# Patient Record
Sex: Female | Born: 1980 | Race: Black or African American | Hispanic: No | Smoking: Current some day smoker
Health system: Southern US, Community
[De-identification: ages and names within clinical notes are randomized; demographics above are authoritative.]

## PROBLEM LIST (undated history)

## (undated) DIAGNOSIS — T7840XA Allergy, unspecified, initial encounter: Secondary | ICD-10-CM

## (undated) DIAGNOSIS — R12 Heartburn: Secondary | ICD-10-CM

## (undated) DIAGNOSIS — F419 Anxiety disorder, unspecified: Secondary | ICD-10-CM

## (undated) DIAGNOSIS — G4730 Sleep apnea, unspecified: Secondary | ICD-10-CM

## (undated) DIAGNOSIS — F32A Depression, unspecified: Secondary | ICD-10-CM

## (undated) DIAGNOSIS — G43909 Migraine, unspecified, not intractable, without status migrainosus: Secondary | ICD-10-CM

## (undated) DIAGNOSIS — K219 Gastro-esophageal reflux disease without esophagitis: Secondary | ICD-10-CM

## (undated) HISTORY — PX: CHOLECYSTECTOMY: SHX55

## (undated) HISTORY — DX: Anxiety disorder, unspecified: F41.9

## (undated) HISTORY — DX: Allergy, unspecified, initial encounter: T78.40XA

## (undated) HISTORY — DX: Migraine, unspecified, not intractable, without status migrainosus: G43.909

## (undated) HISTORY — DX: Depression, unspecified: F32.A

## (undated) HISTORY — DX: Heartburn: R12

## (undated) HISTORY — PX: HERNIA REPAIR: SHX51

---

## 2014-12-22 HISTORY — PX: TUMOR REMOVAL: SHX12

## 2017-12-21 HISTORY — PX: ANTERIOR CRUCIATE LIGAMENT REPAIR: SHX115

## 2018-01-03 DIAGNOSIS — S83512A Sprain of anterior cruciate ligament of left knee, initial encounter: Secondary | ICD-10-CM | POA: Diagnosis not present

## 2018-01-05 DIAGNOSIS — M25562 Pain in left knee: Secondary | ICD-10-CM | POA: Diagnosis not present

## 2018-01-05 DIAGNOSIS — S83512A Sprain of anterior cruciate ligament of left knee, initial encounter: Secondary | ICD-10-CM | POA: Diagnosis not present

## 2018-01-05 DIAGNOSIS — M6588 Other synovitis and tenosynovitis, other site: Secondary | ICD-10-CM | POA: Diagnosis not present

## 2018-01-05 DIAGNOSIS — G8918 Other acute postprocedural pain: Secondary | ICD-10-CM | POA: Diagnosis not present

## 2018-01-05 DIAGNOSIS — Y929 Unspecified place or not applicable: Secondary | ICD-10-CM | POA: Diagnosis not present

## 2018-01-05 DIAGNOSIS — W19XXXA Unspecified fall, initial encounter: Secondary | ICD-10-CM | POA: Diagnosis not present

## 2018-01-07 DIAGNOSIS — S83512A Sprain of anterior cruciate ligament of left knee, initial encounter: Secondary | ICD-10-CM | POA: Diagnosis not present

## 2018-01-11 DIAGNOSIS — M7989 Other specified soft tissue disorders: Secondary | ICD-10-CM | POA: Diagnosis not present

## 2018-01-11 DIAGNOSIS — M79662 Pain in left lower leg: Secondary | ICD-10-CM | POA: Diagnosis not present

## 2018-01-13 DIAGNOSIS — S83512A Sprain of anterior cruciate ligament of left knee, initial encounter: Secondary | ICD-10-CM | POA: Diagnosis not present

## 2018-01-13 DIAGNOSIS — M25562 Pain in left knee: Secondary | ICD-10-CM | POA: Diagnosis not present

## 2018-01-14 DIAGNOSIS — S83512A Sprain of anterior cruciate ligament of left knee, initial encounter: Secondary | ICD-10-CM | POA: Diagnosis not present

## 2018-01-17 DIAGNOSIS — S83512A Sprain of anterior cruciate ligament of left knee, initial encounter: Secondary | ICD-10-CM | POA: Diagnosis not present

## 2018-01-17 DIAGNOSIS — M25562 Pain in left knee: Secondary | ICD-10-CM | POA: Diagnosis not present

## 2018-01-20 DIAGNOSIS — S83512D Sprain of anterior cruciate ligament of left knee, subsequent encounter: Secondary | ICD-10-CM | POA: Diagnosis not present

## 2018-01-20 DIAGNOSIS — M25562 Pain in left knee: Secondary | ICD-10-CM | POA: Diagnosis not present

## 2018-01-25 DIAGNOSIS — S83512D Sprain of anterior cruciate ligament of left knee, subsequent encounter: Secondary | ICD-10-CM | POA: Diagnosis not present

## 2018-01-27 DIAGNOSIS — M25562 Pain in left knee: Secondary | ICD-10-CM | POA: Diagnosis not present

## 2018-01-27 DIAGNOSIS — S83512D Sprain of anterior cruciate ligament of left knee, subsequent encounter: Secondary | ICD-10-CM | POA: Diagnosis not present

## 2018-01-31 DIAGNOSIS — S83512D Sprain of anterior cruciate ligament of left knee, subsequent encounter: Secondary | ICD-10-CM | POA: Diagnosis not present

## 2018-01-31 DIAGNOSIS — M25562 Pain in left knee: Secondary | ICD-10-CM | POA: Diagnosis not present

## 2018-02-03 DIAGNOSIS — S83512D Sprain of anterior cruciate ligament of left knee, subsequent encounter: Secondary | ICD-10-CM | POA: Diagnosis not present

## 2018-02-03 DIAGNOSIS — M25562 Pain in left knee: Secondary | ICD-10-CM | POA: Diagnosis not present

## 2018-02-08 DIAGNOSIS — S83512D Sprain of anterior cruciate ligament of left knee, subsequent encounter: Secondary | ICD-10-CM | POA: Diagnosis not present

## 2018-02-08 DIAGNOSIS — M25562 Pain in left knee: Secondary | ICD-10-CM | POA: Diagnosis not present

## 2018-02-22 DIAGNOSIS — S83512D Sprain of anterior cruciate ligament of left knee, subsequent encounter: Secondary | ICD-10-CM | POA: Diagnosis not present

## 2018-02-22 DIAGNOSIS — M25562 Pain in left knee: Secondary | ICD-10-CM | POA: Diagnosis not present

## 2018-02-24 DIAGNOSIS — S83512D Sprain of anterior cruciate ligament of left knee, subsequent encounter: Secondary | ICD-10-CM | POA: Diagnosis not present

## 2018-02-24 DIAGNOSIS — M25562 Pain in left knee: Secondary | ICD-10-CM | POA: Diagnosis not present

## 2018-03-01 DIAGNOSIS — M25562 Pain in left knee: Secondary | ICD-10-CM | POA: Diagnosis not present

## 2018-03-01 DIAGNOSIS — S83512D Sprain of anterior cruciate ligament of left knee, subsequent encounter: Secondary | ICD-10-CM | POA: Diagnosis not present

## 2018-03-08 DIAGNOSIS — M25562 Pain in left knee: Secondary | ICD-10-CM | POA: Diagnosis not present

## 2018-03-08 DIAGNOSIS — S83512D Sprain of anterior cruciate ligament of left knee, subsequent encounter: Secondary | ICD-10-CM | POA: Diagnosis not present

## 2018-03-10 DIAGNOSIS — S83512D Sprain of anterior cruciate ligament of left knee, subsequent encounter: Secondary | ICD-10-CM | POA: Diagnosis not present

## 2018-03-10 DIAGNOSIS — M25562 Pain in left knee: Secondary | ICD-10-CM | POA: Diagnosis not present

## 2018-03-17 DIAGNOSIS — M25562 Pain in left knee: Secondary | ICD-10-CM | POA: Diagnosis not present

## 2018-03-17 DIAGNOSIS — S83512D Sprain of anterior cruciate ligament of left knee, subsequent encounter: Secondary | ICD-10-CM | POA: Diagnosis not present

## 2018-03-31 DIAGNOSIS — S83512D Sprain of anterior cruciate ligament of left knee, subsequent encounter: Secondary | ICD-10-CM | POA: Diagnosis not present

## 2018-03-31 DIAGNOSIS — M25562 Pain in left knee: Secondary | ICD-10-CM | POA: Diagnosis not present

## 2018-05-13 DIAGNOSIS — S83512D Sprain of anterior cruciate ligament of left knee, subsequent encounter: Secondary | ICD-10-CM | POA: Diagnosis not present

## 2018-05-20 DIAGNOSIS — Z113 Encounter for screening for infections with a predominantly sexual mode of transmission: Secondary | ICD-10-CM | POA: Diagnosis not present

## 2018-05-20 DIAGNOSIS — Z01419 Encounter for gynecological examination (general) (routine) without abnormal findings: Secondary | ICD-10-CM | POA: Diagnosis not present

## 2018-05-20 DIAGNOSIS — J45909 Unspecified asthma, uncomplicated: Secondary | ICD-10-CM | POA: Insufficient documentation

## 2018-05-20 DIAGNOSIS — Z6841 Body Mass Index (BMI) 40.0 and over, adult: Secondary | ICD-10-CM | POA: Diagnosis not present

## 2018-05-23 DIAGNOSIS — E222 Syndrome of inappropriate secretion of antidiuretic hormone: Secondary | ICD-10-CM | POA: Insufficient documentation

## 2018-05-23 DIAGNOSIS — B977 Papillomavirus as the cause of diseases classified elsewhere: Secondary | ICD-10-CM | POA: Insufficient documentation

## 2018-05-26 DIAGNOSIS — R7303 Prediabetes: Secondary | ICD-10-CM | POA: Diagnosis not present

## 2018-06-08 DIAGNOSIS — Z6841 Body Mass Index (BMI) 40.0 and over, adult: Secondary | ICD-10-CM | POA: Diagnosis not present

## 2018-06-08 DIAGNOSIS — Z713 Dietary counseling and surveillance: Secondary | ICD-10-CM | POA: Diagnosis not present

## 2018-06-23 DIAGNOSIS — Z713 Dietary counseling and surveillance: Secondary | ICD-10-CM | POA: Diagnosis not present

## 2018-07-25 DIAGNOSIS — Z6841 Body Mass Index (BMI) 40.0 and over, adult: Secondary | ICD-10-CM | POA: Diagnosis not present

## 2018-07-25 DIAGNOSIS — Z713 Dietary counseling and surveillance: Secondary | ICD-10-CM | POA: Diagnosis not present

## 2019-02-20 DIAGNOSIS — S39012A Strain of muscle, fascia and tendon of lower back, initial encounter: Secondary | ICD-10-CM | POA: Diagnosis not present

## 2019-02-20 DIAGNOSIS — M62838 Other muscle spasm: Secondary | ICD-10-CM | POA: Diagnosis not present

## 2019-06-09 DIAGNOSIS — R030 Elevated blood-pressure reading, without diagnosis of hypertension: Secondary | ICD-10-CM | POA: Diagnosis not present

## 2019-06-09 DIAGNOSIS — Z13228 Encounter for screening for other metabolic disorders: Secondary | ICD-10-CM | POA: Diagnosis not present

## 2019-06-09 DIAGNOSIS — Z6841 Body Mass Index (BMI) 40.0 and over, adult: Secondary | ICD-10-CM | POA: Diagnosis not present

## 2019-06-09 DIAGNOSIS — Z30431 Encounter for routine checking of intrauterine contraceptive device: Secondary | ICD-10-CM | POA: Diagnosis not present

## 2019-06-09 DIAGNOSIS — Z113 Encounter for screening for infections with a predominantly sexual mode of transmission: Secondary | ICD-10-CM | POA: Diagnosis not present

## 2019-06-09 DIAGNOSIS — Z01419 Encounter for gynecological examination (general) (routine) without abnormal findings: Secondary | ICD-10-CM | POA: Diagnosis not present

## 2019-06-15 DIAGNOSIS — M5417 Radiculopathy, lumbosacral region: Secondary | ICD-10-CM | POA: Diagnosis not present

## 2019-08-08 DIAGNOSIS — H9201 Otalgia, right ear: Secondary | ICD-10-CM | POA: Diagnosis not present

## 2019-08-08 DIAGNOSIS — M26609 Unspecified temporomandibular joint disorder, unspecified side: Secondary | ICD-10-CM | POA: Diagnosis not present

## 2019-09-11 DIAGNOSIS — H9201 Otalgia, right ear: Secondary | ICD-10-CM | POA: Diagnosis not present

## 2019-09-11 DIAGNOSIS — K1123 Chronic sialoadenitis: Secondary | ICD-10-CM | POA: Diagnosis not present

## 2019-12-08 ENCOUNTER — Ambulatory Visit (INDEPENDENT_AMBULATORY_CARE_PROVIDER_SITE_OTHER): Payer: BC Managed Care – PPO | Admitting: Internal Medicine

## 2019-12-08 ENCOUNTER — Other Ambulatory Visit: Payer: Self-pay

## 2019-12-08 ENCOUNTER — Encounter: Payer: Self-pay | Admitting: Internal Medicine

## 2019-12-08 VITALS — BP 122/80 | HR 72 | Ht 63.0 in | Wt 226.2 lb

## 2019-12-08 DIAGNOSIS — G4700 Insomnia, unspecified: Secondary | ICD-10-CM | POA: Insufficient documentation

## 2019-12-08 DIAGNOSIS — K2950 Unspecified chronic gastritis without bleeding: Secondary | ICD-10-CM

## 2019-12-08 DIAGNOSIS — J4520 Mild intermittent asthma, uncomplicated: Secondary | ICD-10-CM | POA: Diagnosis not present

## 2019-12-08 DIAGNOSIS — F5101 Primary insomnia: Secondary | ICD-10-CM

## 2019-12-08 DIAGNOSIS — K2970 Gastritis, unspecified, without bleeding: Secondary | ICD-10-CM | POA: Insufficient documentation

## 2019-12-08 DIAGNOSIS — R197 Diarrhea, unspecified: Secondary | ICD-10-CM | POA: Diagnosis not present

## 2019-12-08 DIAGNOSIS — Z6841 Body Mass Index (BMI) 40.0 and over, adult: Secondary | ICD-10-CM

## 2019-12-08 MED ORDER — OMEPRAZOLE 20 MG PO CPDR: 20.0000 mg | DELAYED_RELEASE_CAPSULE | Freq: Every day | ORAL | 3 refills | Status: DC

## 2019-12-08 MED ORDER — TRAZODONE HCL 50 MG PO TABS: 25.0000 mg | ORAL_TABLET | Freq: Every evening | ORAL | 3 refills | Status: DC | PRN

## 2019-12-08 NOTE — Assessment & Plan Note (Signed)
Stable at present.   

## 2019-12-08 NOTE — Assessment & Plan Note (Signed)
Diarrhoea about 2-3 times a day.  There is no blood in the stool.  She does not take anything for that.  There is no history of hepatitis or HIV or any other infectious diseases in the past.  There is no family history of irritable bowel syndrome ulcerative colitis.  Or colon cancer.

## 2019-12-08 NOTE — Assessment & Plan Note (Signed)
-   I encouraged the patient to lose weight.  - I educated them on making healthy dietary choices including eating more fruits and vegetables and less fried foods. - I encouraged the patient to exercise more, and educated on the benefits of exercise including weight loss, diabetes prevention, and hypertension prevention.   

## 2019-12-08 NOTE — Assessment & Plan Note (Signed)
No h/o depression

## 2019-12-08 NOTE — Assessment & Plan Note (Signed)
Patient has a history of gastritis of the last 3 to 4 months.  She drinks a beer about 4 beers a day especially at night.  Occasionally take advil for migraine.  She also complains of diarrhea about 3 times a day.  There is no blood in her stool she denies any history of nausea and vomiting of blood.  She says she has lost about 7 pounds of weight.

## 2019-12-08 NOTE — Progress Notes (Signed)
New Patient Office Visit  Subjective:  Patient ID: Jo Soto, female    DOB: [birth year]  Age: 39 y.o. MRN: [id number]  CC:  Chief Complaint  Patient presents with  . New Patient (Initial Visit)    Diarrhea  This is a chronic problem. The current episode started 1 to 4 weeks ago. The problem occurs 2 to 4 times per day. The problem has been unchanged. The patient states that diarrhea does not awaken her from sleep. Associated symptoms include abdominal pain, headaches, increased flatus, sweats, vomiting and weight loss. Pertinent negatives include no arthralgias, bloating, chills, coughing, fever or myalgias. Risk factors include suspect food intake. She has tried anti-motility drug for the symptoms. The treatment provided mild relief.  Anxiety Presents for initial visit.     Patient presents for  Past Medical History:  Diagnosis Date  . Anxiety   . Depression   . Heartburn   . Migraines      Current Outpatient Medications:  .  CHLOROPHYLL PO, Take 1 tablet by mouth daily., Disp: , Rfl:  .  levonorgestrel (MIRENA) 20 MCG/24HR IUD, 1 each by Intrauterine route once., Disp: , Rfl:  .  Multiple Vitamin (DAILY-VITAMIN PO), Take 1 tablet by mouth daily., Disp: , Rfl:  .  omeprazole (PRILOSEC) 20 MG capsule, Take 1 capsule (20 mg total) by mouth daily., Disp: 30 capsule, Rfl: 3 .  traZODone (DESYREL) 50 MG tablet, Take 0.5-1 tablets (25-50 mg total) by mouth at bedtime as needed for sleep., Disp: 30 tablet, Rfl: 3    Family History  Problem Relation Age of Onset  . Anemia Mother   . Cancer Mother   . Diabetes Mother   . Heart disease Sister   . Diabetes Maternal Aunt   . Diabetes Maternal Uncle     Social History   Socioeconomic History  . Marital status: Legally Separated    Spouse name: Not on file  . Number of children: Not on file  . Years of education: Not on file  . Highest education level: Not on file  Occupational History    Employer: [employer]    Tobacco Use  . Smoking status: Former Smoker    Years: 8.00  . Smokeless tobacco: Never Used  Vaping Use  . Vaping Use: Never used  Substance and Sexual Activity  . Alcohol use: Yes    Comment: 2-3 weekly  . Drug use: Never  . Sexual activity: Yes  Other Topics Concern  . Not on file  Social History Narrative  . Not on file   Social Determinants of Health   Financial Resource Strain:   . Difficulty of Paying Living Expenses: Not on file  Food Insecurity:   . Worried About Programme researcher, broadcasting/film/video in the Last Year: Not on file  . Ran Out of Food in the Last Year: Not on file  Transportation Needs:   . Lack of Transportation (Medical): Not on file  . Lack of Transportation (Non-Medical): Not on file  Physical Activity:   . Days of Exercise per Week: Not on file  . Minutes of Exercise per Session: Not on file  Stress:   . Feeling of Stress : Not on file  Social Connections:   . Frequency of Communication with Friends and Family: Not on file  . Frequency of Social Gatherings with Friends and Family: Not on file  . Attends Religious Services: Not on file  . Active Member of Clubs or Organizations: Not on file  .  Attends Banker Meetings: Not on file  . Marital Status: Not on file  Intimate Partner Violence:   . Fear of Current or Ex-Partner: Not on file  . Emotionally Abused: Not on file  . Physically Abused: Not on file  . Sexually Abused: Not on file    ROS Review of Systems  Constitutional: Positive for weight loss. Negative for chills and fever.  Respiratory: Negative for cough.   Gastrointestinal: Positive for abdominal pain, diarrhea, flatus and vomiting. Negative for bloating.       Complain of getting sick after eating also complaining of nausea and diarrhea.  She has been using emitrol and Emetrol for nausea and abdominal discomfort.  Patient says she has not seen a doctor in the last year or so she lost 67 pounds of weight  Musculoskeletal: Negative  for arthralgias and myalgias.  Neurological: Positive for headaches.    Objective:   Today's Vitals: BP 122/80   Pulse 72   Ht 5\' 3"  (1.6 m)   Wt 226 lb 3.2 oz (102.6 kg)   BMI 40.07 kg/m   Physical Exam Constitutional:      Appearance: Normal appearance.  HENT:     Head: Atraumatic.     Right Ear: Tympanic membrane normal.     Left Ear: Tympanic membrane normal.     Nose: Nose normal.  Eyes:     Pupils: Pupils are equal, round, and reactive to light.  Neck:     Vascular: No carotid bruit.  Cardiovascular:     Rate and Rhythm: Normal rate.  Pulmonary:     Effort: No respiratory distress.  Abdominal:     General: There is no distension.     Palpations: There is no mass.     Tenderness: There is no abdominal tenderness. There is no guarding or rebound.     Hernia: No hernia is present.  Musculoskeletal:        General: No deformity.     Cervical back: No rigidity or tenderness.  Lymphadenopathy:     Cervical: No cervical adenopathy.  Skin:    General: Skin is warm.     Coloration: Skin is not jaundiced.  Neurological:     General: No focal deficit present.     Mental Status: She is alert.     Assessment & Plan:   Problem List Items Addressed This Visit      Respiratory   Mild intermittent asthma without complication    Stable at present        Digestive   Gastritis    Patient has a history of gastritis of the last 3 to 4 months.  She drinks a beer about 4 beers a day especially at night.  Occasionally take advil for migraine.  She also complains of diarrhea about 3 times a day.  There is no blood in her stool she denies any history of nausea and vomiting of blood.  She says she has lost about 7 pounds of weight.      Relevant Medications   omeprazole (PRILOSEC) 20 MG capsule     Other   Diarrhea - Primary    Diarrhoea about 2-3 times a day.  There is no blood in the stool.  She does not take anything for that.  There is no history of hepatitis or HIV  or any other infectious diseases in the past.  There is no family history of irritable bowel syndrome ulcerative colitis.  Or colon cancer.  Class 3 severe obesity due to excess calories without serious comorbidity with body mass index (BMI) of 40.0 to 44.9 in adult [facility])    - I encouraged the patient to lose weight.  - I educated them on making healthy dietary choices including eating more fruits and vegetables and less fried foods. - I encouraged the patient to exercise more, and educated on the benefits of exercise including weight loss, diabetes prevention, and hypertension prevention.        Primary insomnia    No h/o depression      Relevant Medications   traZODone (DESYREL) 50 MG tablet      Outpatient Encounter Medications as of 12/08/2019  Medication Sig  . CHLOROPHYLL PO Take 1 tablet by mouth daily.  Marland Kitchen levonorgestrel (MIRENA) 20 MCG/24HR IUD 1 each by Intrauterine route once.  . Multiple Vitamin (DAILY-VITAMIN PO) Take 1 tablet by mouth daily.  Marland Kitchen omeprazole (PRILOSEC) 20 MG capsule Take 1 capsule (20 mg total) by mouth daily.  . traZODone (DESYREL) 50 MG tablet Take 0.5-1 tablets (25-50 mg total) by mouth at bedtime as needed for sleep.   No facility-administered encounter medications on file as of 12/08/2019.    Follow-up: No follow-ups on file.   Corky Downs, MD

## 2019-12-30 ENCOUNTER — Other Ambulatory Visit: Payer: Self-pay | Admitting: Internal Medicine

## 2019-12-30 DIAGNOSIS — F5101 Primary insomnia: Secondary | ICD-10-CM

## 2020-01-15 ENCOUNTER — Other Ambulatory Visit: Payer: Self-pay | Admitting: Internal Medicine

## 2020-01-15 DIAGNOSIS — F5101 Primary insomnia: Secondary | ICD-10-CM

## 2020-01-30 ENCOUNTER — Other Ambulatory Visit: Payer: Self-pay

## 2020-01-30 ENCOUNTER — Encounter: Payer: Self-pay | Admitting: Adult Health

## 2020-01-30 ENCOUNTER — Ambulatory Visit (INDEPENDENT_AMBULATORY_CARE_PROVIDER_SITE_OTHER): Payer: BC Managed Care – PPO | Admitting: Adult Health

## 2020-01-30 DIAGNOSIS — F419 Anxiety disorder, unspecified: Secondary | ICD-10-CM

## 2020-01-30 DIAGNOSIS — G4700 Insomnia, unspecified: Secondary | ICD-10-CM

## 2020-01-30 DIAGNOSIS — R0683 Snoring: Secondary | ICD-10-CM

## 2020-01-30 DIAGNOSIS — K219 Gastro-esophageal reflux disease without esophagitis: Secondary | ICD-10-CM

## 2020-01-30 DIAGNOSIS — Z1389 Encounter for screening for other disorder: Secondary | ICD-10-CM

## 2020-01-30 DIAGNOSIS — E559 Vitamin D deficiency, unspecified: Secondary | ICD-10-CM | POA: Diagnosis not present

## 2020-01-30 DIAGNOSIS — G4710 Hypersomnia, unspecified: Secondary | ICD-10-CM

## 2020-01-30 LAB — POCT URINALYSIS DIPSTICK
Bilirubin, UA: NEGATIVE
Blood, UA: NEGATIVE
Glucose, UA: NEGATIVE
Ketones, UA: NEGATIVE
Leukocytes, UA: NEGATIVE
Nitrite, UA: NEGATIVE
Protein, UA: NEGATIVE
Spec Grav, UA: 1.01 (ref 1.010–1.025)
Urobilinogen, UA: 0.2 E.U./dL
pH, UA: 5 (ref 5.0–8.0)

## 2020-01-30 MED ORDER — ESCITALOPRAM OXALATE 10 MG PO TABS
10.0000 mg | ORAL_TABLET | Freq: Every day | ORAL | 0 refills | Status: DC
Start: 2020-01-30 — End: 2020-02-14

## 2020-01-30 MED ORDER — TRAZODONE HCL 100 MG PO TABS
50.0000 mg | ORAL_TABLET | Freq: Every evening | ORAL | 0 refills | Status: DC | PRN
Start: 2020-01-30 — End: 2020-02-14

## 2020-01-30 MED ORDER — OMEPRAZOLE 40 MG PO CPDR: 40.0000 mg | DELAYED_RELEASE_CAPSULE | Freq: Every day | ORAL | 0 refills | Status: DC

## 2020-01-30 NOTE — Patient Instructions (Addendum)
Insomnia Insomnia is a sleep disorder that makes it difficult to fall asleep or stay asleep. Insomnia can cause fatigue, low energy, difficulty concentrating, mood swings, and poor performance at work or school. There are three different ways to classify insomnia:  Difficulty falling asleep.  Difficulty staying asleep.  Waking up too early in the morning. Any type of insomnia can be long-term (chronic) or short-term (acute). Both are common. Short-term insomnia usually lasts for three months or less. Chronic insomnia occurs at least three times a week for longer than three months. What are the causes? Insomnia may be caused by another condition, situation, or substance, such as:  Anxiety.  Certain medicines.  Gastroesophageal reflux disease (GERD) or other gastrointestinal conditions.  Asthma or other breathing conditions.  Restless legs syndrome, sleep apnea, or other sleep disorders.  Chronic pain.  Menopause.  Stroke.  Abuse of alcohol, tobacco, or illegal drugs.  Mental health conditions, such as depression.  Caffeine.  Neurological disorders, such as Alzheimer's disease.  An overactive thyroid (hyperthyroidism). Sometimes, the cause of insomnia may not be known. What increases the risk? Risk factors for insomnia include:  Gender. Women are affected more often than men.  Age. Insomnia is more common as you get older.  Stress.  Lack of exercise.  Irregular work schedule or working night shifts.  Traveling between different time zones.  Certain medical and mental health conditions. What are the signs or symptoms? If you have insomnia, the main symptom is having trouble falling asleep or having trouble staying asleep. This may lead to other symptoms, such as:  Feeling fatigued or having low energy.  Feeling nervous about going to sleep.  Not feeling rested in the morning.  Having trouble concentrating.  Feeling irritable, anxious, or depressed. How  is this diagnosed? This condition may be diagnosed based on:  Your symptoms and medical history. Your health care provider may ask about: ? Your sleep habits. ? Any medical conditions you have. ? Your mental health.  A physical exam. How is this treated? Treatment for insomnia depends on the cause. Treatment may focus on treating an underlying condition that is causing insomnia. Treatment may also include:  Medicines to help you sleep.  Counseling or therapy.  Lifestyle adjustments to help you sleep better. Follow these instructions at home: Eating and drinking   Limit or avoid alcohol, caffeinated beverages, and cigarettes, especially close to bedtime. These can disrupt your sleep.  Do not eat a large meal or eat spicy foods right before bedtime. This can lead to digestive discomfort that can make it hard for you to sleep. Sleep habits   Keep a sleep diary to help you and your health care provider figure out what could be causing your insomnia. Write down: ? When you sleep. ? When you wake up during the night. ? How well you sleep. ? How rested you feel the next day. ? Any side effects of medicines you are taking. ? What you eat and drink.  Make your bedroom a dark, comfortable place where it is easy to fall asleep. ? Put up shades or blackout curtains to block light from outside. ? Use a white noise machine to block noise. ? Keep the temperature cool.  Limit screen use before bedtime. This includes: ? Watching TV. ? Using your smartphone, tablet, or computer.  Stick to a routine that includes going to bed and waking up at the same times every day and night. This can help you fall asleep faster. Consider   making a quiet activity, such as reading, part of your nighttime routine.  Try to avoid taking naps during the day so that you sleep better at night.  Get out of bed if you are still awake after 15 minutes of trying to sleep. Keep the lights down, but try reading or  doing a quiet activity. When you feel sleepy, go back to bed. General instructions  Take over-the-counter and prescription medicines only as told by your health care provider.  Exercise regularly, as told by your health care provider. Avoid exercise starting several hours before bedtime.  Use relaxation techniques to manage stress. Ask your health care provider to suggest some techniques that may work well for you. These may include: ? Breathing exercises. ? Routines to release muscle tension. ? Visualizing peaceful scenes.  Make sure that you drive carefully. Avoid driving if you feel very sleepy.  Keep all follow-up visits as told by your health care provider. This is important. Contact a health care provider if:  You are tired throughout the day.  You have trouble in your daily routine due to sleepiness.  You continue to have sleep problems, or your sleep problems get worse. Get help right away if:  You have serious thoughts about hurting yourself or someone else. If you ever feel like you may hurt yourself or others, or have thoughts about taking your own life, get help right away. You can go to your nearest emergency department or call:  Your local emergency services (911 in the [place]  A suicide crisis helpline, such as the National Suicide Prevention Lifeline at 719 273 0674. This is open 24 hours a day. Summary  Insomnia is a sleep disorder that makes it difficult to fall asleep or stay asleep.  Insomnia can be long-term (chronic) or short-term (acute).  Treatment for insomnia depends on the cause. Treatment may focus on treating an underlying condition that is causing insomnia.  Keep a sleep diary to help you and your health care provider figure out what could be causing your insomnia. This information is not intended to replace advice given to you by your health care provider. Make sure you discuss any questions you have with your health care provider. Document  Revised: 02/19/2017 Document Reviewed: 12/17/2016 Elsevier Patient Education  2020 [employer]    Escitalopram tablets What is this medicine? ESCITALOPRAM (es sye TAL oh pram) is used to treat depression and certain types of anxiety. This medicine may be used for other purposes; ask your health care provider or pharmacist if you have questions. COMMON BRAND NAME(S): Lexapro What should I tell my health care provider before I take this medicine? They need to know if you have any of these conditions:  bipolar disorder or a family history of bipolar disorder  diabetes  glaucoma  heart disease  kidney or liver disease  receiving electroconvulsive therapy  seizures (convulsions)  suicidal thoughts, plans, or attempt by you or a family member  an unusual or allergic reaction to escitalopram, the related drug citalopram, other medicines, foods, dyes, or preservatives  pregnant or trying to become pregnant  breast-feeding How should I use this medicine? Take this medicine by mouth with a glass of water. Follow the directions on the prescription label. You can take it with or without food. If it upsets your stomach, take it with food. Take your medicine at regular intervals. Do not take it more often than directed. Do not stop taking this medicine suddenly except upon the advice of your doctor.  Stopping this medicine too quickly may cause serious side effects or your condition may worsen. A special MedGuide will be given to you by the pharmacist with each prescription and refill. Be sure to read this information carefully each time. Talk to your pediatrician regarding the use of this medicine in children. Special care may be needed. Overdosage: If you think you have taken too much of this medicine contact a poison control center or emergency room at once. NOTE: This medicine is only for you. Do not share this medicine with others. What if I miss a dose? If you miss a dose, take it  as soon as you can. If it is almost time for your next dose, take only that dose. Do not take double or extra doses. What may interact with this medicine? Do not take this medicine with any of the following medications:  certain medicines for fungal infections like fluconazole, itraconazole, ketoconazole, posaconazole, voriconazole  cisapride  citalopram  dronedarone  linezolid  MAOIs like Carbex, Eldepryl, Marplan, Nardil, and Parnate  methylene blue (injected into a vein)  pimozide  thioridazine This medicine may also interact with the following medications:  alcohol  amphetamines  aspirin and aspirin-like medicines  carbamazepine  certain medicines for depression, anxiety, or psychotic disturbances  certain medicines for migraine headache like almotriptan, eletriptan, frovatriptan, naratriptan, rizatriptan, sumatriptan, zolmitriptan  certain medicines for sleep  certain medicines that treat or prevent blood clots like warfarin, enoxaparin, dalteparin  cimetidine  diuretics  dofetilide  fentanyl  furazolidone  isoniazid  lithium  metoprolol  NSAIDs, medicines for pain and inflammation, like ibuprofen or naproxen  other medicines that prolong the QT interval (cause an abnormal heart rhythm)  procarbazine  rasagiline  supplements like St. John's wort, kava kava, valerian  tramadol  tryptophan  ziprasidone This list may not describe all possible interactions. Give your health care provider a list of all the medicines, herbs, non-prescription drugs, or dietary supplements you use. Also tell them if you smoke, drink alcohol, or use illegal drugs. Some items may interact with your medicine. What should I watch for while using this medicine? Tell your doctor if your symptoms do not get better or if they get worse. Visit your doctor or health care professional for regular checks on your progress. Because it may take several weeks to see the full  effects of this medicine, it is important to continue your treatment as prescribed by your doctor. Patients and their families should watch out for new or worsening thoughts of suicide or depression. Also watch out for sudden changes in feelings such as feeling anxious, agitated, panicky, irritable, hostile, aggressive, impulsive, severely restless, overly excited and hyperactive, or not being able to sleep. If this happens, especially at the beginning of treatment or after a change in dose, call your health care professional. Bonita Quin may get drowsy or dizzy. Do not drive, use machinery, or do anything that needs mental alertness until you know how this medicine affects you. Do not stand or sit up quickly, especially if you are an older patient. This reduces the risk of dizzy or fainting spells. Alcohol may interfere with the effect of this medicine. Avoid alcoholic drinks. Your mouth may get dry. Chewing sugarless gum or sucking hard candy, and drinking plenty of water may help. Contact your doctor if the problem does not go away or is severe. What side effects may I notice from receiving this medicine? Side effects that you should report to your doctor or health care  professional as soon as possible:  allergic reactions like skin rash, itching or hives, swelling of the face, lips, or tongue  anxious  black, tarry stools  changes in vision  confusion  elevated mood, decreased need for sleep, racing thoughts, impulsive behavior  eye pain  fast, irregular heartbeat  feeling faint or lightheaded, falls  feeling agitated, angry, or irritable  hallucination, loss of contact with reality  loss of balance or coordination  loss of memory  painful or prolonged erections  restlessness, pacing, inability to keep still  seizures  stiff muscles  suicidal thoughts or other mood changes  trouble sleeping  unusual bleeding or bruising  unusually weak or tired  vomiting Side effects  that usually do not require medical attention (report to your doctor or health care professional if they continue or are bothersome):  changes in appetite  change in sex drive or performance  headache  increased sweating  indigestion, nausea  tremors This list may not describe all possible side effects. Call your doctor for medical advice about side effects. You may report side effects to FDA at 1-800-FDA-1088. Where should I keep my medicine? Keep out of reach of children. Store at room temperature between 15 and 30 degrees C (59 and 86 degrees F). Throw away any unused medicine after the expiration date. NOTE: This sheet is a summary. It may not cover all possible information. If you have questions about this medicine, talk to your doctor, pharmacist, or health care provider.  2020 Elsevier/Gold Standard (2018-02-28 11:21:44)  Insomnia Insomnia is a sleep disorder that makes it difficult to fall asleep or stay asleep. Insomnia can cause fatigue, low energy, difficulty concentrating, mood swings, and poor performance at work or school. There are three different ways to classify insomnia:  Difficulty falling asleep.  Difficulty staying asleep.  Waking up too early in the morning. Any type of insomnia can be long-term (chronic) or short-term (acute). Both are common. Short-term insomnia usually lasts for three months or less. Chronic insomnia occurs at least three times a week for longer than three months. What are the causes? Insomnia may be caused by another condition, situation, or substance, such as:  Anxiety.  Certain medicines.  Gastroesophageal reflux disease (GERD) or other gastrointestinal conditions.  Asthma or other breathing conditions.  Restless legs syndrome, sleep apnea, or other sleep disorders.  Chronic pain.  Menopause.  Stroke.  Abuse of alcohol, tobacco, or illegal drugs.  Mental health conditions, such as depression.  Caffeine.  Neurological  disorders, such as Alzheimer's disease.  An overactive thyroid (hyperthyroidism). Sometimes, the cause of insomnia may not be known. What increases the risk? Risk factors for insomnia include:  Gender. Women are affected more often than men.  Age. Insomnia is more common as you get older.  Stress.  Lack of exercise.  Irregular work schedule or working night shifts.  Traveling between different time zones.  Certain medical and mental health conditions. What are the signs or symptoms? If you have insomnia, the main symptom is having trouble falling asleep or having trouble staying asleep. This may lead to other symptoms, such as:  Feeling fatigued or having low energy.  Feeling nervous about going to sleep.  Not feeling rested in the morning.  Having trouble concentrating.  Feeling irritable, anxious, or depressed. How is this diagnosed? This condition may be diagnosed based on:  Your symptoms and medical history. Your health care provider may ask about: ? Your sleep habits. ? Any medical conditions you have. ?  Your mental health.  A physical exam. How is this treated? Treatment for insomnia depends on the cause. Treatment may focus on treating an underlying condition that is causing insomnia. Treatment may also include:  Medicines to help you sleep.  Counseling or therapy.  Lifestyle adjustments to help you sleep better. Follow these instructions at home: Eating and drinking   Limit or avoid alcohol, caffeinated beverages, and cigarettes, especially close to bedtime. These can disrupt your sleep.  Do not eat a large meal or eat spicy foods right before bedtime. This can lead to digestive discomfort that can make it hard for you to sleep. Sleep habits   Keep a sleep diary to help you and your health care provider figure out what could be causing your insomnia. Write down: ? When you sleep. ? When you wake up during the night. ? How well you sleep. ? How  rested you feel the next day. ? Any side effects of medicines you are taking. ? What you eat and drink.  Make your bedroom a dark, comfortable place where it is easy to fall asleep. ? Put up shades or blackout curtains to block light from outside. ? Use a white noise machine to block noise. ? Keep the temperature cool.  Limit screen use before bedtime. This includes: ? Watching TV. ? Using your smartphone, tablet, or computer.  Stick to a routine that includes going to bed and waking up at the same times every day and night. This can help you fall asleep faster. Consider making a quiet activity, such as reading, part of your nighttime routine.  Try to avoid taking naps during the day so that you sleep better at night.  Get out of bed if you are still awake after 15 minutes of trying to sleep. Keep the lights down, but try reading or doing a quiet activity. When you feel sleepy, go back to bed. General instructions  Take over-the-counter and prescription medicines only as told by your health care provider.  Exercise regularly, as told by your health care provider. Avoid exercise starting several hours before bedtime.  Use relaxation techniques to manage stress. Ask your health care provider to suggest some techniques that may work well for you. These may include: ? Breathing exercises. ? Routines to release muscle tension. ? Visualizing peaceful scenes.  Make sure that you drive carefully. Avoid driving if you feel very sleepy.  Keep all follow-up visits as told by your health care provider. This is important. Contact a health care provider if:  You are tired throughout the day.  You have trouble in your daily routine due to sleepiness.  You continue to have sleep problems, or your sleep problems get worse. Get help right away if:  You have serious thoughts about hurting yourself or someone else. If you ever feel like you may hurt yourself or others, or have thoughts about  taking your own life, get help right away. You can go to your nearest emergency department or call:  Your local emergency services (911 in the [place]  A suicide crisis helpline, such as the National Suicide Prevention Lifeline at 765-265-61211-213-649-2623. This is open 24 hours a day. Summary  Insomnia is a sleep disorder that makes it difficult to fall asleep or stay asleep.  Insomnia can be long-term (chronic) or short-term (acute).  Treatment for insomnia depends on the cause. Treatment may focus on treating an underlying condition that is causing insomnia.  Keep a sleep diary to help you and  your health care provider figure out what could be causing your insomnia. This information is not intended to replace advice given to you by your health care provider. Make sure you discuss any questions you have with your health care provider. Document Revised: 02/19/2017 Document Reviewed: 12/17/2016 Elsevier Patient Education  2020 [employer]. Trazodone tablets What is this medicine? TRAZODONE (TRAZ oh done) is used to treat depression. This medicine may be used for other purposes; ask your health care provider or pharmacist if you have questions. COMMON BRAND NAME(S): Desyrel What should I tell my health care provider before I take this medicine? They need to know if you have any of these conditions:  attempted suicide or thinking about it  bipolar disorder  bleeding problems  glaucoma  heart disease, or previous heart attack  irregular heart beat  kidney or liver disease  low levels of sodium in the blood  an unusual or allergic reaction to trazodone, other medicines, foods, dyes or preservatives  pregnant or trying to get pregnant  breast-feeding How should I use this medicine? Take this medicine by mouth with a glass of water. Follow the directions on the prescription label. Take this medicine shortly after a meal or a light snack. Take your medicine at regular intervals. Do not  take your medicine more often than directed. Do not stop taking this medicine suddenly except upon the advice of your doctor. Stopping this medicine too quickly may cause serious side effects or your condition may worsen. A special MedGuide will be given to you by the pharmacist with each prescription and refill. Be sure to read this information carefully each time. Talk to your pediatrician regarding the use of this medicine in children. Special care may be needed. Overdosage: If you think you have taken too much of this medicine contact a poison control center or emergency room at once. NOTE: This medicine is only for you. Do not share this medicine with others. What if I miss a dose? If you miss a dose, take it as soon as you can. If it is almost time for your next dose, take only that dose. Do not take double or extra doses. What may interact with this medicine? Do not take this medicine with any of the following medications:  certain medicines for fungal infections like fluconazole, itraconazole, ketoconazole, posaconazole, voriconazole  cisapride  dronedarone  linezolid  MAOIs like Carbex, Eldepryl, Marplan, Nardil, and Parnate  mesoridazine  methylene blue (injected into a vein)  pimozide  saquinavir  thioridazine This medicine may also interact with the following medications:  alcohol  antiviral medicines for HIV or AIDS  aspirin and aspirin-like medicines  barbiturates like phenobarbital  certain medicines for blood pressure, heart disease, irregular heart beat  certain medicines for depression, anxiety, or psychotic disturbances  certain medicines for migraine headache like almotriptan, eletriptan, frovatriptan, naratriptan, rizatriptan, sumatriptan, zolmitriptan  certain medicines for seizures like carbamazepine and phenytoin  certain medicines for sleep  certain medicines that treat or prevent blood clots like dalteparin, enoxaparin,  warfarin  digoxin  fentanyl  lithium  NSAIDS, medicines for pain and inflammation, like ibuprofen or naproxen  other medicines that prolong the QT interval (cause an abnormal heart rhythm) like dofetilide  rasagiline  supplements like St. John's wort, kava kava, valerian  tramadol  tryptophan This list may not describe all possible interactions. Give your health care provider a list of all the medicines, herbs, non-prescription drugs, or dietary supplements you use. Also tell them if you  smoke, drink alcohol, or use illegal drugs. Some items may interact with your medicine. What should I watch for while using this medicine? Tell your doctor if your symptoms do not get better or if they get worse. Visit your doctor or health care professional for regular checks on your progress. Because it may take several weeks to see the full effects of this medicine, it is important to continue your treatment as prescribed by your doctor. Patients and their families should watch out for new or worsening thoughts of suicide or depression. Also watch out for sudden changes in feelings such as feeling anxious, agitated, panicky, irritable, hostile, aggressive, impulsive, severely restless, overly excited and hyperactive, or not being able to sleep. If this happens, especially at the beginning of treatment or after a change in dose, call your health care professional. Bonita Quin may get drowsy or dizzy. Do not drive, use machinery, or do anything that needs mental alertness until you know how this medicine affects you. Do not stand or sit up quickly, especially if you are an older patient. This reduces the risk of dizzy or fainting spells. Alcohol may interfere with the effect of this medicine. Avoid alcoholic drinks. This medicine may cause dry eyes and blurred vision. If you wear contact lenses you may feel some discomfort. Lubricating drops may help. See your eye doctor if the problem does not go away or is  severe. Your mouth may get dry. Chewing sugarless gum, sucking hard candy and drinking plenty of water may help. Contact your doctor if the problem does not go away or is severe. What side effects may I notice from receiving this medicine? Side effects that you should report to your doctor or health care professional as soon as possible:  allergic reactions like skin rash, itching or hives, swelling of the face, lips, or tongue  elevated mood, decreased need for sleep, racing thoughts, impulsive behavior  confusion  fast, irregular heartbeat  feeling faint or lightheaded, falls  feeling agitated, angry, or irritable  loss of balance or coordination  painful or prolonged erections  restlessness, pacing, inability to keep still  suicidal thoughts or other mood changes  tremors  trouble sleeping  seizures  unusual bleeding or bruising Side effects that usually do not require medical attention (report to your doctor or health care professional if they continue or are bothersome):  change in sex drive or performance  change in appetite or weight  constipation  headache  muscle aches or pains  nausea This list may not describe all possible side effects. Call your doctor for medical advice about side effects. You may report side effects to FDA at 1-800-FDA-1088. Where should I keep my medicine? Keep out of the reach of children. Store at room temperature between 15 and 30 degrees C (59 to 86 degrees F). Protect from light. Keep container tightly closed. Throw away any unused medicine after the expiration date. NOTE: This sheet is a summary. It may not cover all possible information. If you have questions about this medicine, talk to your doctor, pharmacist, or health care provider.  2020 Elsevier/Gold Standard (2018-03-01 11:46:46)

## 2020-01-30 NOTE — Progress Notes (Signed)
Within normal limits urine.

## 2020-01-30 NOTE — Progress Notes (Signed)
New patient visit   Patient: Jo Soto   DOB: [birth year]   39 y.o. Female  MRN: [id number] Visit Date: 01/30/2020  Today's healthcare provider: Jairo Ben, FNP   Chief Complaint  Patient presents with  . New Patient (Initial Visit)   Subjective    Jo Soto is a 39 y.o. female who presents today as a new patient to establish care.  HPI  Patient reports that she feels fairly well today but would like to address some concerns. Patient reports that in July she lost her nephew in a tragic way and since his death has had difficulty with sleeping. She reports that the sleeping issues has happened since she lost her nephew.  She is currently trying Trazadone 50mg  and has not noticed an improved over the last 4 weeks. She is anxious and depressed she reports and wants to try antidepressant.  She has not taken in past.  Calms Forte worked for her in past. Melatonin keeps her awake.    She has a lot of snoring at night and feels she may have sleep apnea. She does not feel rested during the night.  She does notice increase in GERD, denies hemoptysis, has been worse acid with stress. Has diarrhea with nerves intermittent.   Patient states on average she sleeps 4-5hrs a night, for the past 30 days or more patient states that she has also had  Difficulty eating.   On average patient reports eating up to one meal a day. She denies any suicidal or homicidal ideas or intents now or in past.  ACL left knee, and she had repaired October 2019 still occasional has pain in that knee , no injury to it, She was released from orthopedics. Denies any need for follow up with her orthopedics no change since last saw.    Patient reports that she has a gynecologist that she sees at [facility] and reports that her last pap was in  the past 3 years which was normal, patient denies history of abnormal pap. She reports she has seen them this year 2021 and had a normal PAP smear.     Patient  denies any fever, body aches,chills, rash, chest pain, shortness of breath, nausea, vomiting, or diarrhea.    Past Medical History:  Diagnosis Date  . Allergy   . Anxiety   . Depression   . Heartburn   . Migraines    Past Surgical History:  Procedure Laterality Date  . ANTERIOR CRUCIATE LIGAMENT REPAIR  12/2017  . 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 Mother  (Not Specified)  . Sister  (Not Specified)  . Mat Aunt  (Not Specified)  . Mat Uncle  (Not Specified)   Family History  Problem Relation Age of Onset  . Anemia Mother   . Cancer Mother   . Diabetes Mother   . Heart disease Sister   . Diabetes Maternal Aunt   . Diabetes Maternal Uncle    Social History   Socioeconomic History  . Marital status: Legally Separated    Spouse name: Not on file  . Number of children: Not on file  . Years of education: Not on file  . Highest education level: Not on file  Occupational History    Employer: [employer]  Tobacco Use  . Smoking status: Former Smoker    Years: 8.00  . Smokeless tobacco: Never Used  Vaping Use  . Vaping Use:  Never used  Substance and Sexual Activity  . Alcohol use: Yes    Alcohol/week: 9.0 standard drinks    Types: 2 Glasses of wine, 6 Cans of beer, 1 Shots of liquor per week    Comment: 2-3 weekly  . Drug use: Never  . Sexual activity: Yes  Other Topics Concern  . Not on file  Social History Narrative  . Not on file   Social Determinants of Health   Financial Resource Strain:   . Difficulty of Paying Living Expenses: Not on file  Food Insecurity:   . Worried About Programme researcher, broadcasting/film/videounning Out of Food in the Last Year: Not on file  . Ran Out of Food in the Last Year: Not on file  Transportation Needs:   . Lack of Transportation (Medical): Not on file  . Lack of Transportation (Non-Medical): Not on file  Physical Activity:   . Days of Exercise per Week: Not on file  . Minutes of Exercise per Session: Not  on file  Stress:   . Feeling of Stress : Not on file  Social Connections:   . Frequency of Communication with Friends and Family: Not on file  . Frequency of Social Gatherings with Friends and Family: Not on file  . Attends Religious Services: Not on file  . Active Member of Clubs or Organizations: Not on file  . Attends BankerClub or Organization Meetings: Not on file  . Marital Status: Not on file   Outpatient Medications Prior to Visit  Medication Sig  . CHLOROPHYLL PO Take 1 tablet by mouth daily.  Marland Kitchen. levonorgestrel (MIRENA) 20 MCG/24HR IUD 1 each by Intrauterine route once.  . Multiple Vitamin (DAILY-VITAMIN PO) Take 1 tablet by mouth daily.  . [DISCONTINUED] omeprazole (PRILOSEC) 20 MG capsule Take 1 capsule (20 mg total) by mouth daily.  . [DISCONTINUED] traZODone (DESYREL) 50 MG tablet TAKE 0.5-1 TABLETS BY MOUTH AT BEDTIME AS NEEDED FOR SLEEP.   No facility-administered medications prior to visit.   No Known Allergies   There is no immunization history on file for this patient.  Health Maintenance  Topic Date Due  . Hepatitis C Screening  Never done  . COVID-19 Vaccine (1) Never done  . HIV Screening  Never done  . TETANUS/TDAP  Never done  . PAP SMEAR-Modifier  Never done  . INFLUENZA VACCINE  Never done    Patient Care Team: Jarin Cornfield, Eula FriedMichelle S, FNP as PCP - General (Family Medicine)  Review of Systems  Constitutional: Positive for appetite change and fatigue. Negative for activity change, chills, diaphoresis, fever and unexpected weight change.  HENT: Negative.   Respiratory: Negative.        Snoring loud at night- frequent nighttime awakening.   Cardiovascular: Positive for leg swelling.  Gastrointestinal: Positive for diarrhea, nausea and vomiting.  Genitourinary: Negative.   Musculoskeletal: Positive for arthralgias. Negative for back pain, gait problem, joint swelling, myalgias, neck pain and neck stiffness.  Skin: Negative.   Allergic/Immunologic: Positive  for environmental allergies.  Neurological: Positive for headaches.  Hematological: Negative.   Psychiatric/Behavioral: Positive for decreased concentration and sleep disturbance. Negative for agitation, behavioral problems, confusion, dysphoric mood, hallucinations, self-injury and suicidal ideas. The patient is nervous/anxious. The patient is not hyperactive.   All other systems reviewed and are negative.   Last CBC Lab Results  Component Value Date   WBC 5.5 01/30/2020   HGB 13.3 01/30/2020   HCT 40.5 01/30/2020   MCV 88 01/30/2020   MCH 29.0 01/30/2020   RDW  14.2 01/30/2020   PLT 272 01/30/2020   Last metabolic panel Lab Results  Component Value Date   GLUCOSE 98 01/30/2020   NA 137 01/30/2020   K 4.3 01/30/2020   CL 103 01/30/2020   CO2 22 01/30/2020   BUN 10 01/30/2020   CREATININE 0.99 01/30/2020   GFRNONAA 73 01/30/2020   GFRAA 84 01/30/2020   CALCIUM 9.3 01/30/2020   PROT 7.0 01/30/2020   ALBUMIN 4.4 01/30/2020   LABGLOB 2.6 01/30/2020   AGRATIO 1.7 01/30/2020   BILITOT 0.4 01/30/2020   ALKPHOS 71 01/30/2020   AST 21 01/30/2020   ALT 21 01/30/2020   Last lipids Lab Results  Component Value Date   CHOL 194 01/30/2020   HDL 63 01/30/2020   LDLCALC 112 (H) 01/30/2020   TRIG 107 01/30/2020   CHOLHDL 3.1 01/30/2020   Last hemoglobin A1c No results found for: HGBA1C Last thyroid functions Lab Results  Component Value Date   TSH 1.140 01/30/2020   Last vitamin D Lab Results  Component Value Date   VD25OH 20.3 (L) 01/30/2020   Last vitamin B12 and Folate No results found for: VITAMINB12, FOLATE    Objective    There were no vitals taken for this visit. Physical Exam Vitals reviewed.  Constitutional:      General: She is not in acute distress.    Appearance: Normal appearance. She is well-developed. She is obese. She is not ill-appearing, toxic-appearing or diaphoretic.     Interventions: She is not intubated.    Comments: Patient is alert and  oriented and responsive to questions Engages in eye contact with provider. Speaks in full sentences without any pauses without any shortness of breath or distress.    HENT:     Head: Normocephalic and atraumatic.     Right Ear: Tympanic membrane, ear canal and external ear normal. There is no impacted cerumen.     Left Ear: Tympanic membrane, ear canal and external ear normal. There is no impacted cerumen.     Nose: Nose normal. No congestion or rhinorrhea.     Mouth/Throat:     Mouth: Mucous membranes are moist.     Pharynx: No oropharyngeal exudate or posterior oropharyngeal erythema.  Eyes:     General: Lids are normal. No scleral icterus.       Right eye: No discharge.        Left eye: No discharge.     Extraocular Movements: Extraocular movements intact.     Conjunctiva/sclera: Conjunctivae normal.     Right eye: Right conjunctiva is not injected. No exudate or hemorrhage.    Left eye: Left conjunctiva is not injected. No exudate or hemorrhage.    Pupils: Pupils are equal, round, and reactive to light.  Neck:     Thyroid: No thyroid mass or thyromegaly.     Vascular: Normal carotid pulses. No carotid bruit, hepatojugular reflux or JVD.     Trachea: Trachea and phonation normal. No tracheal tenderness or tracheal deviation.     Meningeal: Brudzinski's sign and Kernig's sign absent.  Cardiovascular:     Rate and Rhythm: Normal rate and regular rhythm.     Pulses: Normal pulses.          Radial pulses are 2+ on the right side and 2+ on the left side.       Dorsalis pedis pulses are 2+ on the right side and 2+ on the left side.       Posterior tibial pulses are 2+  on the right side and 2+ on the left side.     Heart sounds: Normal heart sounds, S1 normal and S2 normal. Heart sounds not distant. No murmur heard.  No friction rub. No gallop.   Pulmonary:     Effort: Pulmonary effort is normal. No tachypnea, bradypnea, accessory muscle usage or respiratory distress. She is not  intubated.     Breath sounds: Normal breath sounds. No stridor. No wheezing, rhonchi or rales.  Chest:     Chest wall: No tenderness.  Abdominal:     General: Bowel sounds are normal. There is no distension or abdominal bruit.     Palpations: Abdomen is soft. There is no shifting dullness, fluid wave, hepatomegaly, splenomegaly, mass or pulsatile mass.     Tenderness: There is no abdominal tenderness. There is no right CVA tenderness, left CVA tenderness, guarding or rebound.     Hernia: No hernia is present.  Genitourinary:    Comments: deferred to gynecology.  Musculoskeletal:        General: No swelling, tenderness, deformity or signs of injury. Normal range of motion.     Cervical back: Full passive range of motion without pain, normal range of motion and neck supple. No edema, erythema, rigidity or tenderness. No spinous process tenderness or muscular tenderness. Normal range of motion.     Right lower leg: No edema.     Left lower leg: No edema.  Lymphadenopathy:     Head:     Right side of head: No submental, submandibular, tonsillar, preauricular, posterior auricular or occipital adenopathy.     Left side of head: No submental, submandibular, tonsillar, preauricular, posterior auricular or occipital adenopathy.     Cervical: No cervical adenopathy.     Right cervical: No superficial, deep or posterior cervical adenopathy.    Left cervical: No superficial, deep or posterior cervical adenopathy.     Upper Body:     Right upper body: No supraclavicular or pectoral adenopathy.     Left upper body: No supraclavicular or pectoral adenopathy.  Skin:    General: Skin is warm and dry.     Capillary Refill: Capillary refill takes less than 2 seconds.     Coloration: Skin is not jaundiced or pale.     Findings: No abrasion, bruising, burn, ecchymosis, erythema, lesion, petechiae or rash.     Nails: There is no clubbing.  Neurological:     General: No focal deficit present.     Mental  Status: She is alert and oriented to person, place, and time.     GCS: GCS eye subscore is 4. GCS verbal subscore is 5. GCS motor subscore is 6.     Cranial Nerves: No cranial nerve deficit.     Sensory: No sensory deficit.     Motor: No weakness, tremor, atrophy, abnormal muscle tone or seizure activity.     Coordination: Coordination normal.     Gait: Gait normal.     Deep Tendon Reflexes: Reflexes are normal and symmetric. Reflexes normal. Babinski sign absent on the right side. Babinski sign absent on the left side.     Reflex Scores:      Tricep reflexes are 2+ on the right side and 2+ on the left side.      Bicep reflexes are 2+ on the right side and 2+ on the left side.      Brachioradialis reflexes are 2+ on the right side and 2+ on the left side.  Patellar reflexes are 2+ on the right side and 2+ on the left side.      Achilles reflexes are 2+ on the right side and 2+ on the left side. Psychiatric:        Mood and Affect: Mood normal.        Speech: Speech normal.        Behavior: Behavior normal.        Thought Content: Thought content normal.        Judgment: Judgment normal.     Depression Screen PHQ 2/9 Scores 01/30/2020  PHQ - 2 Score 3  PHQ- 9 Score 12   Results for orders placed or performed in visit on 01/30/20  CBC with Differential/Platelet  Result Value Ref Range   WBC 5.5 3.4 - 10.8 x10E3/uL   RBC 4.59 3.77 - 5.28 x10E6/uL   Hemoglobin 13.3 11.1 - 15.9 g/dL   Hematocrit 80.9 98.3 - 46.6 %   MCV 88 79 - 97 fL   MCH 29.0 26.6 - 33.0 pg   MCHC 32.8 31 - 35 g/dL   RDW 38.2 50.5 - 39.7 %   Platelets 272 150 - 450 x10E3/uL   Neutrophils 68 Not Estab. %   Lymphs 19 Not Estab. %   Monocytes 9 Not Estab. %   Eos 2 Not Estab. %   Basos 1 Not Estab. %   Neutrophils Absolute 3.8 1.40 - 7.00 x10E3/uL   Lymphocytes Absolute 1.0 0 - 3 x10E3/uL   Monocytes Absolute 0.5 0 - 0 x10E3/uL   EOS (ABSOLUTE) 0.1 0.0 - 0.4 x10E3/uL   Basophils Absolute 0.0 0 - 0  x10E3/uL   Immature Granulocytes 1 Not Estab. %   Immature Grans (Abs) 0.0 0.0 - 0.1 x10E3/uL  Comprehensive metabolic panel  Result Value Ref Range   Glucose 98 65 - 99 mg/dL   BUN 10 6 - 20 mg/dL   Creatinine, Ser 6.73 0.57 - 1.00 mg/dL   GFR calc non Af Amer 73 >59 mL/min/1.73   GFR calc Af Amer 84 >59 mL/min/1.73   BUN/Creatinine Ratio 10 9 - 23   Sodium 137 134 - 144 mmol/L   Potassium 4.3 3.5 - 5.2 mmol/L   Chloride 103 96 - 106 mmol/L   CO2 22 20 - 29 mmol/L   Calcium 9.3 8.7 - 10.2 mg/dL   Total Protein 7.0 6.0 - 8.5 g/dL   Albumin 4.4 3.8 - 4.8 g/dL   Globulin, Total 2.6 1.5 - 4.5 g/dL   Albumin/Globulin Ratio 1.7 1.2 - 2.2   Bilirubin Total 0.4 0.0 - 1.2 mg/dL   Alkaline Phosphatase 71 44 - 121 IU/L   AST 21 0 - 40 IU/L   ALT 21 0 - 32 IU/L  Lipid panel  Result Value Ref Range   Cholesterol, Total 194 100 - 199 mg/dL   Triglycerides 419 0 - 149 mg/dL   HDL 63 >37 mg/dL   VLDL Cholesterol Cal 19 5 - 40 mg/dL   LDL Chol Calc (NIH) 902 (H) 0 - 99 mg/dL   Chol/HDL Ratio 3.1 0.0 - 4.4 ratio  TSH  Result Value Ref Range   TSH 1.140 0.450 - 4.500 uIU/mL  VITAMIN D 25 Hydroxy (Vit-D Deficiency, Fractures)  Result Value Ref Range   Vit D, 25-Hydroxy 20.3 (L) 30.0 - 100.0 ng/mL  POCT urinalysis dipstick  Result Value Ref Range   Color, UA yellow    Clarity, UA clear    Glucose, UA Negative Negative  Bilirubin, UA negative    Ketones, UA negative    Spec Grav, UA 1.010 1.010 - 1.025   Blood, UA negative    pH, UA 5.0 5.0 - 8.0   Protein, UA Negative Negative   Urobilinogen, UA 0.2 0.2 or 1.0 E.U./dL   Nitrite, UA negative    Leukocytes, UA Negative Negative   Appearance     Odor      Assessment & Plan       Anxiety - Plan: CBC with Differential/Platelet, Comprehensive metabolic panel, Lipid panel, TSH  Vitamin D insufficiency - Plan: VITAMIN D 25 Hydroxy (Vit-D Deficiency, Fractures)  Gastroesophageal reflux disease without esophagitis - Plan: omeprazole  (PRILOSEC) 40 MG capsule  Screening for blood or protein in urine - Plan: POCT urinalysis dipstick  Insomnia, unspecified type  Snoring - Plan: Ambulatory referral to Sleep Studies  Excessive sleepiness - Plan: Ambulatory referral to Sleep Studies  Meds ordered this encounter  Medications  . escitalopram (LEXAPRO) 10 MG tablet    Sig: Take 1 tablet (10 mg total) by mouth daily.    Dispense:  30 tablet    Refill:  0  . traZODone (DESYREL) 100 MG tablet    Sig: Take 0.5-1 tablets (50-100 mg total) by mouth at bedtime as needed for sleep (use lowest effective dose.).    Dispense:  30 tablet    Refill:  0  . omeprazole (PRILOSEC) 40 MG capsule    Sig: Take 1 capsule (40 mg total) by mouth daily.    Dispense:  30 capsule    Refill:  0  Will increase Trazodone since she has had no response with 25-50 mg for past 4 weeks with exp[ectation to decrease/ discontinue as Lexapro takes effect.  History of GERD, feeling more acid with stress, will increase to Prilosec for one month to 40 mg. Follow up in one month, sooner if needed. GI referral if persist or if any red flags  She will add Metamucil nightly to her regimen.    Discussed known black box warning for anti depression/ anxiety medication. Need to report any behavioral changes right, if any homicidal or suicidal thoughts or ideas seek medical attention right away. Call 911.   Red Flags discussed. The patient was given clear instructions to go to ER or return to medical center if any red flags develop, symptoms do not improve, worsen or new problems develop. They verbalized understanding.    Return in about 1 month (around 02/29/2020), or if symptoms worsen or fail to improve, for at any time for any worsening symptoms, Go to Emergency room/ urgent care if worse.        Jairo Ben, FNP  [facility] [phone] (phone) [phone] (fax)  [facility]

## 2020-01-31 ENCOUNTER — Other Ambulatory Visit: Payer: Self-pay | Admitting: Adult Health

## 2020-01-31 DIAGNOSIS — E559 Vitamin D deficiency, unspecified: Secondary | ICD-10-CM

## 2020-01-31 LAB — COMPREHENSIVE METABOLIC PANEL
ALT: 21 IU/L (ref 0–32)
AST: 21 IU/L (ref 0–40)
Albumin/Globulin Ratio: 1.7 (ref 1.2–2.2)
Albumin: 4.4 g/dL (ref 3.8–4.8)
Alkaline Phosphatase: 71 IU/L (ref 44–121)
BUN/Creatinine Ratio: 10 (ref 9–23)
BUN: 10 mg/dL (ref 6–20)
Bilirubin Total: 0.4 mg/dL (ref 0.0–1.2)
CO2: 22 mmol/L (ref 20–29)
Calcium: 9.3 mg/dL (ref 8.7–10.2)
Chloride: 103 mmol/L (ref 96–106)
Creatinine, Ser: 0.99 mg/dL (ref 0.57–1.00)
GFR calc Af Amer: 84 mL/min/{1.73_m2} (ref >59)
GFR calc non Af Amer: 73 mL/min/{1.73_m2} (ref >59)
Globulin, Total: 2.6 g/dL (ref 1.5–4.5)
Glucose: 98 mg/dL (ref 65–99)
Potassium: 4.3 mmol/L (ref 3.5–5.2)
Sodium: 137 mmol/L (ref 134–144)
Total Protein: 7 g/dL (ref 6.0–8.5)

## 2020-01-31 LAB — CBC WITH DIFFERENTIAL/PLATELET
Basophils Absolute: 0 10*3/uL (ref 0.0–0.2)
Basos: 1 %
EOS (ABSOLUTE): 0.1 10*3/uL (ref 0.0–0.4)
Eos: 2 %
Hematocrit: 40.5 % (ref 34.0–46.6)
Hemoglobin: 13.3 g/dL (ref 11.1–15.9)
Immature Grans (Abs): 0 10*3/uL (ref 0.0–0.1)
Immature Granulocytes: 1 %
Lymphocytes Absolute: 1 10*3/uL (ref 0.7–3.1)
Lymphs: 19 %
MCH: 29 pg (ref 26.6–33.0)
MCHC: 32.8 g/dL (ref 31.5–35.7)
MCV: 88 fL (ref 79–97)
Monocytes Absolute: 0.5 10*3/uL (ref 0.1–0.9)
Monocytes: 9 %
Neutrophils Absolute: 3.8 10*3/uL (ref 1.4–7.0)
Neutrophils: 68 %
Platelets: 272 10*3/uL (ref 150–450)
RBC: 4.59 x10E6/uL (ref 3.77–5.28)
RDW: 14.2 % (ref 11.7–15.4)
WBC: 5.5 10*3/uL (ref 3.4–10.8)

## 2020-01-31 LAB — LIPID PANEL
Chol/HDL Ratio: 3.1 ratio (ref 0.0–4.4)
Cholesterol, Total: 194 mg/dL (ref 100–199)
HDL: 63 mg/dL (ref >39)
LDL Chol Calc (NIH): 112 mg/dL — ABNORMAL HIGH (ref 0–99)
Triglycerides: 107 mg/dL (ref 0–149)
VLDL Cholesterol Cal: 19 mg/dL (ref 5–40)

## 2020-01-31 LAB — TSH: TSH: 1.14 u[IU]/mL (ref 0.450–4.500)

## 2020-01-31 LAB — VITAMIN D 25 HYDROXY (VIT D DEFICIENCY, FRACTURES): Vit D, 25-Hydroxy: 20.3 ng/mL — ABNORMAL LOW (ref 30.0–100.0)

## 2020-01-31 MED ORDER — VITAMIN D (ERGOCALCIFEROL) 1.25 MG (50000 UNIT) PO CAPS
50000.0000 [IU] | ORAL_CAPSULE | ORAL | 0 refills | Status: DC
Start: 2020-01-31 — End: 2020-03-06

## 2020-01-31 NOTE — Progress Notes (Unsigned)
Meds ordered this encounter  Medications  . Vitamin D, Ergocalciferol, (DRISDOL) 1.25 MG (50000 UNIT) CAPS capsule    Sig: Take 1 capsule (50,000 Units total) by mouth every 7 (seven) days. (taking one tablet per week) walk in lab in office 1-2 weeks after completing prescription.    Dispense:  12 capsule    Refill:  0    I have already ordered this as a future order in the computer.  Orders Placed This Encounter  Procedures  . VITAMIN D 25 Hydroxy (Vit-D Deficiency, Fractures)   

## 2020-01-31 NOTE — Progress Notes (Signed)
CBC without signs of infection or anemia. TSH for thyroid within normal limits.  CMP within normal electrolytes, glucose and kidney/ liver function.  LDL ( bad cholesterol) elevated.  Discuss lifestyle modification with patient e.g. increase exercise, fiber, fruits, vegetables, lean meat, and omega 3/fish intake and decrease saturated fat.  If patient following strict diet and exercise program already please schedule follow up appointment with primary care physician  Vitamin D is low, recommend prescription vitamin D at 50,000 units by mouth once every 7 days ( once weekly) for 12 weeks, repeat lab in office 1- 2 weeks after completing prescription for further management. This may help her sleep and energy level as well.

## 2020-02-08 ENCOUNTER — Telehealth: Payer: Self-pay | Admitting: Adult Health

## 2020-02-08 NOTE — Telephone Encounter (Signed)
Home sleep study in the comments  ?

## 2020-02-08 NOTE — Telephone Encounter (Signed)
Pt refused appointment for her split night sleep study stating that she wanted to do home sleep study

## 2020-02-09 NOTE — Telephone Encounter (Signed)
Hey Jo Soto can you see why this was a split study instead of home sleep study? A split screen was not requested. KW

## 2020-02-09 NOTE — Telephone Encounter (Signed)
Ordered  for sleep study at home

## 2020-02-09 NOTE — Addendum Note (Signed)
Addended by: Berniece Pap on: 02/09/2020 04:55 PM   Modules accepted: Orders

## 2020-02-13 ENCOUNTER — Telehealth: Payer: Self-pay | Admitting: Adult Health

## 2020-02-13 NOTE — Telephone Encounter (Addendum)
[facility] faxed refill request for the following medications:  traZODone (DESYREL) 100 MG tablet - 90 day supply omeprazole (PRILOSEC) 40 MG capsule - 90 day supply  Please advise. Thanks, [employer]

## 2020-02-14 ENCOUNTER — Other Ambulatory Visit: Payer: Self-pay | Admitting: Adult Health

## 2020-02-14 DIAGNOSIS — K219 Gastro-esophageal reflux disease without esophagitis: Secondary | ICD-10-CM

## 2020-02-14 MED ORDER — TRAZODONE HCL 100 MG PO TABS
50.0000 mg | ORAL_TABLET | Freq: Every evening | ORAL | 0 refills | Status: DC | PRN
Start: 2020-02-14 — End: 2020-03-05

## 2020-02-14 MED ORDER — OMEPRAZOLE 40 MG PO CPDR: 40.0000 mg | DELAYED_RELEASE_CAPSULE | Freq: Every day | ORAL | 1 refills | Status: DC

## 2020-02-14 MED ORDER — ESCITALOPRAM OXALATE 10 MG PO TABS: 10.0000 mg | ORAL_TABLET | Freq: Every day | ORAL | 0 refills | Status: DC

## 2020-02-14 NOTE — Telephone Encounter (Signed)
Provider sent to pharmacy. Complete.

## 2020-02-14 NOTE — Telephone Encounter (Signed)
Patient was given a 30 day supply for both of these prescriptions on 01/30/20. Please review request below for 90day. KW

## 2020-02-20 NOTE — Telephone Encounter (Signed)
When this referral was sent to me on 01/30/20 it was for a split night sleep study which is done in lab. It was not until 02/09/20 that I was asked to order a home sleep done

## 2020-02-27 ENCOUNTER — Encounter: Payer: Self-pay | Admitting: Adult Health

## 2020-02-27 LAB — PULMONARY FUNCTION TEST

## 2020-02-28 ENCOUNTER — Encounter: Payer: Self-pay | Admitting: Adult Health

## 2020-03-04 NOTE — Progress Notes (Signed)
Established patient visit   Patient: Jo Soto   DOB: 1981-01-27   39 y.o. Female  MRN: [id number] Visit Date: 03/05/2020  Today's healthcare provider: Jairo Ben, FNP   Chief Complaint  Patient presents with   Insomnia   Subjective    HPI   Follow up for insmonia  The patient was last seen for this 1 months ago. Changes made at last visit include ordering sleep study and increasing Trazodone to 100mg  qhs. HSe does not like the way the medication makes her feel and she has found Calms Forte over the counter works better.    She got a call from [facility] yesterday that she has mild sleep apnea, office has not received those results.   She would like to stop Lexapro does not feel it is helping and wants to try Wellbutrin.   Denies any suicidal or homicidal ideations or intents.   She reports poor compliance with treatment. She feels that condition is Unchanged. She is having side effects. Patient states that medication kept her up throughout the night and states during the day she was falling asleep.  Patient  denies any fever, body aches,chills, rash, chest pain, shortness of breath, nausea, vomiting, or diarrhea.   Denies dizziness, lightheadedness, pre syncopal or syncopal episodes.      -----------------------------------------------------------------------------------------      Medications: Outpatient Medications Prior to Visit  Medication Sig   CHLOROPHYLL PO Take 1 tablet by mouth daily.   levonorgestrel (MIRENA) 20 MCG/24HR IUD 1 each by Intrauterine route once.   Multiple Vitamin (DAILY-VITAMIN PO) Take 1 tablet by mouth daily.   omeprazole (PRILOSEC) 40 MG capsule Take 1 capsule (40 mg total) by mouth daily.   [DISCONTINUED] escitalopram (LEXAPRO) 10 MG tablet Take 1 tablet (10 mg total) by mouth daily.   [DISCONTINUED] traZODone (DESYREL) 100 MG tablet Take 0.5-1 tablets (50-100 mg total) by mouth at bedtime  as needed for sleep (use lowest effective dose.).   [DISCONTINUED] Vitamin D, Ergocalciferol, (DRISDOL) 1.25 MG (50000 UNIT) CAPS capsule Take 1 capsule (50,000 Units total) by mouth every 7 (seven) days. (taking one tablet per week) walk in lab in office 1-2 weeks after completing prescription.   No facility-administered medications prior to visit.   Review of Systems  Constitutional: Positive for fatigue. Negative for activity change, appetite change, chills, diaphoresis, fever and unexpected weight change.  HENT: Negative.   Respiratory: Negative.   Cardiovascular: Negative.   Gastrointestinal: Negative.   Genitourinary: Negative.   Musculoskeletal: Negative.   Skin: Negative.   Neurological: Negative.   Psychiatric/Behavioral: Positive for sleep disturbance. Negative for agitation, behavioral problems, confusion, decreased concentration, dysphoric mood, hallucinations, self-injury and suicidal ideas. The patient is not nervous/anxious and is not hyperactive.     @LASTCBCwHEADER @ @LASTMETABOLICLABSwHEADER @ @LASTLIPID1wHEADER @ @LASTHBA1CwHEADER @ @LASTTHYROIDwHEADER @ @LASTVITD25OHwHEADER @ @LASTB12FOLATEwHEADER @    Objective    BP (!) 154/93    Pulse 70    Temp 98.4 F (36.9 C) (Oral)    Resp 16    Wt 222 lb 9.6 oz (101 kg)    SpO2 100%    BMI 39.43 kg/m     Physical Exam Constitutional:      General: She is not in acute distress.    Appearance: Normal appearance. She is obese. She is not ill-appearing, toxic-appearing or diaphoretic.  HENT:     Head: Normocephalic.     Nose: Nose normal.     Mouth/Throat:     Mouth: Mucous membranes are moist.  Eyes:     Pupils: Pupils are equal, round, and reactive to light.  Neck:     Vascular: No carotid bruit.  Cardiovascular:     Rate and Rhythm: Normal rate and regular rhythm.     Pulses: Normal pulses.     Heart sounds: Normal heart sounds. No murmur heard. No friction rub. No gallop.   Pulmonary:     Effort: Pulmonary  effort is normal. No respiratory distress.     Breath sounds: Normal breath sounds. No stridor. No wheezing, rhonchi or rales.  Chest:     Chest wall: No tenderness.  Abdominal:     General: There is no distension.     Palpations: Abdomen is soft.     Tenderness: There is no abdominal tenderness.  Musculoskeletal:        General: No swelling, tenderness, deformity or signs of injury.     Cervical back: Normal range of motion. No rigidity or tenderness.     Right lower leg: No edema.     Left lower leg: No edema.  Lymphadenopathy:     Cervical: No cervical adenopathy.  Skin:    General: Skin is dry.     Coloration: Skin is not jaundiced.     Findings: No erythema.  Neurological:     General: No focal deficit present.     Mental Status: She is alert and oriented to person, place, and time.  Psychiatric:        Mood and Affect: Mood normal.        Behavior: Behavior normal.        Thought Content: Thought content normal.        Judgment: Judgment normal.      Results for orders placed or performed in visit on 03/05/20  VITAMIN D 25 Hydroxy (Vit-D Deficiency, Fractures)  Result Value Ref Range   Vit D, 25-Hydroxy 33.8 30.0 - 100.0 ng/mL    Assessment & Plan     Anxiety  Gastroesophageal reflux disease without esophagitis - Plan: Ambulatory referral to Gastroenterology  Irritable bowel syndrome without diarrhea - Plan: Ambulatory referral to Gastroenterology  Body mass index (BMI) of 39.0-39.9 in adult   Orders Placed This Encounter  Procedures   Ambulatory referral to Gastroenterology    Referral Priority:   Routine    Referral Type:   Consultation    Referral Reason:   Specialty Services Required    Number of Visits Requested:   1   Meds ordered this encounter  Medications   buPROPion (WELLBUTRIN XL) 300 MG 24 hr tablet    Sig: Take 1 tablet (300 mg total) by mouth daily.    Dispense:  90 tablet    Refill:  0     Return in about 3 months (around  06/03/2020), or if symptoms worsen or fail to improve, for at any time for any worsening symptoms, Go to Emergency room/ urgent care if worse.    The entirety of the information documented in the History of Present Illness, Review of Systems and Physical Exam were personally obtained by me. Portions of this information were initially documented by the CMA and reviewed by me for thoroughness and accuracy.      Jairo Ben, FNP  [facility] [phone] (phone) [phone] (fax)  [facility]

## 2020-03-05 ENCOUNTER — Other Ambulatory Visit: Payer: Self-pay | Admitting: Adult Health

## 2020-03-05 ENCOUNTER — Encounter: Payer: Self-pay | Admitting: Adult Health

## 2020-03-05 ENCOUNTER — Other Ambulatory Visit: Payer: Self-pay

## 2020-03-05 ENCOUNTER — Ambulatory Visit (INDEPENDENT_AMBULATORY_CARE_PROVIDER_SITE_OTHER): Payer: BC Managed Care – PPO | Admitting: Adult Health

## 2020-03-05 VITALS — BP 154/93 | HR 70 | Temp 98.4°F | Resp 16 | Wt 222.6 lb

## 2020-03-05 DIAGNOSIS — E559 Vitamin D deficiency, unspecified: Secondary | ICD-10-CM | POA: Diagnosis not present

## 2020-03-05 DIAGNOSIS — K589 Irritable bowel syndrome without diarrhea: Secondary | ICD-10-CM

## 2020-03-05 DIAGNOSIS — K219 Gastro-esophageal reflux disease without esophagitis: Secondary | ICD-10-CM

## 2020-03-05 DIAGNOSIS — F419 Anxiety disorder, unspecified: Secondary | ICD-10-CM

## 2020-03-05 DIAGNOSIS — Z6839 Body mass index (BMI) 39.0-39.9, adult: Secondary | ICD-10-CM | POA: Diagnosis not present

## 2020-03-05 MED ORDER — BUPROPION HCL ER (XL) 300 MG PO TB24: 300.0000 mg | ORAL_TABLET | Freq: Every day | ORAL | 0 refills | Status: DC

## 2020-03-05 NOTE — Patient Instructions (Addendum)
Sleep Apnea Sleep apnea affects breathing during sleep. It causes breathing to stop for a short time or to become shallow. It can also increase the risk of:  Heart attack.  Stroke.  Being very overweight (obese).  Diabetes.  Heart failure.  Irregular heartbeat. The goal of treatment is to help you breathe normally again. What are the causes? There are three kinds of sleep apnea:  Obstructive sleep apnea. This is caused by a blocked or collapsed airway.  Central sleep apnea. This happens when the brain does not send the right signals to the muscles that control breathing.  Mixed sleep apnea. This is a combination of obstructive and central sleep apnea. The most common cause of this condition is a collapsed or blocked airway. This can happen if:  Your throat muscles are too relaxed.  Your tongue and tonsils are too large.  You are overweight.  Your airway is too small. What increases the risk?  Being overweight.  Smoking.  Having a small airway.  Being older.  Being female.  Drinking alcohol.  Taking medicines to calm yourself (sedatives or tranquilizers).  Having family members with the condition. What are the signs or symptoms?  Trouble staying asleep.  Being sleepy or tired during the day.  Getting angry a lot.  Loud snoring.  Headaches in the morning.  Not being able to focus your mind (concentrate).  Forgetting things.  Less interest in sex.  Mood swings.  Personality changes.  Feelings of sadness (depression).  Waking up a lot during the night to pee (urinate).  Dry mouth.  Sore throat. How is this diagnosed?  Your medical history.  A physical exam.  A test that is done when you are sleeping (sleep study). The test is most often done in a sleep lab but may also be done at home. How is this treated?   Sleeping on your side.  Using a medicine to get rid of mucus in your nose (decongestant).  Avoiding the use of alcohol,  medicines to help you relax, or certain pain medicines (narcotics).  Losing weight, if needed.  Changing your diet.  Not smoking.  Using a machine to open your airway while you sleep, such as: ? An oral appliance. This is a mouthpiece that shifts your lower jaw forward. ? A CPAP device. This device blows air through a mask when you breathe out (exhale). ? An EPAP device. This has valves that you put in each nostril. ? A BPAP device. This device blows air through a mask when you breathe in (inhale) and breathe out.  Having surgery if other treatments do not work. It is important to get treatment for sleep apnea. Without treatment, it can lead to:  High blood pressure.  Coronary artery disease.  In men, not being able to have an erection (impotence).  Reduced thinking ability. Follow these instructions at home: Lifestyle  Make changes that your doctor recommends.  Eat a healthy diet.  Lose weight if needed.  Avoid alcohol, medicines to help you relax, and some pain medicines.  Do not use any products that contain nicotine or tobacco, such as cigarettes, e-cigarettes, and chewing tobacco. If you need help quitting, ask your doctor. General instructions  Take over-the-counter and prescription medicines only as told by your doctor.  If you were given a machine to use while you sleep, use it only as told by your doctor.  If you are having surgery, make sure to tell your doctor you have sleep apnea. You   may need to bring your device with you.  Keep all follow-up visits as told by your doctor. This is important. Contact a doctor if:  The machine that you were given to use during sleep bothers you or does not seem to be working.  You do not get better.  You get worse. Get help right away if:  Your chest hurts.  You have trouble breathing in enough air.  You have an uncomfortable feeling in your back, arms, or stomach.  You have trouble talking.  One side of your  body feels weak.  A part of your face is hanging down. These symptoms may be an emergency. Do not wait to see if the symptoms will go away. Get medical help right away. Call your local emergency services (911 in the [place] Do not drive yourself to the hospital. Summary  This condition affects breathing during sleep.  The most common cause is a collapsed or blocked airway.  The goal of treatment is to help you breathe normally while you sleep. This information is not intended to replace advice given to you by your health care provider. Make sure you discuss any questions you have with your health care provider. Document Revised: 12/24/2017 Document Reviewed: 11/02/2017 Elsevier Patient Education  2020 [employer] Bupropion extended-release tablets (Depression/Mood Disorders) What is this medicine? BUPROPION (byoo PROE pee on) is used to treat depression. This medicine may be used for other purposes; ask your health care provider or pharmacist if you have questions. COMMON BRAND NAME(S): Aplenzin, Budeprion XL, Forfivo XL, Wellbutrin XL What should I tell my health care provider before I take this medicine? They need to know if you have any of these conditions:  an eating disorder, such as anorexia or bulimia  bipolar disorder or psychosis  diabetes or high blood sugar, treated with medication  glaucoma  head injury or brain tumor  heart disease, previous heart attack, or irregular heart beat  high blood pressure  kidney or liver disease  seizures (convulsions)  suicidal thoughts or a previous suicide attempt  Tourette's syndrome  weight loss  an unusual or allergic reaction to bupropion, other medicines, foods, dyes, or preservatives  breast-feeding  pregnant or trying to become pregnant How should I use this medicine? Take this medicine by mouth with a glass of water. Follow the directions on the prescription label. You can take it with or without food. If it  upsets your stomach, take it with food. Do not crush, chew, or cut these tablets. This medicine is taken once daily at the same time each day. Do not take your medicine more often than directed. Do not stop taking this medicine suddenly except upon the advice of your doctor. Stopping this medicine too quickly may cause serious side effects or your condition may worsen. A special MedGuide will be given to you by the pharmacist with each prescription and refill. Be sure to read this information carefully each time. Talk to your pediatrician regarding the use of this medicine in children. Special care may be needed. Overdosage: If you think you have taken too much of this medicine contact a poison control center or emergency room at once. NOTE: This medicine is only for you. Do not share this medicine with others. What if I miss a dose? If you miss a dose, skip the missed dose and take your next tablet at the regular time. Do not take double or extra doses. What may interact with this medicine? Do not take this  medicine with any of the following medications:  linezolid  MAOIs like Azilect, Carbex, Eldepryl, Marplan, Nardil, and Parnate  methylene blue (injected into a vein)  other medicines that contain bupropion like Zyban This medicine may also interact with the following medications:  alcohol  certain medicines for anxiety or sleep  certain medicines for blood pressure like metoprolol, propranolol  certain medicines for depression or psychotic disturbances  certain medicines for HIV or AIDS like efavirenz, lopinavir, nelfinavir, ritonavir  certain medicines for irregular heart beat like propafenone, flecainide  certain medicines for Parkinson's disease like amantadine, levodopa  certain medicines for seizures like carbamazepine, phenytoin,  phenobarbital  cimetidine  clopidogrel  cyclophosphamide  digoxin  furazolidone  isoniazid  nicotine  orphenadrine  procarbazine  steroid medicines like prednisone or cortisone  stimulant medicines for attention disorders, weight loss, or to stay awake  tamoxifen  theophylline  thiotepa  ticlopidine  tramadol  warfarin This list may not describe all possible interactions. Give your health care provider a list of all the medicines, herbs, non-prescription drugs, or dietary supplements you use. Also tell them if you smoke, drink alcohol, or use illegal drugs. Some items may interact with your medicine. What should I watch for while using this medicine? Tell your doctor if your symptoms do not get better or if they get worse. Visit your doctor or healthcare provider for regular checks on your progress. Because it may take several weeks to see the full effects of this medicine, it is important to continue your treatment as prescribed by your doctor. This medicine may cause serious skin reactions. They can happen weeks to months after starting the medicine. Contact your healthcare provider right away if you notice fevers or flu-like symptoms with a rash. The rash may be red or purple and then turn into blisters or peeling of the skin. Or, you might notice a red rash with swelling of the face, lips or lymph nodes in your neck or under your arms. Patients and their families should watch out for new or worsening thoughts of suicide or depression. Also watch out for sudden changes in feelings such as feeling anxious, agitated, panicky, irritable, hostile, aggressive, impulsive, severely restless, overly excited and hyperactive, or not being able to sleep. If this happens, especially at the beginning of treatment or after a change in dose, call your healthcare provider. Avoid alcoholic drinks while taking this medicine. Drinking large amounts of alcoholic beverages, using sleeping or  anxiety medicines, or quickly stopping the use of these agents while taking this medicine may increase your risk for a seizure. Do not drive or use heavy machinery until you know how this medicine affects you. This medicine can impair your ability to perform these tasks. Do not take this medicine close to bedtime. It may prevent you from sleeping. Your mouth may get dry. Chewing sugarless gum or sucking hard candy, and drinking plenty of water may help. Contact your doctor if the problem does not go away or is severe. The tablet shell for some brands of this medicine does not dissolve. This is normal. The tablet shell may appear whole in the stool. This is not a cause for concern. What side effects may I notice from receiving this medicine? Side effects that you should report to your doctor or health care professional as soon as possible:  allergic reactions like skin rash, itching or hives, swelling of the face, lips, or tongue  breathing problems  changes in vision  confusion  elevated mood,  decreased need for sleep, racing thoughts, impulsive behavior  fast or irregular heartbeat  hallucinations, loss of contact with reality  increased blood pressure  rash, fever, and swollen lymph nodes  redness, blistering, peeling or loosening of the skin, including inside the mouth  seizures  suicidal thoughts or other mood changes  unusually weak or tired  vomiting Side effects that usually do not require medical attention (report to your doctor or health care professional if they continue or are bothersome):  constipation  headache  loss of appetite  nausea  tremors  weight loss This list may not describe all possible side effects. Call your doctor for medical advice about side effects. You may report side effects to FDA at 1-800-FDA-1088. Where should I keep my medicine? Keep out of the reach of children. Store at room temperature between 15 and 30 degrees C (59 and 86  degrees F). Throw away any unused medicine after the expiration date. NOTE: This sheet is a summary. It may not cover all possible information. If you have questions about this medicine, talk to your doctor, pharmacist, or health care provider.  2020 Elsevier/Gold Standard (2018-06-02 13:45:31) Psyllium granules or powder for solution What is this medicine? PSYLLIUM (SIL i yum) is a bulk-forming fiber laxative. This medicine is used to treat constipation. Increasing fiber in the diet may also help lower cholesterol and promote heart health for some people. This medicine may be used for other purposes; ask your health care provider or pharmacist if you have questions. COMMON BRAND NAME(S): Fiber Therapy, GenFiber, Geri-Mucil, Hydrocil, Konsyl, Metamucil, Metamucil MultiHealth, Mucilin, Natural Fiber Therapy, Reguloid What should I tell my health care provider before I take this medicine? They need to know if you have any of these conditions:  blockage in your bowel  difficulty swallowing  inflammatory bowel disease  phenylketonuria  stomach or intestine problems  sudden change in bowel habits lasting more than 2 weeks  an unusual or allergic reaction to psyllium, other medicines, dyes, or preservatives  pregnant or trying or get pregnant  breast-feeding How should I use this medicine? Mix this medicine into a full glass (240 mL) of water or other cool drink. Take this medicine by mouth. Follow the directions on the package labeling, or take as directed by your health care professional. Take your medicine at regular intervals. Do not take your medicine more often than directed. Talk to your pediatrician regarding the use of this medicine in children. While this drug may be prescribed for children as young as 1 years old for selected conditions, precautions do apply. Overdosage: If you think you have taken too much of this medicine contact a poison control center or emergency room at  once. NOTE: This medicine is only for you. Do not share this medicine with others. What if I miss a dose? If you miss a dose, take it as soon as you can. If it is almost time for your next dose, take only that dose. Do not take double or extra doses. What may interact with this medicine? Interactions are not expected. Take this product at least 2 hours before or after other medicines. This list may not describe all possible interactions. Give your health care provider a list of all the medicines, herbs, non-prescription drugs, or dietary supplements you use. Also tell them if you smoke, drink alcohol, or use illegal drugs. Some items may interact with your medicine. What should I watch for while using this medicine? Check with your doctor or health  care professional if your symptoms do not start to get better or if they get worse. Stop using this medicine and contact your doctor or health care professional if you have rectal bleeding or if you have to treat your constipation for more than 1 week. These could be signs of a more serious condition. Drink several glasses of water a day while you are taking this medicine. This will help to relieve constipation and prevent dehydration. What side effects may I notice from receiving this medicine? Side effects that you should report to your doctor or health care professional as soon as possible:  allergic reactions like skin rash, itching or hives, swelling of the face, lips, or tongue  breathing problems  chest pain  nausea, vomiting  rectal bleeding  trouble swallowing Side effects that usually do not require medical attention (report to your doctor or health care professional if they continue or are bothersome):  bloating  gas  stomach cramps This list may not describe all possible side effects. Call your doctor for medical advice about side effects. You may report side effects to FDA at 1-800-FDA-1088. Where should I keep my  medicine? Keep out of the reach of children. Store at room temperature between 15 and 30 degrees C (59 and 86 degrees F). Protect from moisture. Throw away any unused medicine after the expiration date. NOTE: This sheet is a summary. It may not cover all possible information. If you have questions about this medicine, talk to your doctor, pharmacist, or health care provider.  2020 Elsevier/Gold Standard (2017-08-03 15:41:08)   Calorie Counting for Weight Loss Calories are units of energy. Your body needs a certain amount of calories from food to keep you going throughout the day. When you eat more calories than your body needs, your body stores the extra calories as fat. When you eat fewer calories than your body needs, your body burns fat to get the energy it needs. Calorie counting means keeping track of how many calories you eat and drink each day. Calorie counting can be helpful if you need to lose weight. If you make sure to eat fewer calories than your body needs, you should lose weight. Ask your health care provider what a healthy weight is for you. For calorie counting to work, you will need to eat the right number of calories in a day in order to lose a healthy amount of weight per week. A dietitian can help you determine how many calories you need in a day and will give you suggestions on how to reach your calorie goal.  A healthy amount of weight to lose per week is usually 1-2 lb (0.5-0.9 kg). This usually means that your daily calorie intake should be reduced by 500-750 calories.  Eating 1,200 - 1,500 calories per day can help most women lose weight.  Eating 1,500 - 1,800 calories per day can help most men lose weight. What is my plan? My goal is to have __________ calories per day. If I have this many calories per day, I should lose around __________ pounds per week. What do I need to know about calorie counting? In order to meet your daily calorie goal, you will need to:  Find  out how many calories are in each food you would like to eat. Try to do this before you eat.  Decide how much of the food you plan to eat.  Write down what you ate and how many calories it had. Doing this is called  keeping a food log. To successfully lose weight, it is important to balance calorie counting with a healthy lifestyle that includes regular activity. Aim for 150 minutes of moderate exercise (such as walking) or 75 minutes of vigorous exercise (such as running) each week. Where do I find calorie information?  The number of calories in a food can be found on a Nutrition Facts label. If a food does not have a Nutrition Facts label, try to look up the calories online or ask your dietitian for help. Remember that calories are listed per serving. If you choose to have more than one serving of a food, you will have to multiply the calories per serving by the amount of servings you plan to eat. For example, the label on a package of bread might say that a serving size is 1 slice and that there are 90 calories in a serving. If you eat 1 slice, you will have eaten 90 calories. If you eat 2 slices, you will have eaten 180 calories. How do I keep a food log? Immediately after each meal, record the following information in your food log:  What you ate. Don't forget to include toppings, sauces, and other extras on the food.  How much you ate. This can be measured in cups, ounces, or number of items.  How many calories each food and drink had.  The total number of calories in the meal. Keep your food log near you, such as in a small notebook in your pocket, or use a mobile app or website. Some programs will calculate calories for you and show you how many calories you have left for the day to meet your goal. What are some calorie counting tips?   Use your calories on foods and drinks that will fill you up and not leave you hungry: ? Some examples of foods that fill you up are nuts and nut  butters, vegetables, lean proteins, and high-fiber foods like whole grains. High-fiber foods are foods with more than 5 g fiber per serving. ? Drinks such as sodas, specialty coffee drinks, alcohol, and juices have a lot of calories, yet do not fill you up.  Eat nutritious foods and avoid empty calories. Empty calories are calories you get from foods or beverages that do not have many vitamins or protein, such as candy, sweets, and soda. It is better to have a nutritious high-calorie food (such as an avocado) than a food with few nutrients (such as a bag of chips).  Know how many calories are in the foods you eat most often. This will help you calculate calorie counts faster.  Pay attention to calories in drinks. Low-calorie drinks include water and unsweetened drinks.  Pay attention to nutrition labels for "low fat" or "fat free" foods. These foods sometimes have the same amount of calories or more calories than the full fat versions. They also often have added sugar, starch, or salt, to make up for flavor that was removed with the fat.  Find a way of tracking calories that works for you. Get creative. Try different apps or programs if writing down calories does not work for you. What are some portion control tips?  Know how many calories are in a serving. This will help you know how many servings of a certain food you can have.  Use a measuring cup to measure serving sizes. You could also try weighing out portions on a kitchen scale. With time, you will be able to estimate serving  sizes for some foods.  Take some time to put servings of different foods on your favorite plates, bowls, and cups so you know what a serving looks like.  Try not to eat straight from a bag or box. Doing this can lead to overeating. Put the amount you would like to eat in a cup or on a plate to make sure you are eating the right portion.  Use smaller plates, glasses, and bowls to prevent overeating.  Try not to  multitask (for example, watch TV or use your computer) while eating. If it is time to eat, sit down at a table and enjoy your food. This will help you to know when you are full. It will also help you to be aware of what you are eating and how much you are eating. What are tips for following this plan? Reading food labels  Check the calorie count compared to the serving size. The serving size may be smaller than what you are used to eating.  Check the source of the calories. Make sure the food you are eating is high in vitamins and protein and low in saturated and trans fats. Shopping  Read nutrition labels while you shop. This will help you make healthy decisions before you decide to purchase your food.  Make a grocery list and stick to it. Cooking  Try to cook your favorite foods in a healthier way. For example, try baking instead of frying.  Use low-fat dairy products. Meal planning  Use more fruits and vegetables. Half of your plate should be fruits and vegetables.  Include lean proteins like poultry and fish. How do I count calories when eating out?  Ask for smaller portion sizes.  Consider sharing an entree and sides instead of getting your own entree.  If you get your own entree, eat only half. Ask for a box at the beginning of your meal and put the rest of your entree in it so you are not tempted to eat it.  If calories are listed on the menu, choose the lower calorie options.  Choose dishes that include vegetables, fruits, whole grains, low-fat dairy products, and lean protein.  Choose items that are boiled, broiled, grilled, or steamed. Stay away from items that are buttered, battered, fried, or served with cream sauce. Items labeled "crispy" are usually fried, unless stated otherwise.  Choose water, low-fat milk, unsweetened iced tea, or other drinks without added sugar. If you want an alcoholic beverage, choose a lower calorie option such as a glass of wine or light  beer.  Ask for dressings, sauces, and syrups on the side. These are usually high in calories, so you should limit the amount you eat.  If you want a salad, choose a garden salad and ask for grilled meats. Avoid extra toppings like bacon, cheese, or fried items. Ask for the dressing on the side, or ask for olive oil and vinegar or lemon to use as dressing.  Estimate how many servings of a food you are given. For example, a serving of cooked rice is  cup or about the size of half a baseball. Knowing serving sizes will help you be aware of how much food you are eating at restaurants. The list below tells you how big or small some common portion sizes are based on everyday objects: ? 1 oz--4 stacked dice. ? 3 oz--1 deck of cards. ? 1 tsp--1 die. ? 1 Tbsp-- a ping-pong ball. ? 2 Tbsp--1 ping-pong ball. ?  cup-- baseball. ? 1 cup--1 baseball. Summary  Calorie counting means keeping track of how many calories you eat and drink each day. If you eat fewer calories than your body needs, you should lose weight.  A healthy amount of weight to lose per week is usually 1-2 lb (0.5-0.9 kg). This usually means reducing your daily calorie intake by 500-750 calories.  The number of calories in a food can be found on a Nutrition Facts label. If a food does not have a Nutrition Facts label, try to look up the calories online or ask your dietitian for help.  Use your calories on foods and drinks that will fill you up, and not on foods and drinks that will leave you hungry.  Use smaller plates, glasses, and bowls to prevent overeating. This information is not intended to replace advice given to you by your health care provider. Make sure you discuss any questions you have with your health care provider. Document Revised: 11/26/2017 Document Reviewed: 02/07/2016 Elsevier Patient Education  2020 [employer]   Fat and Cholesterol Restricted Eating Plan Getting too much fat and cholesterol in your diet  may cause health problems. Choosing the right foods helps keep your fat and cholesterol at normal levels. This can keep you from getting certain diseases. Your doctor may recommend an eating plan that includes:  Total fat: ______% or less of total calories a day.  Saturated fat: ______% or less of total calories a day.  Cholesterol: less than _________mg a day.  Fiber: ______g a day. What are tips for following this plan? Meal planning  At meals, divide your plate into four equal parts: ? Fill one-half of your plate with vegetables and green salads. ? Fill one-fourth of your plate with whole grains. ? Fill one-fourth of your plate with low-fat (lean) protein foods.  Eat fish that is high in omega-3 fats at least two times a week. This includes mackerel, tuna, sardines, and salmon.  Eat foods that are high in fiber, such as whole grains, beans, apples, broccoli, carrots, peas, and barley. General tips   Work with your doctor to lose weight if you need to.  Avoid: ? Foods with added sugar. ? Fried foods. ? Foods with partially hydrogenated oils.  Limit alcohol intake to no more than 1 drink a day for nonpregnant women and 2 drinks a day for men. One drink equals 12 oz of beer, 5 oz of wine, or 1 oz of hard liquor. Reading food labels  Check food labels for: ? Trans fats. ? Partially hydrogenated oils. ? Saturated fat (g) in each serving. ? Cholesterol (mg) in each serving. ? Fiber (g) in each serving.  Choose foods with healthy fats, such as: ? Monounsaturated fats. ? Polyunsaturated fats. ? Omega-3 fats.  Choose grain products that have whole grains. Look for the word "whole" as the first word in the ingredient list. Cooking  Cook foods using low-fat methods. These include baking, boiling, grilling, and broiling.  Eat more home-cooked foods. Eat at restaurants and buffets less often.  Avoid cooking using saturated fats, such as butter, cream, palm oil, palm kernel  oil, and coconut oil. Recommended foods  Fruits  All fresh, canned (in natural juice), or frozen fruits. Vegetables  Fresh or frozen vegetables (raw, steamed, roasted, or grilled). Green salads. Grains  Whole grains, such as whole wheat or whole grain breads, crackers, cereals, and pasta. Unsweetened oatmeal, bulgur, barley, quinoa, or brown rice. Corn or whole wheat flour tortillas. Meats  and other protein foods  Ground beef (85% or leaner), grass-fed beef, or beef trimmed of fat. Skinless chicken or [place]. Ground chicken or [place]. Pork trimmed of fat. All fish and seafood. Egg whites. Dried beans, peas, or lentils. Unsalted nuts or seeds. Unsalted canned beans. Nut butters without added sugar or oil. Dairy  Low-fat or nonfat dairy products, such as skim or 1% milk, 2% or reduced-fat cheeses, low-fat and fat-free ricotta or cottage cheese, or plain low-fat and nonfat yogurt. Fats and oils  Tub margarine without trans fats. Light or reduced-fat mayonnaise and salad dressings. Avocado. Olive, canola, sesame, or safflower oils. The items listed above may not be a complete list of foods and beverages you can eat. Contact a dietitian for more information. Foods to avoid Fruits  Canned fruit in heavy syrup. Fruit in cream or butter sauce. Fried fruit. Vegetables  Vegetables cooked in cheese, cream, or butter sauce. Fried vegetables. Grains  White bread. White pasta. White rice. Cornbread. Bagels, pastries, and croissants. Crackers and snack foods that contain trans fat and hydrogenated oils. Meats and other protein foods  Fatty cuts of meat. Ribs, chicken wings, bacon, sausage, bologna, salami, chitterlings, fatback, hot dogs, bratwurst, and packaged lunch meats. Liver and organ meats. Whole eggs and egg yolks. Chicken and [place] with skin. Fried meat. Dairy  Whole or 2% milk, cream, half-and-half, and cream cheese. Whole milk cheeses. Whole-fat or sweetened yogurt. Full-fat  cheeses. Nondairy creamers and whipped toppings. Processed cheese, cheese spreads, and cheese curds. Beverages  Alcohol. Sugar-sweetened drinks such as sodas, lemonade, and fruit drinks. Fats and oils  Butter, stick margarine, lard, shortening, ghee, or bacon fat. Coconut, palm kernel, and palm oils. Sweets and desserts  Corn syrup, sugars, honey, and molasses. Candy. Jam and jelly. Syrup. Sweetened cereals. Cookies, pies, cakes, donuts, muffins, and ice cream. The items listed above may not be a complete list of foods and beverages you should avoid. Contact a dietitian for more information. Summary  Choosing the right foods helps keep your fat and cholesterol at normal levels. This can keep you from getting certain diseases.  At meals, fill one-half of your plate with vegetables and green salads.  Eat high-fiber foods, like whole grains, beans, apples, carrots, peas, and barley.  Limit added sugar, saturated fats, alcohol, and fried foods. This information is not intended to replace advice given to you by your health care provider. Make sure you discuss any questions you have with your health care provider. Document Revised: 11/10/2017 Document Reviewed: 11/24/2016 Elsevier Patient Education  2020 [employer].

## 2020-03-06 ENCOUNTER — Other Ambulatory Visit: Payer: Self-pay | Admitting: Adult Health

## 2020-03-06 LAB — VITAMIN D 25 HYDROXY (VIT D DEFICIENCY, FRACTURES): Vit D, 25-Hydroxy: 33.8 ng/mL (ref 30.0–100.0)

## 2020-03-06 MED ORDER — VITAMIN D (ERGOCALCIFEROL) 1.25 MG (50000 UNIT) PO CAPS: 50000.0000 [IU] | ORAL_CAPSULE | ORAL | 0 refills | Status: DC

## 2020-03-06 NOTE — Progress Notes (Signed)
Vitamin D has inmproved, now in low normal range, she can finish out the 12 week prescription sent. Recheck in 3 months and will then dose accordingly.  Please add vitamin D future lab.

## 2020-03-11 ENCOUNTER — Telehealth: Payer: Self-pay

## 2020-03-11 ENCOUNTER — Encounter: Payer: Self-pay | Admitting: Adult Health

## 2020-03-28 DIAGNOSIS — Z1152 Encounter for screening for COVID-19: Secondary | ICD-10-CM | POA: Diagnosis not present

## 2020-03-28 DIAGNOSIS — Z03818 Encounter for observation for suspected exposure to other biological agents ruled out: Secondary | ICD-10-CM | POA: Diagnosis not present

## 2020-04-01 DIAGNOSIS — Z20822 Contact with and (suspected) exposure to covid-19: Secondary | ICD-10-CM | POA: Diagnosis not present

## 2020-04-01 DIAGNOSIS — Z03818 Encounter for observation for suspected exposure to other biological agents ruled out: Secondary | ICD-10-CM | POA: Diagnosis not present

## 2020-04-03 ENCOUNTER — Ambulatory Visit
Admission: EM | Admit: 2020-04-03 | Discharge: 2020-04-03 | Disposition: A | Discharge status: Home / Self Care | Payer: BC Managed Care – PPO | Attending: Family Medicine | Admitting: Family Medicine

## 2020-04-03 DIAGNOSIS — Z20822 Contact with and (suspected) exposure to covid-19: Secondary | ICD-10-CM | POA: Diagnosis not present

## 2020-04-03 DIAGNOSIS — R059 Cough, unspecified: Secondary | ICD-10-CM

## 2020-04-03 MED ORDER — BENZONATATE 100 MG PO CAPS: 200.0000 mg | ORAL_CAPSULE | Freq: Three times a day (TID) | ORAL | 0 refills | Status: DC

## 2020-04-03 NOTE — Discharge Instructions (Signed)
This is most likely viral Tessalon pearls as needed for cough OTC medicines as needed Follow up as needed for continued or worsening symptoms

## 2020-04-03 NOTE — ED Triage Notes (Signed)
Pt presents with complaints of cough, congestion, body aches, fatigue, and cold chills x 2 days. Daughter was positive for covid last week. Denies any other symptoms.

## 2020-04-03 NOTE — ED Provider Notes (Signed)
Renaldo Fiddler    CSN: [id number] Arrival date & time: 04/03/20  1441      History   Chief Complaint Chief Complaint  Patient presents with  . Cough    HPI Jo Soto is a 40 y.o. female.   Pt is a 40 year old female that presents with cough, congestion, body aches, fatigue and cold chills x2 days.  Symptoms have been constant.  Daughter recently sick with similar symptoms.  Tested negative for COVID 2 days ago.     Past Medical History:  Diagnosis Date  . Allergy   . Anxiety   . Depression   . Heartburn   . Migraines     Patient Active Problem List   Diagnosis Date Noted  . Irritable bowel syndrome without diarrhea 03/05/2020  . Gastroesophageal reflux disease without esophagitis 03/05/2020  . Anxiety 01/30/2020  . Vitamin D insufficiency 01/30/2020  . Screening for blood or protein in urine 01/30/2020  . Snoring 01/30/2020  . Excessive sleepiness 01/30/2020  . Diarrhea 12/08/2019  . Gastritis 12/08/2019  . Mild intermittent asthma without complication 12/08/2019  . Class 3 severe obesity due to excess calories without serious comorbidity with body mass index (BMI) of 40.0 to 44.9 in adult (HCC) 12/08/2019  . Insomnia 12/08/2019    Past Surgical History:  Procedure Laterality Date  . ANTERIOR CRUCIATE LIGAMENT REPAIR  12/2017  . TUMOR REMOVAL  12/2014   Abdominal    OB History   No obstetric history on file.      Home Medications    Prior to Admission medications   Medication Sig Start Date End Date Taking? Authorizing Provider  benzonatate (TESSALON) 100 MG capsule Take 2 capsules (200 mg total) by mouth every 8 (eight) hours. 04/03/20  Yes Miran Kautzman A, NP  buPROPion (WELLBUTRIN XL) 300 MG 24 hr tablet Take 1 tablet (300 mg total) by mouth daily. 03/05/20   Flinchum, Eula Fried, FNP  CHLOROPHYLL PO Take 1 tablet by mouth daily.    [provider]  levonorgestrel (MIRENA) 20 MCG/24HR IUD 1 each by Intrauterine route once.     [provider]  Multiple Vitamin (DAILY-VITAMIN PO) Take 1 tablet by mouth daily.    [provider]  omeprazole (PRILOSEC) 40 MG capsule Take 1 capsule (40 mg total) by mouth daily. 02/14/20   Flinchum, Eula Fried, FNP  Vitamin D, Ergocalciferol, (DRISDOL) 1.25 MG (50000 UNIT) CAPS capsule Take 1 capsule (50,000 Units total) by mouth every 7 (seven) days. (taking one tablet per week) walk in lab in office 1-2 weeks after completing prescription. 03/06/20   Flinchum, Eula Fried, FNP    Family History Family History  Problem Relation Age of Onset  . Anemia Mother   . Cancer Mother   . Diabetes Mother   . Heart disease Sister   . Diabetes Maternal Aunt   . Diabetes Maternal Uncle     Social History Social History   Tobacco Use  . Smoking status: Former Smoker    Years: 8.00  . Smokeless tobacco: Never Used  Vaping Use  . Vaping Use: Never used  Substance Use Topics  . Alcohol use: Yes    Alcohol/week: 9.0 standard drinks    Types: 2 Glasses of wine, 6 Cans of beer, 1 Shots of liquor per week    Comment: 2-3 weekly  . Drug use: Never     Allergies   Patient has no known allergies.   Review of Systems Review of Systems  Physical Exam Triage Vital Signs ED Triage Vitals  Enc Vitals Group     BP 04/03/20 1452 (!) 145/87     Pulse Rate 04/03/20 1452 93     Resp 04/03/20 1452 19     Temp 04/03/20 1452 98.8 F (37.1 C)     Temp Source 04/03/20 1452 Oral     SpO2 04/03/20 1452 96 %     Weight --      Height --      Head Circumference --      Peak Flow --      Pain Score 04/03/20 1454 0     Pain Loc --      Pain Edu? --      Excl. in GC? --    No data found.  Updated Vital Signs BP (!) 145/87 (BP Location: Left Arm)   Pulse 93   Temp 98.8 F (37.1 C) (Oral)   Resp 19   SpO2 96%   Visual Acuity Right Eye Distance:   Left Eye Distance:   Bilateral Distance:    Right Eye Near:   Left Eye Near:    Bilateral Near:     Physical  Exam Vitals and nursing note reviewed.  Constitutional:      General: She is not in acute distress.    Appearance: Normal appearance. She is not ill-appearing, toxic-appearing or diaphoretic.  HENT:     Head: Normocephalic.     Right Ear: Tympanic membrane and ear canal normal.     Left Ear: Tympanic membrane and ear canal normal.     Nose: Nose normal.     Mouth/Throat:     Pharynx: Oropharynx is clear.  Eyes:     Conjunctiva/sclera: Conjunctivae normal.  Cardiovascular:     Rate and Rhythm: Normal rate and regular rhythm.  Pulmonary:     Effort: Pulmonary effort is normal.     Breath sounds: Normal breath sounds.  Musculoskeletal:        General: Normal range of motion.     Cervical back: Normal range of motion.  Skin:    General: Skin is warm and dry.     Findings: No rash.  Neurological:     Mental Status: She is alert.  Psychiatric:        Mood and Affect: Mood normal.      UC Treatments / Results  Labs (all labs ordered are listed, but only abnormal results are displayed) Labs Reviewed  NOVEL CORONAVIRUS, NAA    EKG   Radiology No results found.  Procedures Procedures (including critical care time)  Medications Ordered in UC Medications - No data to display  Initial Impression / Assessment and Plan / UC Course  I have reviewed the triage vital signs and the nursing notes.  Pertinent labs & imaging results that were available during my care of the patient were reviewed by me and considered in my medical decision making (see chart for details).     Cough Most likely viral. Tessalon Perles for cough as needed.  Over-the-counter medicines as needed. Final Clinical Impressions(s) / UC Diagnoses   Final diagnoses:  Exposure to COVID-19 virus  Cough     Discharge Instructions     This is most likely viral Tessalon pearls as needed for cough OTC medicines as needed Follow up as needed for continued or worsening symptoms      ED  Prescriptions    Medication Sig Dispense Auth. Provider   benzonatate (TESSALON) 100 MG  capsule Take 2 capsules (200 mg total) by mouth every 8 (eight) hours. 45 capsule Gibson Telleria A, NP     PDMP not reviewed this encounter.   Janace Aris, NP 04/03/20 1531

## 2020-04-05 LAB — SARS-COV-2, NAA 2 DAY TAT

## 2020-04-05 LAB — NOVEL CORONAVIRUS, NAA: SARS-CoV-2, NAA: DETECTED — AB

## 2020-04-09 DIAGNOSIS — Z20822 Contact with and (suspected) exposure to covid-19: Secondary | ICD-10-CM | POA: Diagnosis not present

## 2020-04-09 DIAGNOSIS — Z03818 Encounter for observation for suspected exposure to other biological agents ruled out: Secondary | ICD-10-CM | POA: Diagnosis not present

## 2020-04-23 ENCOUNTER — Other Ambulatory Visit: Payer: Self-pay

## 2020-04-23 ENCOUNTER — Encounter: Payer: Self-pay | Admitting: Gastroenterology

## 2020-04-23 ENCOUNTER — Ambulatory Visit (INDEPENDENT_AMBULATORY_CARE_PROVIDER_SITE_OTHER): Payer: BC Managed Care – PPO | Admitting: Gastroenterology

## 2020-04-23 VITALS — BP 145/95 | HR 82 | Ht 63.0 in | Wt 221.8 lb

## 2020-04-23 DIAGNOSIS — K311 Adult hypertrophic pyloric stenosis: Secondary | ICD-10-CM | POA: Insufficient documentation

## 2020-04-23 DIAGNOSIS — R1013 Epigastric pain: Secondary | ICD-10-CM

## 2020-04-23 DIAGNOSIS — K828 Other specified diseases of gallbladder: Secondary | ICD-10-CM

## 2020-04-23 MED ORDER — OMEPRAZOLE 40 MG PO CPDR: 40.0000 mg | DELAYED_RELEASE_CAPSULE | Freq: Two times a day (BID) | ORAL | 0 refills | Status: DC

## 2020-04-23 MED ORDER — DICYCLOMINE HCL 10 MG PO CAPS: 10.0000 mg | ORAL_CAPSULE | Freq: Three times a day (TID) | ORAL | 2 refills | Status: DC

## 2020-04-23 NOTE — Progress Notes (Signed)
Wyline Mood MD, MRCP(U.K) [address]  [address]  [place], [place] [address]  Main: [phone]  Fax: [phone]   Gastroenterology Consultation  Referring Provider:     Stephanie Acre* Primary Care Physician:  Berniece Pap, FNP Primary Gastroenterologist:  Dr. Wyline Mood  Reason for Consultation:   IBS        HPI:   Jo Soto is a 40 y.o. y/o female referred for consultation & management  by  Berniece Pap, FNP.     She was referred to [place] in December 2021 for GERD and IBS without diarrhea.  In November 2021 hemoglobin of 13.3 g, CMP normal, TSH normal, vitamin D low.  She states that she has been having issues with regurgitation, abdominal cramping right after she eats in the central part of her abdomen associated with a bowel movement.  Denies any diarrhea.  Denies any constipation.  She does have issues with heartburn.  Recently lost some weight.  As she is unable to keep much food down.  Denies any exposure to marijuana.  She is on Prilosec 40 mg once a day which has not helped at all.  Denies any prior GI evaluation.  Not on any NSAIDs.  Denies any use of any narcotics.  Past Medical History:  Diagnosis Date  . Allergy   . Anxiety   . Depression   . Heartburn   . Migraines     Past Surgical History:  Procedure Laterality Date  . ANTERIOR CRUCIATE LIGAMENT REPAIR  12/2017  . TUMOR REMOVAL  12/2014   Abdominal    Prior to Admission medications   Medication Sig Start Date End Date Taking? Authorizing Provider  benzonatate (TESSALON) 100 MG capsule Take 2 capsules (200 mg total) by mouth every 8 (eight) hours. 04/03/20   Dahlia Byes A, NP  buPROPion (WELLBUTRIN XL) 300 MG 24 hr tablet Take 1 tablet (300 mg total) by mouth daily. 03/05/20   Flinchum, Eula Fried, FNP  CHLOROPHYLL PO Take 1 tablet by mouth daily.    [provider]  levonorgestrel (MIRENA) 20 MCG/24HR IUD 1 each by Intrauterine route once.    [provider]  Multiple Vitamin (DAILY-VITAMIN PO) Take 1 tablet by mouth daily.    [provider]  omeprazole (PRILOSEC) 40 MG capsule Take 1 capsule (40 mg total) by mouth daily. 02/14/20   Flinchum, Eula Fried, FNP  Vitamin D, Ergocalciferol, (DRISDOL) 1.25 MG (50000 UNIT) CAPS capsule Take 1 capsule (50,000 Units total) by mouth every 7 (seven) days. (taking one tablet per week) walk in lab in office 1-2 weeks after completing prescription. 03/06/20   Flinchum, Eula Fried, FNP    Family History  Problem Relation Age of Onset  . Anemia Mother   . Cancer Mother   . Diabetes Mother   . Heart disease Sister   . Diabetes Maternal Aunt   . Diabetes Maternal Uncle      Social History   Tobacco Use  . Smoking status: Former Smoker    Years: 8.00  . Smokeless tobacco: Never Used  Vaping Use  . Vaping Use: Never used  Substance Use Topics  . Alcohol use: Yes    Alcohol/week: 9.0 standard drinks    Types: 2 Glasses of wine, 6 Cans of beer, 1 Shots of liquor per week    Comment: 2-3 weekly  . Drug use: Never    Allergies as of 04/23/2020  . (No Known Allergies)  Review of Systems:    All systems reviewed and negative except where noted in HPI.   Physical Exam:  There were no vitals taken for this visit. No LMP recorded. Psych:  Alert and cooperative. Normal mood and affect. General:   Alert,  Well-developed, well-nourished, pleasant and cooperative in NAD Head:  Normocephalic and atraumatic. Eyes:  Sclera clear, no icterus.   Conjunctiva pink. Ears:  Normal auditory acuity. Lungs:  Respirations even and unlabored.  Clear throughout to auscultation.   No wheezes, crackles, or rhonchi. No acute distress. Heart:  Regular rate and rhythm; no murmurs, clicks, rubs, or gallops. Abdomen:  Normal bowel sounds.  No bruits.  Soft, non-tender and non-distended without masses, hepatosplenomegaly or hernias noted.  No guarding or rebound tenderness.    Neurologic:  Alert and  oriented x3;  grossly normal neurologically. Psych:  Alert and cooperative. Normal mood and affect.  Imaging Studies: No results found.  Assessment and Plan:   Jo Soto is a 40 y.o. y/o female has been referred for GERD and IBS.  Her history is suggestive of possible GERD along with some features of dyspepsia.  Plan 1.  Increase dose of Prilosec to 40 mg twice daily as she has had no benefit at 40 mg once a day 2.  Plan HIDA scan to rule out biliary dyskinesia. 3.  EGD to rule out any gastric outlet obstruction. 4.  Bentyl 10 mg as needed for abdominal cramping to be taken prior to meals. 5.  High-fiber diet.   I have discussed alternative options, risks & benefits,  which include, but are not limited to, bleeding, infection, perforation,respiratory complication & drug reaction.  The patient agrees with this plan & written consent will be obtained.  \     Follow up in 4 to 6 weeks  Dr Wyline Mood MD,MRCP(U.K)

## 2020-04-23 NOTE — Progress Notes (Signed)
HIDA

## 2020-04-23 NOTE — Patient Instructions (Signed)
Gastroesophageal Reflux Disease, Adult  Gastroesophageal reflux (GER) happens when acid from the stomach flows up into the tube that connects the mouth and the stomach (esophagus). Normally, food travels down the esophagus and stays in the stomach to be digested. With GER, food and stomach acid sometimes move back up into the esophagus. You may have a disease called gastroesophageal reflux disease (GERD) if the reflux:  Happens often.  Causes frequent or very bad symptoms.  Causes problems such as damage to the esophagus. When this happens, the esophagus becomes sore and swollen. Over time, GERD can make small holes (ulcers) in the lining of the esophagus. What are the causes? This condition is caused by a problem with the muscle between the esophagus and the stomach. When this muscle is weak or not normal, it does not close properly to keep food and acid from coming back up from the stomach. The muscle can be weak because of:  Tobacco use.  Pregnancy.  Having a certain type of hernia (hiatal hernia).  Alcohol use.  Certain foods and drinks, such as coffee, chocolate, onions, and peppermint. What increases the risk?  Being overweight.  Having a disease that affects your connective tissue.  Taking NSAIDs, such a ibuprofen. What are the signs or symptoms?  Heartburn.  Difficult or painful swallowing.  The feeling of having a lump in the throat.  A bitter taste in the mouth.  Bad breath.  Having a lot of saliva.  Having an upset or bloated stomach.  Burping.  Chest pain. Different conditions can cause chest pain. Make sure you see your doctor if you have chest pain.  Shortness of breath or wheezing.  A long-term cough or a cough at night.  Wearing away of the surface of teeth (tooth enamel).  Weight loss. How is this treated?  Making changes to your diet.  Taking medicine.  Having surgery. Treatment will depend on how bad your symptoms are. Follow these  instructions at home: Eating and drinking  Follow a diet as told by your doctor. You may need to avoid foods and drinks such as: ? Coffee and tea, with or without caffeine. ? Drinks that contain alcohol. ? Energy drinks and sports drinks. ? Bubbly (carbonated) drinks or sodas. ? Chocolate and cocoa. ? Peppermint and mint flavorings. ? Garlic and onions. ? Horseradish. ? Spicy and acidic foods. These include peppers, chili powder, curry powder, vinegar, hot sauces, and BBQ sauce. ? Citrus fruit juices and citrus fruits, such as oranges, lemons, and limes. ? Tomato-based foods. These include red sauce, chili, salsa, and pizza with red sauce. ? Fried and fatty foods. These include donuts, french fries, potato chips, and high-fat dressings. ? High-fat meats. These include hot dogs, rib eye steak, sausage, ham, and bacon. ? High-fat dairy items, such as whole milk, butter, and cream cheese.  Eat small meals often. Avoid eating large meals.  Avoid drinking large amounts of liquid with your meals.  Avoid eating meals during the 2-3 hours before bedtime.  Avoid lying down right after you eat.  Do not exercise right after you eat.   Lifestyle  Do not smoke or use any products that contain nicotine or tobacco. If you need help quitting, ask your doctor.  Try to lower your stress. If you need help doing this, ask your doctor.  If you are overweight, lose an amount of weight that is healthy for you. Ask your doctor about a safe weight loss goal.   General instructions    Pay attention to any changes in your symptoms.  Take over-the-counter and prescription medicines only as told by your doctor.  Do not take aspirin, ibuprofen, or other NSAIDs unless your doctor says it is okay.  Wear loose clothes. Do not wear anything tight around your waist.  Raise (elevate) the head of your bed about 6 inches (15 cm). You may need to use a wedge to do this.  Avoid bending over if this makes your  symptoms worse.  Keep all follow-up visits. Contact a doctor if:  You have new symptoms.  You lose weight and you do not know why.  You have trouble swallowing or it hurts to swallow.  You have wheezing or a cough that keeps happening.  You have a hoarse voice.  Your symptoms do not get better with treatment. Get help right away if:  You have sudden pain in your arms, neck, jaw, teeth, or back.  You suddenly feel sweaty, dizzy, or light-headed.  You have chest pain or shortness of breath.  You vomit and the vomit is green, yellow, or black, or it looks like blood or coffee grounds.  You faint.  Your poop (stool) is red, bloody, or black.  You cannot swallow, drink, or eat. These symptoms may represent a serious problem that is an emergency. Do not wait to see if the symptoms will go away. Get medical help right away. Call your local emergency services (911 in the [place] Do not drive yourself to the hospital. Summary  If a person has gastroesophageal reflux disease (GERD), food and stomach acid move back up into the esophagus and cause symptoms or problems such as damage to the esophagus.  Treatment will depend on how bad your symptoms are.  Follow a diet as told by your doctor.  Take all medicines only as told by your doctor. This information is not intended to replace advice given to you by your health care provider. Make sure you discuss any questions you have with your health care provider. Document Revised: 09/18/2019 Document Reviewed: 09/18/2019 Elsevier Patient Education  2021 Elsevier Inc. High-Fiber Eating Plan Fiber, also called dietary fiber, is a type of carbohydrate. It is found foods such as fruits, vegetables, whole grains, and beans. A high-fiber diet can have many health benefits. Your health care provider may recommend a high-fiber diet to help:  Prevent constipation. Fiber can make your bowel movements more regular.  Lower your  cholesterol.  Relieve the following conditions: ? Inflammation of veins in the anus (hemorrhoids). ? Inflammation of specific areas of the digestive tract (uncomplicated diverticulosis). ? A problem of the large intestine, also called the colon, that sometimes causes pain and diarrhea (irritable bowel syndrome, or IBS).  Prevent overeating as part of a weight-loss plan.  Prevent heart disease, type 2 diabetes, and certain cancers. What are tips for following this plan? Reading food labels  Check the nutrition facts label on food products for the amount of dietary fiber. Choose foods that have 5 grams of fiber or more per serving.  The goals for recommended daily fiber intake include: ? Men (age 50 or younger): 34-38 g. ? Men (over age 50): 28-34 g. ? Women (age 50 or younger): 25-28 g. ? Women (over age 50): 22-25 g. Your daily fiber goal is _____________ g.   Shopping  Choose whole fruits and vegetables instead of processed forms, such as apple juice or applesauce.  Choose a wide variety of high-fiber foods such as avocados, lentils, oats, and   kidney beans.  Read the nutrition facts label of the foods you choose. Be aware of foods with added fiber. These foods often have high sugar and sodium amounts per serving. Cooking  Use whole-grain flour for baking and cooking.  Cook with brown rice instead of white rice. Meal planning  Start the day with a breakfast that is high in fiber, such as a cereal that contains 5 g of fiber or more per serving.  Eat breads and cereals that are made with whole-grain flour instead of refined flour or white flour.  Eat brown rice, bulgur wheat, or millet instead of white rice.  Use beans in place of meat in soups, salads, and pasta dishes.  Be sure that half of the grains you eat each day are whole grains. General information  You can get the recommended daily intake of dietary fiber by: ? Eating a variety of fruits, vegetables, grains,  nuts, and beans. ? Taking a fiber supplement if you are not able to take in enough fiber in your diet. It is better to get fiber through food than from a supplement.  Gradually increase how much fiber you consume. If you increase your intake of dietary fiber too quickly, you may have bloating, cramping, or gas.  Drink plenty of water to help you digest fiber.  Choose high-fiber snacks, such as berries, raw vegetables, nuts, and popcorn. What foods should I eat? Fruits Berries. Pears. Apples. Oranges. Avocado. Prunes and raisins. Dried figs. Vegetables Sweet potatoes. Spinach. Kale. Artichokes. Cabbage. Broccoli. Cauliflower. Green peas. Carrots. Squash. Grains Whole-grain breads. Multigrain cereal. Oats and oatmeal. Brown rice. Barley. Bulgur wheat. Millet. Quinoa. Bran muffins. Popcorn. Rye wafer crackers. Meats and other proteins Navy beans, kidney beans, and pinto beans. Soybeans. Split peas. Lentils. Nuts and seeds. Dairy Fiber-fortified yogurt. Beverages Fiber-fortified soy milk. Fiber-fortified orange juice. Other foods Fiber bars. The items listed above may not be a complete list of recommended foods and beverages. Contact a dietitian for more information. What foods should I avoid? Fruits Fruit juice. Cooked, strained fruit. Vegetables Fried potatoes. Canned vegetables. Well-cooked vegetables. Grains White bread. Pasta made with refined flour. White rice. Meats and other proteins Fatty cuts of meat. Fried chicken or fried fish. Dairy Milk. Yogurt. Cream cheese. Sour cream. Fats and oils Butters. Beverages Soft drinks. Other foods Cakes and pastries. The items listed above may not be a complete list of foods and beverages to avoid. Talk with your dietitian about what choices are best for you. Summary  Fiber is a type of carbohydrate. It is found in foods such as fruits, vegetables, whole grains, and beans.  A high-fiber diet has many benefits. It can help to  prevent constipation, lower blood cholesterol, aid weight loss, and reduce your risk of heart disease, diabetes, and certain cancers.  Increase your intake of fiber gradually. Increasing fiber too quickly may cause cramping, bloating, and gas. Drink plenty of water while you increase the amount of fiber you consume.  The best sources of fiber include whole fruits and vegetables, whole grains, nuts, seeds, and beans. This information is not intended to replace advice given to you by your health care provider. Make sure you discuss any questions you have with your health care provider. Document Revised: 07/13/2019 Document Reviewed: 07/13/2019 Elsevier Patient Education  2021 Elsevier Inc.  

## 2020-04-26 ENCOUNTER — Other Ambulatory Visit: Payer: Self-pay | Admitting: Internal Medicine

## 2020-04-26 DIAGNOSIS — K2950 Unspecified chronic gastritis without bleeding: Secondary | ICD-10-CM

## 2020-05-02 ENCOUNTER — Other Ambulatory Visit: Payer: BC Managed Care – PPO

## 2020-05-03 ENCOUNTER — Ambulatory Visit
Admission: RE | Admit: 2020-05-03 | Discharge: 2020-05-03 | Disposition: A | Discharge status: Home / Self Care | Payer: BC Managed Care – PPO | Source: Ambulatory Visit | Attending: Gastroenterology | Admitting: Gastroenterology

## 2020-05-03 ENCOUNTER — Other Ambulatory Visit: Payer: Self-pay

## 2020-05-03 DIAGNOSIS — Z793 Long term (current) use of hormonal contraceptives: Secondary | ICD-10-CM | POA: Diagnosis not present

## 2020-05-03 DIAGNOSIS — Z79899 Other long term (current) drug therapy: Secondary | ICD-10-CM | POA: Diagnosis not present

## 2020-05-03 DIAGNOSIS — Z87891 Personal history of nicotine dependence: Secondary | ICD-10-CM | POA: Diagnosis not present

## 2020-05-03 DIAGNOSIS — R1013 Epigastric pain: Secondary | ICD-10-CM | POA: Diagnosis not present

## 2020-05-03 DIAGNOSIS — Z20822 Contact with and (suspected) exposure to covid-19: Secondary | ICD-10-CM | POA: Insufficient documentation

## 2020-05-03 DIAGNOSIS — K2950 Unspecified chronic gastritis without bleeding: Secondary | ICD-10-CM | POA: Diagnosis not present

## 2020-05-04 LAB — SARS CORONAVIRUS 2 (TAT 6-24 HRS): SARS Coronavirus 2: NEGATIVE

## 2020-05-06 ENCOUNTER — Encounter
Admission: RE | Disposition: A | Discharge status: Home / Self Care | Payer: Self-pay | Source: Home / Self Care | Attending: Gastroenterology

## 2020-05-06 ENCOUNTER — Ambulatory Visit: Payer: BC Managed Care – PPO | Admitting: Certified Registered Nurse Anesthetist

## 2020-05-06 ENCOUNTER — Ambulatory Visit
Admission: RE | Admit: 2020-05-06 | Discharge: 2020-05-06 | Disposition: A | Discharge status: Home / Self Care | Payer: BC Managed Care – PPO | Attending: Gastroenterology | Admitting: Gastroenterology

## 2020-05-06 ENCOUNTER — Other Ambulatory Visit: Payer: Self-pay

## 2020-05-06 ENCOUNTER — Encounter: Payer: Self-pay | Admitting: Gastroenterology

## 2020-05-06 DIAGNOSIS — K3184 Gastroparesis: Secondary | ICD-10-CM | POA: Diagnosis not present

## 2020-05-06 DIAGNOSIS — K311 Adult hypertrophic pyloric stenosis: Secondary | ICD-10-CM

## 2020-05-06 DIAGNOSIS — Z79899 Other long term (current) drug therapy: Secondary | ICD-10-CM | POA: Diagnosis not present

## 2020-05-06 DIAGNOSIS — K2950 Unspecified chronic gastritis without bleeding: Secondary | ICD-10-CM | POA: Diagnosis not present

## 2020-05-06 DIAGNOSIS — R1013 Epigastric pain: Secondary | ICD-10-CM | POA: Diagnosis not present

## 2020-05-06 DIAGNOSIS — Z87891 Personal history of nicotine dependence: Secondary | ICD-10-CM | POA: Diagnosis not present

## 2020-05-06 DIAGNOSIS — Z793 Long term (current) use of hormonal contraceptives: Secondary | ICD-10-CM | POA: Diagnosis not present

## 2020-05-06 DIAGNOSIS — Z20822 Contact with and (suspected) exposure to covid-19: Secondary | ICD-10-CM | POA: Diagnosis not present

## 2020-05-06 DIAGNOSIS — K2970 Gastritis, unspecified, without bleeding: Secondary | ICD-10-CM | POA: Diagnosis not present

## 2020-05-06 HISTORY — PX: ESOPHAGOGASTRODUODENOSCOPY (EGD) WITH PROPOFOL: SHX5813

## 2020-05-06 LAB — POCT PREGNANCY, URINE: Preg Test, Ur: NEGATIVE

## 2020-05-06 SURGERY — ESOPHAGOGASTRODUODENOSCOPY (EGD) WITH PROPOFOL
Anesthesia: General

## 2020-05-06 MED ORDER — PROPOFOL 10 MG/ML IV BOLUS
INTRAVENOUS | Status: DC | PRN
  Administered 2020-05-06 (×2): 20 mg via INTRAVENOUS
  Administered 2020-05-06: 60 mg via INTRAVENOUS
  Administered 2020-05-06: 20 mg via INTRAVENOUS

## 2020-05-06 MED ORDER — PROPOFOL 500 MG/50ML IV EMUL
INTRAVENOUS | Status: DC | PRN
  Administered 2020-05-06: 150 ug/kg/min via INTRAVENOUS

## 2020-05-06 MED ORDER — LIDOCAINE HCL (CARDIAC) PF 100 MG/5ML IV SOSY
PREFILLED_SYRINGE | INTRAVENOUS | Status: DC | PRN
  Administered 2020-05-06: 50 mg via INTRAVENOUS

## 2020-05-06 MED ORDER — SODIUM CHLORIDE 0.9 % IV SOLN
INTRAVENOUS | Status: DC
  Administered 2020-05-06: 20 mL/h via INTRAVENOUS

## 2020-05-06 NOTE — H&P (Signed)
Wyline Mood, MD [address], [place], [place], [address] [address], [address], [place], [place], [address] Phone: [phone]  Fax: [phone]  Primary Care Physician:  Berniece Pap, FNP   Pre-Procedure History & Physical: HPI:  Jo Soto is a 40 y.o. female is here for an endoscopy    Past Medical History:  Diagnosis Date  . Allergy   . Anxiety   . Depression   . Heartburn   . Migraines     Past Surgical History:  Procedure Laterality Date  . ANTERIOR CRUCIATE LIGAMENT REPAIR  12/2017  . TUMOR REMOVAL  12/2014   Abdominal    Prior to Admission medications   Medication Sig Start Date End Date Taking? Authorizing Provider  buPROPion (WELLBUTRIN XL) 300 MG 24 hr tablet Take 1 tablet (300 mg total) by mouth daily. 03/05/20  Yes Flinchum, Eula Fried, FNP  CHLOROPHYLL PO Take 1 tablet by mouth daily.   Yes [provider]  dicyclomine (BENTYL) 10 MG capsule Take 1 capsule (10 mg total) by mouth 4 (four) times daily -  before meals and at bedtime. 04/23/20  Yes Wyline Mood, MD  levonorgestrel [facility]) 20 MCG/24HR IUD 1 each by Intrauterine route once.   Yes [provider]  Multiple Vitamin (DAILY-VITAMIN PO) Take 1 tablet by mouth daily.   Yes [provider]  omeprazole (PRILOSEC) 20 MG capsule TAKE 1 CAPSULE BY MOUTH EVERY DAY 04/26/20  Yes Irish Lack, FNP  omeprazole (PRILOSEC) 40 MG capsule Take 1 capsule (40 mg total) by mouth in the morning and at bedtime. 04/23/20  Yes Wyline Mood, MD  Vitamin D, Ergocalciferol, (DRISDOL) 1.25 MG (50000 UNIT) CAPS capsule Take 1 capsule (50,000 Units total) by mouth every 7 (seven) days. (taking one tablet per week) walk in lab in office 1-2 weeks after completing prescription. 03/06/20  Yes Flinchum, Eula Fried, FNP  benzonatate (TESSALON) 100 MG capsule Take 2 capsules (200 mg total) by mouth every 8 (eight) hours. 04/03/20   Janace Aris, NP    Allergies as of 04/24/2020  .  (No Known Allergies)    Family History  Problem Relation Age of Onset  . Anemia Mother   . Cancer Mother   . Diabetes Mother   . Heart disease Sister   . Diabetes Maternal Aunt   . Diabetes Maternal Uncle     Social History   Socioeconomic History  . Marital status: Legally Separated    Spouse name: Not on file  . Number of children: Not on file  . Years of education: Not on file  . Highest education level: Not on file  Occupational History    Employer: [employer]  Tobacco Use  . Smoking status: Former Smoker    Years: 8.00  . Smokeless tobacco: Never Used  Vaping Use  . Vaping Use: Never used  Substance and Sexual Activity  . Alcohol use: Yes    Alcohol/week: 9.0 standard drinks    Types: 2 Glasses of wine, 6 Cans of beer, 1 Shots of liquor per week    Comment: 2-3 weekly  . Drug use: Never  . Sexual activity: Yes  Other Topics Concern  . Not on file  Social History Narrative  . Not on file   Social Determinants of Health   Financial Resource Strain: Not on file  Food Insecurity: Not on file  Transportation Needs: Not on file  Physical Activity: Not on file  Stress: Not on file  Social Connections: Not on file  Intimate Partner Violence: Not on file    Review of Systems: See HPI, otherwise negative ROS  Physical Exam: BP (!) 136/107   Pulse 82   Temp (!) 97.2 F (36.2 C) (Temporal)   Resp 20   Ht 5\' 3"  (1.6 m)   Wt 98.9 kg   SpO2 100%   BMI 38.62 kg/m  General:   Alert,  pleasant and cooperative in NAD Head:  Normocephalic and atraumatic. Neck:  Supple; no masses or thyromegaly. Lungs:  Clear throughout to auscultation, normal respiratory effort.    Heart:  +S1, +S2, Regular rate and rhythm, No edema. Abdomen:  Soft, nontender and nondistended. Normal bowel sounds, without guarding, and without rebound.   Neurologic:  Alert and  oriented x4;  grossly normal neurologically.  Impression/Plan: Jo Soto is here for an endoscopy  to be  performed for  evaluation of dyspepsia    Risks, benefits, limitations, and alternatives regarding endoscopy have been reviewed with the patient.  Questions have been answered.  All parties agreeable.   Velta Addison, MD  05/06/2020, 10:16 AM

## 2020-05-06 NOTE — Anesthesia Preprocedure Evaluation (Signed)
Anesthesia Evaluation  Patient identified by MRN, date of birth, ID band Patient awake    Reviewed: Allergy & Precautions, H&P , NPO status , Patient's Chart, lab work & pertinent test results, reviewed documented beta blocker date and time   History of Anesthesia Complications Negative for: history of anesthetic complications  Airway Mallampati: III  TM Distance: >3 FB Neck ROM: full    Dental  (+) Missing, Dental Advidsory Given, Teeth Intact Braces:   Pulmonary neg shortness of breath, asthma , sleep apnea , neg COPD, neg recent URI, former smoker,    Pulmonary exam normal breath sounds clear to auscultation       Cardiovascular Exercise Tolerance: Good negative cardio ROS Normal cardiovascular exam Rhythm:regular Rate:Normal     Neuro/Psych PSYCHIATRIC DISORDERS Anxiety Depression negative neurological ROS     GI/Hepatic Neg liver ROS, GERD  ,  Endo/Other  negative endocrine ROS  Renal/GU negative Renal ROS  negative genitourinary   Musculoskeletal   Abdominal   Peds  Hematology negative hematology ROS (+)   Anesthesia Other Findings Past Medical History: No date: Allergy No date: Anxiety No date: Depression No date: Heartburn No date: Migraines   Reproductive/Obstetrics negative OB ROS                             Anesthesia Physical Anesthesia Plan  ASA: II  Anesthesia Plan: General   Post-op Pain Management:    Induction: Intravenous  PONV Risk Score and Plan: 3 and TIVA and Propofol infusion  Airway Management Planned: Natural Airway and Nasal Cannula  Additional Equipment:   Intra-op Plan:   Post-operative Plan:   Informed Consent: I have reviewed the patients History and Physical, chart, labs and discussed the procedure including the risks, benefits and alternatives for the proposed anesthesia with the patient or authorized representative who has indicated  his/her understanding and acceptance.     Dental Advisory Given  Plan Discussed with: Anesthesiologist, CRNA and Surgeon  Anesthesia Plan Comments:         Anesthesia Quick Evaluation

## 2020-05-06 NOTE — Anesthesia Postprocedure Evaluation (Signed)
Anesthesia Post Note  Patient: Jo Soto  Procedure(s) Performed: ESOPHAGOGASTRODUODENOSCOPY (EGD) WITH PROPOFOL (N/A )  Patient location during evaluation: Endoscopy Anesthesia Type: General Level of consciousness: awake and alert Pain management: pain level controlled Vital Signs Assessment: post-procedure vital signs reviewed and stable Respiratory status: spontaneous breathing, nonlabored ventilation, respiratory function stable and patient connected to nasal cannula oxygen Cardiovascular status: blood pressure returned to baseline and stable Postop Assessment: no apparent nausea or vomiting Anesthetic complications: no   No complications documented.   Last Vitals:  Vitals:   05/06/20 1038 05/06/20 1108  BP: 121/85 (!) 124/96  Pulse:    Resp:    Temp: (!) 36.2 C   SpO2:  100%    Last Pain:  Vitals:   05/06/20 1058  TempSrc:   PainSc: 0-No pain                 [name]

## 2020-05-06 NOTE — Op Note (Signed)
[facility] Gastroenterology Patient Name: Jo Soto Procedure Date: 05/06/2020 10:16 AM MRN: [id number] Account #: [id number] Date of Birth: [birth year] Admit Type: Outpatient Age: 40 Room: [facility] ENDO ROOM 4 Gender: Female Note Status: Finalized Procedure:             Upper GI endoscopy Indications:           Dyspepsia Providers:             Wyline Mood MD, MD Referring MD:          Eula Fried. Flinchum (Referring MD) Medicines:             Monitored Anesthesia Care Complications:         No immediate complications. Procedure:             Pre-Anesthesia Assessment:                        - Prior to the procedure, a History and Physical was                         performed, and patient medications, allergies and                         sensitivities were reviewed. The patient's tolerance                         of previous anesthesia was reviewed.                        - The risks and benefits of the procedure and the                         sedation options and risks were discussed with the                         patient. All questions were answered and informed                         consent was obtained.                        - ASA Grade Assessment: II - A patient with mild                         systemic disease.                        After obtaining informed consent, the endoscope was                         passed under direct vision. Throughout the procedure,                         the patient's blood pressure, pulse, and oxygen                         saturations were monitored continuously. The Endoscope                         was introduced through the mouth, and advanced to  the                         third part of duodenum. The upper GI endoscopy was                         accomplished with ease. The patient tolerated the                         procedure well. Findings:      The esophagus was normal.      The examined duodenum was  normal.      A large amount of food (residue) was found in the gastric body.      Normal mucosa was found in the entire examined stomach. Biopsies were       taken with a cold forceps for histology.      The cardia and gastric fundus were normal on retroflexion. Impression:            - Normal esophagus.                        - Normal examined duodenum.                        - A large amount of food (residue) in the stomach.                        - Normal mucosa was found in the entire stomach.                         Biopsied. Recommendation:        - Discharge patient to home (with escort).                        - Resume previous diet.                        - Continue present medications.                        - Gastroparesis diet                        Underlying delayed gastric emptying could be post                         viral ? post COVID related syndrome - should resolve                         over a few months Procedure Code(s):     --- Professional ---                        [phone], Esophagogastroduodenoscopy, flexible,                         transoral; with biopsy, single or multiple Diagnosis Code(s):     --- Professional ---                        R10.13, Epigastric pain CPT copyright 2019 American Medical Association. All rights reserved. The  codes documented in this report are preliminary and upon coder review may  be revised to meet current compliance requirements. Wyline Mood, MD Wyline Mood MD, MD 05/06/2020 10:36:15 AM This report has been signed electronically. Number of Addenda: 0 Note Initiated On: 05/06/2020 10:16 AM Estimated Blood Loss:  Estimated blood loss: none.      [facility]

## 2020-05-06 NOTE — Transfer of Care (Signed)
Immediate Anesthesia Transfer of Care Note  Patient: Jo Soto  Procedure(s) Performed: ESOPHAGOGASTRODUODENOSCOPY (EGD) WITH PROPOFOL (N/A )  Patient Location: PACU  Anesthesia Type:General  Level of Consciousness: awake, alert  and oriented  Airway & Oxygen Therapy: Patient Spontanous Breathing and Patient connected to nasal cannula oxygen  Post-op Assessment: Report given to RN and Post -op Vital signs reviewed and stable  Post vital signs: Reviewed and stable  Last Vitals:  Vitals Value Taken Time  BP    Temp    Pulse 97 05/06/20 1038  Resp 20 05/06/20 1038  SpO2 96 % 05/06/20 1038  Vitals shown include unvalidated device data.  Last Pain:  Vitals:   05/06/20 0951  TempSrc: Temporal  PainSc: 0-No pain         Complications: No complications documented.

## 2020-05-07 ENCOUNTER — Telehealth: Payer: Self-pay

## 2020-05-07 ENCOUNTER — Other Ambulatory Visit: Payer: Self-pay

## 2020-05-07 ENCOUNTER — Ambulatory Visit
Admission: RE | Admit: 2020-05-07 | Discharge: 2020-05-07 | Disposition: A | Discharge status: Home / Self Care | Payer: BC Managed Care – PPO | Source: Ambulatory Visit | Attending: Gastroenterology | Admitting: Gastroenterology

## 2020-05-07 DIAGNOSIS — R1013 Epigastric pain: Secondary | ICD-10-CM | POA: Diagnosis not present

## 2020-05-07 DIAGNOSIS — K828 Other specified diseases of gallbladder: Secondary | ICD-10-CM | POA: Diagnosis not present

## 2020-05-07 DIAGNOSIS — K311 Adult hypertrophic pyloric stenosis: Secondary | ICD-10-CM

## 2020-05-07 MED ORDER — TECHNETIUM TC 99M MEBROFENIN IV KIT
5.0000 | PACK | Freq: Once | INTRAVENOUS | Status: AC | PRN
  Administered 2020-05-07: 5.18 via INTRAVENOUS

## 2020-05-07 NOTE — Progress Notes (Signed)
Per Dr. Tobi Bastos sent referral to general surgery. Informed patient as well.

## 2020-05-07 NOTE — Telephone Encounter (Signed)
-----   Message from Wyline Mood, MD sent at 05/07/2020  3:15 PM EST ----- Jo Soto   Inform the patient that the gallbladder scan that she had shows that her gallbladder is not squeezing adequately and  this could cause pain right after eating.  I would suggest her to be referred to be seen by Dr. Aleen Campi in surgery to decide if taki ng out her gallbladder would be helpful.  If she wishes to speak to me before seeing surgeons I would be happy to do the same otherwise she could speak with them directly.  C/c Flinchum, Eula Fried, FNP , Dr Aleen Campi   Dr Wyline Mood MD,MRCP [facility]) Gastroenterology/Hepatology Pager: [phone]

## 2020-05-07 NOTE — Progress Notes (Signed)
Noted. Thanks.

## 2020-05-07 NOTE — Progress Notes (Signed)
Jo Soto   Inform the patient that the gallbladder scan that she had shows that her gallbladder is not squeezing adequately and  this could cause pain right after eating.  I would suggest her to be referred to be seen by Dr. Aleen Campi in surgery to decide if taking out her gallbladder would be helpful.  If she wishes to speak to me before seeing surgeons I would be happy to do the same otherwise she could speak with them directly.  C/c Flinchum, Eula Fried, FNP , Dr Aleen Campi   Dr Wyline Mood MD,MRCP [facility]) Gastroenterology/Hepatology Pager: [phone]

## 2020-05-07 NOTE — Telephone Encounter (Signed)
Called patient regarding the gallbladder scan. Informed her of Dr. Johnney Killian comments/recommendations. I have sent a referral over to general surgery. Pt wanted to schedule a video visit with Tobi Bastos before she sees the Careers adviser. Pt verbalized understanding.

## 2020-05-08 LAB — SURGICAL PATHOLOGY

## 2020-05-08 NOTE — Progress Notes (Signed)
Patient should receive call from GI MD. Noted.

## 2020-05-09 ENCOUNTER — Telehealth (INDEPENDENT_AMBULATORY_CARE_PROVIDER_SITE_OTHER): Payer: BC Managed Care – PPO | Admitting: Gastroenterology

## 2020-05-09 DIAGNOSIS — R1013 Epigastric pain: Secondary | ICD-10-CM

## 2020-05-09 DIAGNOSIS — K3184 Gastroparesis: Secondary | ICD-10-CM

## 2020-05-09 DIAGNOSIS — K828 Other specified diseases of gallbladder: Secondary | ICD-10-CM

## 2020-05-09 NOTE — Progress Notes (Signed)
Jo Soto , MD [address]  [address]  [place], [place] [address]  Main: [phone]  Fax: [phone]   Primary Care Physician: Berniece Pap, FNP  Virtual Visit via Video Note  I connected with patient on 05/09/20 at  3:15 PM EST by video and verified that I am speaking with the correct person using two identifiers.   I discussed the limitations, risks, security and privacy concerns of performing an evaluation and management service by video  and the availability of in person appointments. I also discussed with the patient that there may be a patient responsible charge related to this service. The patient expressed understanding and agreed to proceed.  Location of Patient: Home Location of Provider: Home Persons involved: Patient and provider only   History of Present Illness:   Follow-up for dyspepsia   HPI: Jo Soto is a 40 y.o. female    Summary of history :  Initially referred and seen on 04/23/2020 for IBS.  She also has history of GERD.  She has been issues with regurgitation, abdominal cramping right after she eats in the central part of her abdomen associated with a bowel movement.  Issues of heartburn in the past.  Had lost some weight.  On Prilosec 40 mg a day which did not help.  Interval history   04/23/2020-05/09/2020  05/07/2020: HIDA scan: Ejection fraction 19%: Referred to Dr. Aleen Campi for cholecystectomy 05/06/2020: EGD: Food seen in the stomach moderate in quantity.  Denies any use of any opioids.  She does have postprandial discomfort and early satiety.  No family history of gallbladder issues.    Current Outpatient Medications  Medication Sig Dispense Refill  . benzonatate (TESSALON) 100 MG capsule Take 2 capsules (200 mg total) by mouth every 8 (eight) hours. 45 capsule 0  . buPROPion (WELLBUTRIN XL) 300 MG 24 hr tablet Take 1 tablet (300 mg total) by mouth daily. 90 tablet 0  . CHLOROPHYLL PO Take 1 tablet by mouth daily.     Marland Kitchen dicyclomine (BENTYL) 10 MG capsule Take 1 capsule (10 mg total) by mouth 4 (four) times daily -  before meals and at bedtime. 90 capsule 2  . levonorgestrel (MIRENA) 20 MCG/24HR IUD 1 each by Intrauterine route once.    . Multiple Vitamin (DAILY-VITAMIN PO) Take 1 tablet by mouth daily.    Marland Kitchen omeprazole (PRILOSEC) 20 MG capsule TAKE 1 CAPSULE BY MOUTH EVERY DAY 30 capsule 3  . omeprazole (PRILOSEC) 40 MG capsule Take 1 capsule (40 mg total) by mouth in the morning and at bedtime. 180 capsule 0  . Vitamin D, Ergocalciferol, (DRISDOL) 1.25 MG (50000 UNIT) CAPS capsule Take 1 capsule (50,000 Units total) by mouth every 7 (seven) days. (taking one tablet per week) walk in lab in office 1-2 weeks after completing prescription. 12 capsule 0   No current facility-administered medications for this visit.    Allergies as of 05/09/2020  . (No Known Allergies)    Review of Systems:    All systems reviewed and negative except where noted in HPI.  General Appearance:    Alert, cooperative, no distress, appears stated age  Head:    Normocephalic, without obvious abnormality, atraumatic  Eyes:    PERRL, conjunctiva/corneas clear,  Ears:    Grossly normal hearing    Neurologic:  Grossly normal    Observations/Objective:  Labs: CMP     Component Value Date/Time   NA 137 01/30/2020 1439   K 4.3 01/30/2020 1439  CL 103 01/30/2020 1439   CO2 22 01/30/2020 1439   GLUCOSE 98 01/30/2020 1439   BUN 10 01/30/2020 1439   CREATININE 0.99 01/30/2020 1439   CALCIUM 9.3 01/30/2020 1439   PROT 7.0 01/30/2020 1439   ALBUMIN 4.4 01/30/2020 1439   AST 21 01/30/2020 1439   ALT 21 01/30/2020 1439   ALKPHOS 71 01/30/2020 1439   BILITOT 0.4 01/30/2020 1439   GFRNONAA 73 01/30/2020 1439   GFRAA 84 01/30/2020 1439   Lab Results  Component Value Date   WBC 5.5 01/30/2020   HGB 13.3 01/30/2020   HCT 40.5 01/30/2020   MCV 88 01/30/2020   PLT 272 01/30/2020    Imaging Studies: NM Hepato  W/EF  Result Date: 05/07/2020 CLINICAL DATA:  Epigastric pain EXAM: NUCLEAR MEDICINE HEPATOBILIARY IMAGING WITH GALLBLADDER EF TECHNIQUE: Sequential images of the abdomen were obtained out to 60 minutes following intravenous administration of radiopharmaceutical. After oral ingestion of Ensure, gallbladder ejection fraction was determined. At 60 min, normal ejection fraction is greater than 33%. RADIOPHARMACEUTICALS:  5.18 mCi Tc-35m  Choletec IV COMPARISON:  None. FINDINGS: Prompt uptake and biliary excretion of activity by the liver is seen. Gallbladder activity is visualized, consistent with patency of cystic duct. Biliary activity passes into small bowel, consistent with patent common bile duct. Calculated gallbladder ejection fraction is 19%. (Normal gallbladder ejection fraction with Ensure is greater than 33%.) IMPRESSION: 1. Negative for acute gallbladder disease. 2. Decreased gallbladder ejection fraction of 19%. This can be seen with functional gallbladder disease in the appropriate clinical setting. Electronically Signed   By: Jasmine Pang M.D.   On: 05/07/2020 15:07    Assessment and Plan:   Jo Soto is a 40 y.o. y/o female here to follow-up to her initial visit when she presented with symptoms suggesting dyspepsia.  Since her initial visit her HIDA scan that showed a very low ejection fraction of 19% which could be contributing to her symptoms in addition endoscopy showed food in her stomach despite fasting for over 12 hours suggesting gastroparesis.  The GERD that the patient has could be very much related to the gastroparesis.  Plan 1.  Continue PPI 2.  Follow-up with Dr. Aleen Campi whom I have referred the patient to to consider cholecystectomy.  She has an appointment set up next week 3.  Gastroparesis diet 4.  Gastric emptying study 5.  If not feeling better at the next visit and has gastroparesis will consider Reglan    I discussed the assessment and treatment plan with the  patient. The patient was provided an opportunity to ask questions and all were answered. The patient agreed with the plan and demonstrated an understanding of the instructions.   The patient was advised to call back or seek an in-person evaluation if the symptoms worsen or if the condition fails to improve as anticipated.  I provided 18 minutes of face-to-face time during this encounter.  Dr Jo Mood MD,MRCP [facility]) Gastroenterology/Hepatology Pager: [phone]   Speech recognition software was used to dictate this note.

## 2020-05-09 NOTE — Patient Instructions (Signed)
Gastroparesis  Gastroparesis is a condition in which food takes longer than normal to empty from the stomach. This condition is also known as delayed gastric emptying. It is usually a long-term (chronic) condition. There is no cure, but there are treatments and things that you can do at home to help relieve symptoms. Treating the underlying condition that causes gastroparesis can also help relieve symptoms. What are the causes? In many cases, the cause of this condition is not known. Possible causes include:  A hormone (endocrine) disorder, such as hypothyroidism or diabetes.  A nervous system disease, such as Parkinson's disease or multiple sclerosis.  Cancer, infection, or surgery that affects the stomach or vagus nerve. The vagus nerve runs from your chest, through your neck, and to the lower part of your brain.  A connective tissue disorder, such as scleroderma.  Certain medicines. What increases the risk? You are more likely to develop this condition if:  You have certain disorders or diseases. These may include: ? An endocrine disorder. ? An eating disorder. ? Amyloidosis. ? Scleroderma. ? Parkinson's disease. ? Multiple sclerosis. ? Cancer or infection of the stomach or the vagus nerve.  You have had surgery on your stomach or vagus nerve.  You take certain medicines.  You are female. What are the signs or symptoms? Symptoms of this condition include:  Feeling full after eating very little or a loss of appetite.  Nausea, vomiting, or heartburn.  Bloating of your abdomen.  Inconsistent blood sugar (glucose) levels on blood tests.  Unexplained weight loss.  Acid from the stomach coming up into the esophagus (gastroesophageal reflux).  Sudden tightening (spasm) of the stomach, which can be painful. Symptoms may come and go. Some people may not notice any symptoms. How is this diagnosed? This condition is diagnosed with tests, such as:  Tests that check how  long it takes food to move through the stomach and intestines. These tests include: ? Upper gastrointestinal (GI) series. For this test, you drink a liquid that shows up well on X-rays, and then X-rays are taken of your intestines. ? Gastric emptying scintigraphy. For this test, you eat food that contains a small amount of radioactive material, and then scans are taken. ? Wireless capsule GI monitoring system. For this test, you swallow a pill (capsule) that records information about how foods and fluid move through your stomach.  Gastric manometry. For this test, a tube is passed down your throat and into your stomach to measure electrical and muscular activity.  Endoscopy. For this test, a long, thin tube with a camera and light on the end is passed down your throat and into your stomach to check for problems in your stomach lining.  Ultrasound. This test uses sound waves to create images of the inside of your body. This can help rule out gallbladder disease or pancreatitis as a cause of your symptoms. How is this treated? There is no cure for this condition, but treatment and home care may relieve symptoms. Treatment may include:  Treating the underlying cause.  Managing your symptoms by making changes to your diet and exercise habits.  Taking medicines to control nausea and vomiting and to stimulate stomach muscles.  Getting food through a feeding tube in the hospital. This may be done in severe cases.  Having surgery to insert a device called a gastric electrical stimulator into your body. This device helps improve stomach emptying and control nausea and vomiting. Follow these instructions at home:  Take over-the-counter and   prescription medicines only as told by your health care provider.  Follow instructions from your health care provider about eating or drinking restrictions. Your health care provider may recommend that you: ? Eat smaller meals more often. ? Eat low-fat  foods. ? Eat low-fiber forms of high-fiber foods. For example, eat cooked vegetables instead of raw vegetables. ? Have only liquid foods instead of solid foods. Liquid foods are easier to digest.  Drink enough fluid to keep your urine pale yellow.  Exercise as often as told by your health care provider.  Keep all follow-up visits. This is important. Contact a health care provider if you:  Notice that your symptoms do not improve with treatment.  Have new symptoms. Get help right away if you:  Have severe pain in your abdomen that does not improve with treatment.  Have nausea that is severe or does not go away.  Vomit every time you drink fluids. Summary  Gastroparesis is a long-term (chronic) condition in which food takes longer than normal to empty from the stomach.  Symptoms include nausea, vomiting, heartburn, bloating of your abdomen, and loss of appetite.  Eating smaller portions, low-fat foods, and low-fiber forms of high-fiber foods may help you manage your symptoms.  Get help right away if you have severe pain in your abdomen. This information is not intended to replace advice given to you by your health care provider. Make sure you discuss any questions you have with your health care provider. Document Revised: 07/17/2019 Document Reviewed: 07/17/2019 Elsevier Patient Education  2021 [employer] Inc.  

## 2020-05-15 ENCOUNTER — Ambulatory Visit (INDEPENDENT_AMBULATORY_CARE_PROVIDER_SITE_OTHER): Payer: BC Managed Care – PPO | Admitting: Surgery

## 2020-05-15 ENCOUNTER — Encounter: Payer: Self-pay | Admitting: Surgery

## 2020-05-15 ENCOUNTER — Other Ambulatory Visit: Payer: Self-pay

## 2020-05-15 ENCOUNTER — Ambulatory Visit (HOSPITAL_COMMUNITY): Payer: BC Managed Care – PPO

## 2020-05-15 VITALS — BP 144/98 | HR 80 | Temp 98.1°F | Ht 63.0 in | Wt 220.0 lb

## 2020-05-15 DIAGNOSIS — K828 Other specified diseases of gallbladder: Secondary | ICD-10-CM | POA: Diagnosis not present

## 2020-05-15 NOTE — Progress Notes (Signed)
05/15/2020  Reason for Visit:  Biliary Dyskinesia  Referring Provider:  Wyline Mood, MD  History of Present Illness: Jo Soto is a 40 y.o. female presenting for evaluation of biliary dyskinesia.  The patient reports she's been dealing with abdominal pain since about July or August of 2021.  She has been seen by Dr. Tobi Bastos and she's been on Protonix and had a recent EGD.  EGD showed food in the stomach even after many hours of fasting, suggesting possible gastric motility issues.  As part of the workup, she also had a HIDA scan which showed patent biliary anatomy but an EF of 19%.    Patient reports that when she eats, she feels full early, feels epigastric and RUQ pain, nausea.  Sometimes she feels the food goes through her very quickly needing to have a bowel movement.  Denies any fevers, chills, chest pain, shortness of breath.  She's scheduled for a gastric emptying study on 05/29/20.  Past Medical History: Past Medical History:  Diagnosis Date  . Allergy   . Anxiety   . Depression   . Heartburn   . Migraines      Past Surgical History: Past Surgical History:  Procedure Laterality Date  . ANTERIOR CRUCIATE LIGAMENT REPAIR  12/2017  . ESOPHAGOGASTRODUODENOSCOPY (EGD) WITH PROPOFOL N/A 05/06/2020   Procedure: ESOPHAGOGASTRODUODENOSCOPY (EGD) WITH PROPOFOL;  Surgeon: Wyline Mood, MD;  Location[facility] ENDOSCOPY;  Service: Gastroenterology;  Laterality: N/A;  COVID POSITIVE 04/03/2020  . TUMOR REMOVAL  12/2014   Abdominal    Home Medications: Prior to Admission medications   Medication Sig Start Date End Date Taking? Authorizing Provider  benzonatate (TESSALON) 100 MG capsule Take 2 capsules (200 mg total) by mouth every 8 (eight) hours. 04/03/20  Yes Bast, Traci A, NP  buPROPion (WELLBUTRIN XL) 300 MG 24 hr tablet Take 1 tablet (300 mg total) by mouth daily. 03/05/20  Yes Flinchum, Eula Fried, FNP  CHLOROPHYLL PO Take 1 tablet by mouth daily.   Yes [provider]   dicyclomine (BENTYL) 10 MG capsule Take 1 capsule (10 mg total) by mouth 4 (four) times daily -  before meals and at bedtime. 04/23/20  Yes Wyline Mood, MD  levonorgestrel [facility]) 20 MCG/24HR IUD 1 each by Intrauterine route once.   Yes [provider]  Multiple Vitamin (DAILY-VITAMIN PO) Take 1 tablet by mouth daily.   Yes [provider]  omeprazole (PRILOSEC) 40 MG capsule Take 1 capsule (40 mg total) by mouth in the morning and at bedtime. 04/23/20  Yes Wyline Mood, MD  Vitamin D, Ergocalciferol, (DRISDOL) 1.25 MG (50000 UNIT) CAPS capsule Take 1 capsule (50,000 Units total) by mouth every 7 (seven) days. (taking one tablet per week) walk in lab in office 1-2 weeks after completing prescription. 03/06/20  Yes Flinchum, Eula Fried, FNP    Allergies: No Known Allergies  Social History:  reports that she has been smoking. She has a 0.80 pack-year smoking history. She has never used smokeless tobacco. She reports current alcohol use of about 9.0 standard drinks of alcohol per week. She reports that she does not use drugs.   Family History: Family History  Problem Relation Age of Onset  . Anemia Mother   . Cancer Mother   . Diabetes Mother   . Heart disease Sister   . Diabetes Maternal Aunt   . Diabetes Maternal Uncle     Review of Systems: Review of Systems  Constitutional: Negative for chills and fever.  HENT: Negative for hearing  loss.   Respiratory: Negative for shortness of breath.   Cardiovascular: Negative for chest pain.  Gastrointestinal: Positive for abdominal pain, diarrhea and nausea. Negative for constipation and vomiting.  Genitourinary: Negative for dysuria.  Musculoskeletal: Negative for myalgias.  Skin: Negative for rash.  Neurological: Negative for dizziness.  Psychiatric/Behavioral: Negative for depression.    Physical Exam BP (!) 144/98   Pulse 80   Temp 98.1 F (36.7 C)   Ht 5\' 3"  (1.6 m)   Wt 220 lb (99.8 kg)   SpO2 98%   BMI 38.97  kg/m  CONSTITUTIONAL: No acute distress HEENT:  Normocephalic, atraumatic, extraocular motion intact. NECK: Trachea is midline, and there is no jugular venous distension.  RESPIRATORY:  Lungs are clear, and breath sounds are equal bilaterally. Normal respiratory effort without pathologic use of accessory muscles. CARDIOVASCULAR: Heart is regular without murmurs, gallops, or rubs. GI: The abdomen is soft, obese, non-distended, with some discomfort/tenderness to palpation in the epigastric and RUQ areas.  Negative Murphy's sign. MUSCULOSKELETAL:  Normal muscle strength and tone in all four extremities.  No peripheral edema or cyanosis. SKIN: Skin turgor is normal. There are no pathologic skin lesions.  NEUROLOGIC:  Motor and sensation is grossly normal.  Cranial nerves are grossly intact. PSYCH:  Alert and oriented to person, place and time. Affect is normal.  Laboratory Analysis: No results found for this or any previous visit (from the past 24 hour(s)).  Imaging: HIDA scan 05/07/20: IMPRESSION: 1. Negative for acute gallbladder disease. 2. Decreased gallbladder ejection fraction of 19%. This can be seen with functional gallbladder disease in the appropriate clinical setting.  Assessment and Plan: This is a 40 y.o. female with abdominal pain, nausea, and diagnosis of biliary dyskinesia  --Discussed with the patient that she does have biliary dyskinesia with reduced EF during the study.  I think this is contributing to an extent to the symptoms that she's having. Although gastric dysmotility could also contribute to that, I do think she may have a combination of both issues. --Given that, I think she would be a good candidate for robotic approach.  Discussed with her the role for robotic cholecystectomy, including reviewing the risks of bleeding, infection, and injury to surrounding structures, as well as post-op recovery and activity restrictions. She's willing to proceed and wants to start  feeling better. --She has a gastric emptying study on 05/29/20.  We'll schedule her for 06/06/20.  She understands that she would need a COVID-19 test prior to surgery.  All of her questions have been answered.  Face-to-face time spent with the patient and care providers was 60 minutes, with more than 50% of the time spent counseling, educating, and coordinating care of the patient.     06/08/20, MD [facility]

## 2020-05-15 NOTE — Patient Instructions (Addendum)
You have requested to have your gallbladder removed. This will be done at [facility] with Dr. Aleen Campi.    You will most likely be out of work 1-2 weeks for this surgery. You will return after your post-op appointment with a lifting restriction for approximately 3-4 more weeks.  You will be able to eat anything you would like to following surgery. But, start by eating a bland diet and advance this as tolerated. The Gallbladder diet is below, please go as closely by this diet as possible prior to surgery to avoid any further attacks.  Please see the (blue)pre-care form that you have been given today. Our surgery scheduler will call you to verify surgery date and to go over information.   If you have any questions, please call our office.  Laparoscopic Cholecystectomy Laparoscopic cholecystectomy is surgery to remove the gallbladder. The gallbladder is located in the upper right part of the abdomen, behind the liver. It is a storage sac for bile, which is produced in the liver. Bile aids in the digestion and absorption of fats. Cholecystectomy is often done for inflammation of the gallbladder (cholecystitis). This condition is usually caused by a buildup of gallstones (cholelithiasis) in the gallbladder. Gallstones can block the flow of bile, and that can result in inflammation and pain. In severe cases, emergency surgery may be required. If emergency surgery is not required, you will have time to prepare for the procedure. Laparoscopic surgery is an alternative to open surgery. Laparoscopic surgery has a shorter recovery time. Your common bile duct may also need to be examined during the procedure. If stones are found in the common bile duct, they may be removed. LET [facility] CARE PROVIDER KNOW ABOUT:  Any allergies you have.  All medicines you are taking, including vitamins, herbs, eye drops, creams, and over-the-counter medicines.  Previous problems you or members of your family have had  with the use of anesthetics.  Any blood disorders you have.  Previous surgeries you have had.    Any medical conditions you have. RISKS AND COMPLICATIONS Generally, this is a safe procedure. However, problems may occur, including:  Infection.  Bleeding.  Allergic reactions to medicines.  Damage to other structures or organs.  A stone remaining in the common bile duct.  A bile leak from the cyst duct that is clipped when your gallbladder is removed.  The need to convert to open surgery, which requires a larger incision in the abdomen. This may be necessary if your surgeon thinks that it is not safe to continue with a laparoscopic procedure. BEFORE THE PROCEDURE  Ask your health care provider about:  Changing or stopping your regular medicines. This is especially important if you are taking diabetes medicines or blood thinners.  Taking medicines such as aspirin and ibuprofen. These medicines can thin your blood. Do not take these medicines before your procedure if your health care provider instructs you not to.  Follow instructions from your health care provider about eating or drinking restrictions.  Let your health care provider know if you develop a cold or an infection before surgery.  Plan to have someone take you home after the procedure.  Ask your health care provider how your surgical site will be marked or identified.  You may be given antibiotic medicine to help prevent infection. PROCEDURE  To reduce your risk of infection:  Your health care team will wash or sanitize their hands.  Your skin will be washed with soap.  An IV tube  may be inserted into one of your veins.  You will be given a medicine to make you fall asleep (general anesthetic).  A breathing tube will be placed in your mouth.  The surgeon will make several small cuts (incisions) in your abdomen.  A thin, lighted tube (laparoscope) that has a tiny camera on the end will be inserted  through one of the small incisions. The camera on the laparoscope will send a picture to a TV screen (monitor) in the operating room. This will give the surgeon a good view inside your abdomen.  A gas will be pumped into your abdomen. This will expand your abdomen to give the surgeon more room to perform the surgery.  Other tools that are needed for the procedure will be inserted through the other incisions. The gallbladder will be removed through one of the incisions.  After your gallbladder has been removed, the incisions will be closed with stitches (sutures), staples, or skin glue.  Your incisions may be covered with a bandage (dressing). The procedure may vary among health care providers and hospitals. AFTER THE PROCEDURE  Your blood pressure, heart rate, breathing rate, and blood oxygen level will be monitored often until the medicines you were given have worn off.  You will be given medicines as needed to control your pain.   This information is not intended to replace advice given to you by your health care provider. Make sure you discuss any questions you have with your health care provider.   Document Released: 03/09/2005 Document Revised: 11/28/2014 Document Reviewed: 10/19/2012 Elsevier Interactive Patient Education 2016 [place] Diet for Gallbladder Conditions A low-fat diet can be helpful if you have pancreatitis or a gallbladder condition. With these conditions, your pancreas and gallbladder have trouble digesting fats. A healthy eating plan with less fat will help rest your pancreas and gallbladder and reduce your symptoms. WHAT DO I NEED TO KNOW ABOUT THIS DIET?  Eat a low-fat diet.  Reduce your fat intake to less than 20-30% of your total daily calories. This is less than 50-60 g of fat per day.  Remember that you need some fat in your diet. Ask your dietician what your daily goal should be.  Choose nonfat and low-fat healthy foods. Look for the words  "nonfat," "low fat," or "fat free."  As a guide, look on the label and choose foods with less than 3 g of fat per serving. Eat only one serving.  Avoid alcohol.  Do not smoke. If you need help quitting, talk with your health care provider.  Eat small frequent meals instead of three large heavy meals. WHAT FOODS CAN I EAT? Grains Include healthy grains and starches such as potatoes, wheat bread, fiber-rich cereal, and brown rice. Choose whole grain options whenever possible. In adults, whole grains should account for 45-65% of your daily calories.  Fruits and Vegetables Eat plenty of fruits and vegetables. Fresh fruits and vegetables add fiber to your diet. Meats and Other Protein Sources Eat lean meat such as chicken and pork. Trim any fat off of meat before cooking it. Eggs, fish, and beans are other sources of protein. In adults, these foods should account for 10-35% of your daily calories. Dairy Choose low-fat milk and dairy options. Dairy includes fat and protein, as well as calcium.  Fats and Oils Limit high-fat foods such as fried foods, sweets, baked goods, sugary drinks.  Other Creamy sauces and condiments, such as mayonnaise, can add extra fat. Think  about whether or not you need to use them, or use smaller amounts or low fat options. WHAT FOODS ARE NOT RECOMMENDED?  High fat foods, such as:  [employer].  Ice cream.  [place] toast.  Sweet rolls.  Pizza.  Cheese bread.  Foods covered with batter, butter, creamy sauces, or cheese.  Fried foods.  Sugary drinks and desserts.  Foods that cause gas or bloating   This information is not intended to replace advice given to you by your health care provider. Make sure you discuss any questions you have with your health care provider.   Document Released: 03/14/2013 Document Reviewed: 03/14/2013 Elsevier Interactive Patient Education [employer].

## 2020-05-16 ENCOUNTER — Telehealth: Payer: Self-pay | Admitting: Surgery

## 2020-05-16 DIAGNOSIS — T162XXA Foreign body in left ear, initial encounter: Secondary | ICD-10-CM | POA: Diagnosis not present

## 2020-05-16 NOTE — Telephone Encounter (Signed)
Patient has been advised of Pre-Admission date/time, COVID Testing date and Surgery date.  Surgery Date: 06/06/20 Preadmission Testing Date: 05/28/20 (phone 8a-1p) Covid Testing Date: 06/04/20 - patient advised to [facility] ([address] [facility]) between 8a-1p  Patient has been made aware to call [phone], between 1-3:00pm the day before surgery, to find out what time to arrive for surgery.

## 2020-05-21 ENCOUNTER — Encounter: Payer: Self-pay | Admitting: Gastroenterology

## 2020-05-28 ENCOUNTER — Encounter
Admission: RE | Admit: 2020-05-28 | Discharge: 2020-05-28 | Disposition: A | Discharge status: Home / Self Care | Payer: BC Managed Care – PPO | Source: Ambulatory Visit | Attending: Surgery | Admitting: Surgery

## 2020-05-28 ENCOUNTER — Other Ambulatory Visit: Payer: Self-pay

## 2020-05-28 DIAGNOSIS — Z01812 Encounter for preprocedural laboratory examination: Secondary | ICD-10-CM | POA: Diagnosis not present

## 2020-05-28 HISTORY — DX: Gastro-esophageal reflux disease without esophagitis: K21.9

## 2020-05-28 NOTE — Patient Instructions (Signed)
Your procedure is scheduled on: Thursday 06/06/20.  Report to THE FIRST FLOOR REGISTRATION DESK IN THE MEDICAL MALL ON THE MORNING OF SURGERY FIRST, THEN YOU WILL CHECK IN AT THE SURGERY INFORMATION DESK LOCATED OUTSIDE THE SAME DAY SURGERY DEPARTMENT LOCATED ON 2ND FLOOR MEDICAL MALL ENTRANCE.  To find out your arrival time please call [phone] between 1PM - 3PM on Wednesday 06/05/20.   Remember: Instructions that are not followed completely may result in serious medical risk, up to and including death, or upon the discretion of your surgeon and anesthesiologist your surgery may need to be rescheduled.     __X__ 1. Do not eat food after midnight the night before your procedure.                 No gum chewing or hard candies. You may drink clear liquids up to 2 hours                 before you are scheduled to arrive for your surgery- DO NOT drink clear                 liquids within 2 hours of the start of your surgery.                 Clear Liquids include:  water, apple juice without pulp, clear carbohydrate                 drink such as Clearfast or Gatorade, Black Coffee or Tea (Do not add                 milk or creamer to coffee or tea).  __X__2.  On the morning of surgery brush your teeth with toothpaste and water, you may rinse your mouth with mouthwash if you wish.  Do not swallow any toothpaste or mouthwash.    __X__ 3.  No Alcohol for 24 hours before or after surgery.  __X__ 4.  Do Not Smoke or use e-cigarettes For 24 Hours Prior to Your Surgery.                 Do not use any chewable tobacco products for at least 6 hours prior to                 surgery.  __X__5.  Notify your doctor if there is any change in your medical condition      (cold, fever, infections).      Do NOT wear jewelry, make-up, hairpins, clips or nail polish. Do NOT wear lotions, powders, or perfumes.  Do NOT shave 48 hours prior to surgery. Men may shave face and neck. Do NOT bring valuables to  the hospital.     [facility] is not responsible for any belongings or valuables.   Contacts, dentures/partials or body piercings may not be worn into surgery. Bring a case for your contacts, glasses or hearing aids, a denture cup will be supplied. Leave your suitcase in the car. After surgery it may be brought to your room.   For patients admitted to the hospital, discharge time is determined by your treatment team.    Patients discharged the day of surgery will not be allowed to drive home.     __X__ Take these medicines the morning of surgery with A SIP OF WATER:     1. buPROPion (WELLBUTRIN XL)  2. omeprazole (PRILOSEC)  3. dicyclomine (BENTYL)     __X__ Use CHG Soap as directed.  __X__ Stop Anti-inflammatories 7 days before surgery such as Advil, Ibuprofen, Motrin, BC or Goodies Powder, Naprosyn, Naproxen, Aleve, Aspirin, Meloxicam. May take Tylenol if needed for pain or discomfort.   __X__Do not start taking any new herbal supplements or vitamins prior to your procedure.    Wear comfortable clothing (specific to your surgery type) to the hospital.  Plan for stool softeners for home use; pain medications have a tendency to cause constipation. You can also help prevent constipation by eating foods high in fiber such as fruits and vegetables and drinking plenty of fluids as your diet allows.  After surgery, you can prevent lung complications by doing breathing exercises.Take deep breaths and cough every 1-2 hours. Your doctor may order a device called an Incentive Spirometer to help you take deep breaths.  Please call the [place] Department at [phone] if you have any questions about these instructions

## 2020-05-29 ENCOUNTER — Ambulatory Visit (HOSPITAL_COMMUNITY)
Admission: RE | Admit: 2020-05-29 | Discharge: 2020-05-29 | Disposition: A | Discharge status: Home / Self Care | Payer: BC Managed Care – PPO | Source: Ambulatory Visit | Attending: Gastroenterology | Admitting: Gastroenterology

## 2020-05-29 DIAGNOSIS — K3184 Gastroparesis: Secondary | ICD-10-CM | POA: Diagnosis not present

## 2020-05-29 DIAGNOSIS — Z0389 Encounter for observation for other suspected diseases and conditions ruled out: Secondary | ICD-10-CM | POA: Diagnosis not present

## 2020-05-29 MED ORDER — TECHNETIUM TC 99M SULFUR COLLOID
2.0000 | Freq: Once | INTRAVENOUS | Status: AC | PRN
  Administered 2020-05-29: 2 via INTRAVENOUS

## 2020-05-30 ENCOUNTER — Encounter: Payer: Self-pay | Admitting: Gastroenterology

## 2020-05-30 ENCOUNTER — Other Ambulatory Visit: Payer: Self-pay | Admitting: Adult Health

## 2020-05-30 NOTE — Telephone Encounter (Signed)
Requested medications are due for refill today.  yes  Requested medications are on the active medications list.  yes  Last refill. 02/25/2020  Future visit scheduled.   yes  Notes to clinic.  Not delegated.

## 2020-06-01 ENCOUNTER — Other Ambulatory Visit: Payer: Self-pay | Admitting: Adult Health

## 2020-06-01 NOTE — Telephone Encounter (Signed)
Requested Prescriptions  Pending Prescriptions Disp Refills  . buPROPion (WELLBUTRIN XL) 300 MG 24 hr tablet [Pharmacy Med Name: BUPROPION HCL XL 300 MG TABLET] 90 tablet 0    Sig: TAKE 1 TABLET BY MOUTH EVERY DAY     Psychiatry: Antidepressants - bupropion Failed - 06/01/2020  8:57 AM      Failed - Last BP in normal range    BP Readings from Last 1 Encounters:  05/15/20 (!) 144/98         Passed - Valid encounter within last 6 months    Recent Outpatient Visits          2 months ago Anxiety   [facility] Flinchum, Eula Fried, FNP   4 months ago Anxiety   [facility] Flinchum, Eula Fried, FNP      Future Appointments            In 2 days Flinchum, Eula Fried, FNP [facility]   In 2 months Wyline Mood, MD [facility] 

## 2020-06-03 ENCOUNTER — Encounter: Payer: Self-pay | Admitting: Adult Health

## 2020-06-03 ENCOUNTER — Ambulatory Visit (INDEPENDENT_AMBULATORY_CARE_PROVIDER_SITE_OTHER): Payer: BC Managed Care – PPO | Admitting: Adult Health

## 2020-06-03 ENCOUNTER — Other Ambulatory Visit: Payer: Self-pay

## 2020-06-03 VITALS — BP 136/93 | HR 75 | Temp 98.9°F | Resp 16 | Wt 220.8 lb

## 2020-06-03 DIAGNOSIS — K219 Gastro-esophageal reflux disease without esophagitis: Secondary | ICD-10-CM

## 2020-06-03 DIAGNOSIS — Z6839 Body mass index (BMI) 39.0-39.9, adult: Secondary | ICD-10-CM

## 2020-06-03 DIAGNOSIS — Z6841 Body Mass Index (BMI) 40.0 and over, adult: Secondary | ICD-10-CM

## 2020-06-03 DIAGNOSIS — Z2821 Immunization not carried out because of patient refusal: Secondary | ICD-10-CM

## 2020-06-03 DIAGNOSIS — R5383 Other fatigue: Secondary | ICD-10-CM | POA: Diagnosis not present

## 2020-06-03 DIAGNOSIS — K311 Adult hypertrophic pyloric stenosis: Secondary | ICD-10-CM

## 2020-06-03 DIAGNOSIS — E559 Vitamin D deficiency, unspecified: Secondary | ICD-10-CM

## 2020-06-03 MED ORDER — OMEPRAZOLE 40 MG PO CPDR: 40.0000 mg | DELAYED_RELEASE_CAPSULE | Freq: Two times a day (BID) | ORAL | 0 refills | Status: DC

## 2020-06-03 MED ORDER — BUPROPION HCL ER (XL) 300 MG PO TB24: 300.0000 mg | ORAL_TABLET | Freq: Every day | ORAL | 1 refills | Status: DC

## 2020-06-03 NOTE — Patient Instructions (Signed)
Bupropion extended-release tablets (Depression/Mood Disorders) What is this medicine? BUPROPION (byoo PROE pee on) is used to treat depression. This medicine may be used for other purposes; ask your health care provider or pharmacist if you have questions. COMMON BRAND NAME(S): Aplenzin, Budeprion XL, Forfivo XL, Wellbutrin XL What should I tell my health care provider before I take this medicine? They need to know if you have any of these conditions:  an eating disorder, such as anorexia or bulimia  bipolar disorder or psychosis  diabetes or high blood sugar, treated with medication  glaucoma  head injury or brain tumor  heart disease, previous heart attack, or irregular heart beat  high blood pressure  kidney or liver disease  seizures (convulsions)  suicidal thoughts or a previous suicide attempt  Tourette's syndrome  weight loss  an unusual or allergic reaction to bupropion, other medicines, foods, dyes, or preservatives  breast-feeding  pregnant or trying to become pregnant How should I use this medicine? Take this medicine by mouth with a glass of water. Follow the directions on the prescription label. You can take it with or without food. If it upsets your stomach, take it with food. Do not crush, chew, or cut these tablets. This medicine is taken once daily at the same time each day. Do not take your medicine more often than directed. Do not stop taking this medicine suddenly except upon the advice of your doctor. Stopping this medicine too quickly may cause serious side effects or your condition may worsen. A special MedGuide will be given to you by the pharmacist with each prescription and refill. Be sure to read this information carefully each time. Talk to your pediatrician regarding the use of this medicine in children. Special care may be needed. Overdosage: If you think you have taken too much of this medicine contact a poison control center or emergency room  at once. NOTE: This medicine is only for you. Do not share this medicine with others. What if I miss a dose? If you miss a dose, skip the missed dose and take your next tablet at the regular time. Do not take double or extra doses. What may interact with this medicine? Do not take this medicine with any of the following medications:  linezolid  MAOIs like Azilect, Carbex, Eldepryl, Marplan, Nardil, and Parnate  methylene blue (injected into a vein)  other medicines that contain bupropion like Zyban This medicine may also interact with the following medications:  alcohol  certain medicines for anxiety or sleep  certain medicines for blood pressure like metoprolol, propranolol  certain medicines for depression or psychotic disturbances  certain medicines for HIV or AIDS like efavirenz, lopinavir, nelfinavir, ritonavir  certain medicines for irregular heart beat like propafenone, flecainide  certain medicines for Parkinson's disease like amantadine, levodopa  certain medicines for seizures like carbamazepine, phenytoin, phenobarbital  cimetidine  clopidogrel  cyclophosphamide  digoxin  furazolidone  isoniazid  nicotine  orphenadrine  procarbazine  steroid medicines like prednisone or cortisone  stimulant medicines for attention disorders, weight loss, or to stay awake  tamoxifen  theophylline  thiotepa  ticlopidine  tramadol  warfarin This list may not describe all possible interactions. Give your health care provider a list of all the medicines, herbs, non-prescription drugs, or dietary supplements you use. Also tell them if you smoke, drink alcohol, or use illegal drugs. Some items may interact with your medicine. What should I watch for while using this medicine? Tell your doctor if your symptoms do   not get better or if they get worse. Visit your doctor or healthcare provider for regular checks on your progress. Because it may take several weeks to  see the full effects of this medicine, it is important to continue your treatment as prescribed by your doctor. This medicine may cause serious skin reactions. They can happen weeks to months after starting the medicine. Contact your healthcare provider right away if you notice fevers or flu-like symptoms with a rash. The rash may be red or purple and then turn into blisters or peeling of the skin. Or, you might notice a red rash with swelling of the face, lips or lymph nodes in your neck or under your arms. Patients and their families should watch out for new or worsening thoughts of suicide or depression. Also watch out for sudden changes in feelings such as feeling anxious, agitated, panicky, irritable, hostile, aggressive, impulsive, severely restless, overly excited and hyperactive, or not being able to sleep. If this happens, especially at the beginning of treatment or after a change in dose, call your healthcare provider. Avoid alcoholic drinks while taking this medicine. Drinking large amounts of alcoholic beverages, using sleeping or anxiety medicines, or quickly stopping the use of these agents while taking this medicine may increase your risk for a seizure. Do not drive or use heavy machinery until you know how this medicine affects you. This medicine can impair your ability to perform these tasks. Do not take this medicine close to bedtime. It may prevent you from sleeping. Your mouth may get dry. Chewing sugarless gum or sucking hard candy, and drinking plenty of water may help. Contact your doctor if the problem does not go away or is severe. The tablet shell for some brands of this medicine does not dissolve. This is normal. The tablet shell may appear whole in the stool. This is not a cause for concern. What side effects may I notice from receiving this medicine? Side effects that you should report to your doctor or health care professional as soon as possible:  allergic reactions like  skin rash, itching or hives, swelling of the face, lips, or tongue  breathing problems  changes in vision  confusion  elevated mood, decreased need for sleep, racing thoughts, impulsive behavior  fast or irregular heartbeat  hallucinations, loss of contact with reality  increased blood pressure  rash, fever, and swollen lymph nodes  redness, blistering, peeling or loosening of the skin, including inside the mouth  seizures  suicidal thoughts or other mood changes  unusually weak or tired  vomiting Side effects that usually do not require medical attention (report to your doctor or health care professional if they continue or are bothersome):  constipation  headache  loss of appetite  nausea  tremors  weight loss This list may not describe all possible side effects. Call your doctor for medical advice about side effects. You may report side effects to FDA at 1-800-FDA-1088. Where should I keep my medicine? Keep out of the reach of children. Store at room temperature between 15 and 30 degrees C (59 and 86 degrees F). Throw away any unused medicine after the expiration date. NOTE: This sheet is a summary. It may not cover all possible information. If you have questions about this medicine, talk to your doctor, pharmacist, or health care provider.  2021 Elsevier/Gold Standard (2018-06-02 13:45:31) Health Maintenance, Female Adopting a healthy lifestyle and getting preventive care are important in promoting health and wellness. Ask your health care provider  about:  The right schedule for you to have regular tests and exams.  Things you can do on your own to prevent diseases and keep yourself healthy. What should I know about diet, weight, and exercise? Eat a healthy diet  Eat a diet that includes plenty of vegetables, fruits, low-fat dairy products, and lean protein.  Do not eat a lot of foods that are high in solid fats, added sugars, or sodium.   Maintain a  healthy weight Body mass index (BMI) is used to identify weight problems. It estimates body fat based on height and weight. Your health care provider can help determine your BMI and help you achieve or maintain a healthy weight. Get regular exercise Get regular exercise. This is one of the most important things you can do for your health. Most adults should:  Exercise for at least 150 minutes each week. The exercise should increase your heart rate and make you sweat (moderate-intensity exercise).  Do strengthening exercises at least twice a week. This is in addition to the moderate-intensity exercise.  Spend less time sitting. Even light physical activity can be beneficial. Watch cholesterol and blood lipids Have your blood tested for lipids and cholesterol at 38 years of age, then have this test every 5 years. Have your cholesterol levels checked more often if:  Your lipid or cholesterol levels are high.  You are older than 39 years of age.  You are at high risk for heart disease. What should I know about cancer screening? Depending on your health history and family history, you may need to have cancer screening at various ages. This may include screening for:  Breast cancer.  Cervical cancer.  Colorectal cancer.  Skin cancer.  Lung cancer. What should I know about heart disease, diabetes, and high blood pressure? Blood pressure and heart disease  High blood pressure causes heart disease and increases the risk of stroke. This is more likely to develop in people who have high blood pressure readings, are of African descent, or are overweight.  Have your blood pressure checked: ? Every 3-5 years if you are 26-73 years of age. ? Every year if you are 25 years old or older. Diabetes Have regular diabetes screenings. This checks your fasting blood sugar level. Have the screening done:  Once every three years after age 45 if you are at a normal weight and have a low risk for  diabetes.  More often and at a younger age if you are overweight or have a high risk for diabetes. What should I know about preventing infection? Hepatitis B If you have a higher risk for hepatitis B, you should be screened for this virus. Talk with your health care provider to find out if you are at risk for hepatitis B infection. Hepatitis C Testing is recommended for:  Everyone born from [birth year] through 1964.  Anyone with known risk factors for hepatitis C. Sexually transmitted infections (STIs)  Get screened for STIs, including gonorrhea and chlamydia, if: ? You are sexually active and are younger than 39 years of age. ? You are older than 39 years of age and your health care provider tells you that you are at risk for this type of infection. ? Your sexual activity has changed since you were last screened, and you are at increased risk for chlamydia or gonorrhea. Ask your health care provider if you are at risk.  Ask your health care provider about whether you are at high risk for HIV. Your health  care provider may recommend a prescription medicine to help prevent HIV infection. If you choose to take medicine to prevent HIV, you should first get tested for HIV. You should then be tested every 3 months for as long as you are taking the medicine. Pregnancy  If you are about to stop having your period (premenopausal) and you may become pregnant, seek counseling before you get pregnant.  Take 400 to 800 micrograms (mcg) of folic acid every day if you become pregnant.  Ask for birth control (contraception) if you want to prevent pregnancy. Osteoporosis and menopause Osteoporosis is a disease in which the bones lose minerals and strength with aging. This can result in bone fractures. If you are 65 years old or older, or if you are at risk for osteoporosis and fractures, ask your health care provider if you should:  Be screened for bone loss.  Take a calcium or vitamin D supplement to lower  your risk of fractures.  Be given hormone replacement therapy (HRT) to treat symptoms of menopause. Follow these instructions at home: Lifestyle  Do not use any products that contain nicotine or tobacco, such as cigarettes, e-cigarettes, and chewing tobacco. If you need help quitting, ask your health care provider.  Do not use street drugs.  Do not share needles.  Ask your health care provider for help if you need support or information about quitting drugs. Alcohol use  Do not drink alcohol if: ? Your health care provider tells you not to drink. ? You are pregnant, may be pregnant, or are planning to become pregnant.  If you drink alcohol: ? Limit how much you use to 0-1 drink a day. ? Limit intake if you are breastfeeding.  Be aware of how much alcohol is in your drink. In the [place] one drink equals one 12 oz bottle of beer (355 mL), one 5 oz glass of wine (148 mL), or one 1 oz glass of hard liquor (44 mL). General instructions  Schedule regular health, dental, and eye exams.  Stay current with your vaccines.  Tell your health care provider if: ? You often feel depressed. ? You have ever been abused or do not feel safe at home. Summary  Adopting a healthy lifestyle and getting preventive care are important in promoting health and wellness.  Follow your health care provider's instructions about healthy diet, exercising, and getting tested or screened for diseases.  Follow your health care provider's instructions on monitoring your cholesterol and blood pressure. This information is not intended to replace advice given to you by your health care provider. Make sure you discuss any questions you have with your health care provider. Document Revised: 03/02/2018 Document Reviewed: 03/02/2018 Elsevier Patient Education  2021 [employer].

## 2020-06-03 NOTE — Progress Notes (Signed)
Established patient visit   Patient: Jo Soto   DOB: 04-21-1980   40 y.o. Female  MRN: [id number] Visit Date: 06/03/2020  Today's healthcare provider: Marcille Buffy, FNP   Chief Complaint  Patient presents with  . Anxiety   Subjective    HPI  Anxiety, Follow-up  She was last seen for anxiety 3 months ago. Changes made at last visit include started patient on Wellbutrin XL 361m.   She reports excellent compliance with treatment. She reports good tolerance of treatment. She is not having side effects.   She feels her anxiety is mild and Unchanged since last visit.  Symptoms: No chest pain No difficulty concentrating  No dizziness Yes fatigue  No feelings of losing control Yes insomnia  No irritable No palpitations  No panic attacks No racing thoughts  No shortness of breath No sweating  No tremors/shakes    She has upcoming gallbladder surgery coming up. Needs FMLA for intermittent abscense due to gallbladder and anxiety.   Patient  denies any fever, body aches,chills, rash, chest pain, shortness of breath, vomiting, or diarrhea.  Denies dizziness, lightheadedness, pre syncopal or syncopal episodes.   GAD-7 Results No flowsheet data found.  PHQ-9 Scores PHQ9 SCORE ONLY 01/30/2020  PHQ-9 Total Score 12    ---------------------------------------------------------------------------------------------------  Patient Active Problem List   Diagnosis Date Noted  . COVID-19 vaccination declined 06/05/2020  . Gastric outlet obstruction 04/23/2020  . Irritable bowel syndrome without diarrhea 03/05/2020  . Gastroesophageal reflux disease without esophagitis 03/05/2020  . Anxiety 01/30/2020  . Vitamin D insufficiency 01/30/2020  . Screening for blood or protein in urine 01/30/2020  . Snoring 01/30/2020  . Excessive sleepiness 01/30/2020  . Diarrhea 12/08/2019  . Gastritis 12/08/2019  . Mild intermittent asthma without complication 040/98/1191 .  Class 3 severe obesity due to excess calories without serious comorbidity with body mass index (BMI) of 40.0 to 44.9 in adult ([place] 12/08/2019  . Insomnia 12/08/2019   Past Medical History:  Diagnosis Date  . Allergy   . Anxiety   . Depression   . GERD (gastroesophageal reflux disease)   . Heartburn   . Migraines    No Known Allergies     Medications: Outpatient Medications Prior to Visit  Medication Sig  . dicyclomine (BENTYL) 10 MG capsule Take 1 capsule (10 mg total) by mouth 4 (four) times daily -  before meals and at bedtime. (Patient taking differently: Take 20 mg by mouth in the morning and at bedtime.)  . levonorgestrel (MIRENA) 20 MCG/24HR IUD 1 each by Intrauterine route once.  . Multiple Vitamin (DAILY-VITAMIN PO) Take 1 tablet by mouth daily.  . Multiple Vitamins-Minerals (HAIR/SKIN/NAILS) TABS Take 1 tablet by mouth daily.  . [DISCONTINUED] buPROPion (WELLBUTRIN XL) 300 MG 24 hr tablet TAKE 1 TABLET BY MOUTH EVERY DAY  . [DISCONTINUED] omeprazole (PRILOSEC) 40 MG capsule Take 1 capsule (40 mg total) by mouth in the morning and at bedtime.  . CHLOROPHYLL PO Take 1 tablet by mouth daily. (Patient not taking: No sig reported)  . [DISCONTINUED] benzonatate (TESSALON) 100 MG capsule Take 2 capsules (200 mg total) by mouth every 8 (eight) hours. (Patient not taking: No sig reported)  . [DISCONTINUED] Vitamin D, Ergocalciferol, (DRISDOL) 1.25 MG (50000 UNIT) CAPS capsule Take 1 capsule (50,000 Units total) by mouth every 7 (seven) days. (taking one tablet per week) walk in lab in office 1-2 weeks after completing prescription. (Patient not taking: No sig reported)   No  facility-administered medications prior to visit.    Review of Systems  Constitutional: Negative.   HENT: Negative.   Respiratory: Negative.   Cardiovascular: Negative.   Gastrointestinal: Positive for abdominal distention and nausea. Negative for abdominal pain, anal bleeding, blood in stool, constipation,  diarrhea, rectal pain and vomiting.  Genitourinary: Negative.   Musculoskeletal: Negative.   Skin: Negative.   Neurological: Negative.   Hematological: Negative.   Psychiatric/Behavioral: The patient is nervous/anxious.   All other systems reviewed and are negative.      Objective    BP (!) 136/93   Pulse 75   Temp 98.9 F (37.2 C) (Oral)   Resp 16   Wt 220 lb 12.8 oz (100.2 kg)   BMI 39.11 kg/m  BP Readings from Last 3 Encounters:  06/03/20 (!) 136/93  05/15/20 (!) 144/98  05/06/20 (!) 124/96   Wt Readings from Last 3 Encounters:  06/03/20 220 lb 12.8 oz (100.2 kg)  05/15/20 220 lb (99.8 kg)  05/06/20 218 lb (98.9 kg)       Physical Exam Vitals and nursing note reviewed.  Constitutional:      General: She is not in acute distress.    Appearance: Normal appearance. She is obese. She is not ill-appearing, toxic-appearing or diaphoretic.  HENT:     Head: Normocephalic and atraumatic.     Right Ear: Tympanic membrane, ear canal and external ear normal. There is no impacted cerumen.     Left Ear: Tympanic membrane, ear canal and external ear normal. There is no impacted cerumen.     Nose: Nose normal.     Mouth/Throat:     Mouth: Mucous membranes are moist.     Pharynx: Oropharynx is clear. No oropharyngeal exudate or posterior oropharyngeal erythema.  Eyes:     General: No scleral icterus.       Right eye: No discharge.        Left eye: No discharge.     Conjunctiva/sclera: Conjunctivae normal.     Pupils: Pupils are equal, round, and reactive to light.  Cardiovascular:     Rate and Rhythm: Normal rate and regular rhythm.     Pulses: Normal pulses.     Heart sounds: Normal heart sounds. No murmur heard. No friction rub. No gallop.   Pulmonary:     Effort: Pulmonary effort is normal. No respiratory distress.     Breath sounds: Normal breath sounds. No stridor. No wheezing, rhonchi or rales.  Chest:     Chest wall: No tenderness.  Abdominal:     General:  Bowel sounds are normal. There is no distension.     Palpations: Abdomen is soft.     Tenderness: There is no abdominal tenderness. There is no guarding.  Genitourinary:    Comments: Deferred.  Musculoskeletal:        General: No tenderness. Normal range of motion.     Cervical back: Normal range of motion and neck supple.     Right lower leg: No edema.     Left lower leg: No edema.  Skin:    General: Skin is warm.     Findings: No erythema, lesion or rash.  Neurological:     Mental Status: She is alert and oriented to person, place, and time.     Motor: No weakness.     Gait: Gait normal.  Psychiatric:        Mood and Affect: Mood normal.        Behavior: Behavior normal.  Thought Content: Thought content normal.        Judgment: Judgment normal.      Results for orders placed or performed in visit on 06/03/20  TSH  Result Value Ref Range   TSH 1.080 0.450 - 4.500 uIU/mL  Lipid Panel w/o Chol/HDL Ratio  Result Value Ref Range   Cholesterol, Total 186 100 - 199 mg/dL   Triglycerides 102 0 - 149 mg/dL   HDL 62 >39 mg/dL   VLDL Cholesterol Cal 18 5 - 40 mg/dL   LDL Chol Calc (NIH) 106 (H) 0 - 99 mg/dL  CBC with Differential/Platelet  Result Value Ref Range   WBC 5.6 3.4 - 10.8 x10E3/uL   RBC 4.56 3.77 - 5.28 x10E6/uL   Hemoglobin 12.8 11.1 - 15.9 g/dL   Hematocrit 40.4 34.0 - 46.6 %   MCV 89 79 - 97 fL   MCH 28.1 26.6 - 33.0 pg   MCHC 31.7 31.5 - 35.7 g/dL   RDW 13.9 11.7 - 15.4 %   Platelets 290 150 - 450 x10E3/uL   Neutrophils 54 Not Estab. %   Lymphs 35 Not Estab. %   Monocytes 9 Not Estab. %   Eos 1 Not Estab. %   Basos 1 Not Estab. %   Neutrophils Absolute 3.0 1.4 - 7.0 x10E3/uL   Lymphocytes Absolute 1.9 0.7 - 3.1 x10E3/uL   Monocytes Absolute 0.5 0.1 - 0.9 x10E3/uL   EOS (ABSOLUTE) 0.1 0.0 - 0.4 x10E3/uL   Basophils Absolute 0.0 0.0 - 0.2 x10E3/uL   Immature Granulocytes 0 Not Estab. %   Immature Grans (Abs) 0.0 0.0 - 0.1 x10E3/uL  Comprehensive  metabolic panel  Result Value Ref Range   Glucose 93 65 - 99 mg/dL   BUN 15 6 - 20 mg/dL   Creatinine, Ser 1.20 (H) 0.57 - 1.00 mg/dL   eGFR 59 (L) >59 mL/min/1.73   BUN/Creatinine Ratio 13 9 - 23   Sodium 140 134 - 144 mmol/L   Potassium 4.4 3.5 - 5.2 mmol/L   Chloride 105 96 - 106 mmol/L   CO2 22 20 - 29 mmol/L   Calcium 9.7 8.7 - 10.2 mg/dL   Total Protein 7.0 6.0 - 8.5 g/dL   Albumin 4.4 3.8 - 4.8 g/dL   Globulin, Total 2.6 1.5 - 4.5 g/dL   Albumin/Globulin Ratio 1.7 1.2 - 2.2   Bilirubin Total 0.4 0.0 - 1.2 mg/dL   Alkaline Phosphatase 83 44 - 121 IU/L   AST 20 0 - 40 IU/L   ALT 18 0 - 32 IU/L  VITAMIN D 25 Hydroxy (Vit-D Deficiency, Fractures)  Result Value Ref Range   Vit D, 25-Hydroxy 44.6 30.0 - 100.0 ng/mL    Assessment & Plan     1. Vitamin D insufficiency - VITAMIN D 25 Hydroxy (Vit-D Deficiency, Fractures)  2. Fatigue, unspecified type - TSH - Lipid Panel w/o Chol/HDL Ratio - CBC with Differential/Platelet - Comprehensive metabolic panel  3. Gastric outlet obstruction - CBC with Differential/Platelet - Comprehensive metabolic panel - VITAMIN D 25 Hydroxy (Vit-D Deficiency, Fractures)  4. Body mass index (BMI) of 39.0-39.9 in adult  5. Gastroesophageal reflux disease without esophagitis - omeprazole (PRILOSEC) 40 MG capsule; Take 1 capsule (40 mg total) by mouth in the morning and at bedtime.  Dispense: 180 capsule; Refill: 0  6. Class 3 severe obesity due to excess calories without serious comorbidity with body mass index (BMI) of 40.0 to 44.9 in adult ([place])  7. COVID-19 vaccination declined  The entirety of the information documented in the History of Present Illness, Review of Systems and Physical Exam were personally obtained by me. Portions of this information were initially documented by the CMA and reviewed by me for thoroughness and accuracy.   Return in about 1 month (around 07/04/2020), or if symptoms worsen or fail to improve, for at any  time for any worsening symptoms, Go to Emergency room/ urgent care if worse.        Marcille Buffy, [place] [phone] (phone) [phone] (fax)  [place]

## 2020-06-04 ENCOUNTER — Telehealth: Payer: Self-pay | Admitting: Surgery

## 2020-06-04 ENCOUNTER — Inpatient Hospital Stay: Admission: RE | Admit: 2020-06-04 | Payer: BC Managed Care – PPO | Source: Ambulatory Visit

## 2020-06-04 LAB — CBC WITH DIFFERENTIAL/PLATELET
Basophils Absolute: 0 10*3/uL (ref 0.0–0.2)
Basos: 1 %
EOS (ABSOLUTE): 0.1 10*3/uL (ref 0.0–0.4)
Eos: 1 %
Hematocrit: 40.4 % (ref 34.0–46.6)
Hemoglobin: 12.8 g/dL (ref 11.1–15.9)
Immature Grans (Abs): 0 10*3/uL (ref 0.0–0.1)
Immature Granulocytes: 0 %
Lymphocytes Absolute: 1.9 10*3/uL (ref 0.7–3.1)
Lymphs: 35 %
MCH: 28.1 pg (ref 26.6–33.0)
MCHC: 31.7 g/dL (ref 31.5–35.7)
MCV: 89 fL (ref 79–97)
Monocytes Absolute: 0.5 10*3/uL (ref 0.1–0.9)
Monocytes: 9 %
Neutrophils Absolute: 3 10*3/uL (ref 1.4–7.0)
Neutrophils: 54 %
Platelets: 290 10*3/uL (ref 150–450)
RBC: 4.56 x10E6/uL (ref 3.77–5.28)
RDW: 13.9 % (ref 11.7–15.4)
WBC: 5.6 10*3/uL (ref 3.4–10.8)

## 2020-06-04 LAB — COMPREHENSIVE METABOLIC PANEL
ALT: 18 IU/L (ref 0–32)
AST: 20 IU/L (ref 0–40)
Albumin/Globulin Ratio: 1.7 (ref 1.2–2.2)
Albumin: 4.4 g/dL (ref 3.8–4.8)
Alkaline Phosphatase: 83 IU/L (ref 44–121)
BUN/Creatinine Ratio: 13 (ref 9–23)
BUN: 15 mg/dL (ref 6–20)
Bilirubin Total: 0.4 mg/dL (ref 0.0–1.2)
CO2: 22 mmol/L (ref 20–29)
Calcium: 9.7 mg/dL (ref 8.7–10.2)
Chloride: 105 mmol/L (ref 96–106)
Creatinine, Ser: 1.2 mg/dL — ABNORMAL HIGH (ref 0.57–1.00)
Globulin, Total: 2.6 g/dL (ref 1.5–4.5)
Glucose: 93 mg/dL (ref 65–99)
Potassium: 4.4 mmol/L (ref 3.5–5.2)
Sodium: 140 mmol/L (ref 134–144)
Total Protein: 7 g/dL (ref 6.0–8.5)
eGFR: 59 mL/min/{1.73_m2} — ABNORMAL LOW (ref >59)

## 2020-06-04 LAB — LIPID PANEL W/O CHOL/HDL RATIO
Cholesterol, Total: 186 mg/dL (ref 100–199)
HDL: 62 mg/dL (ref >39)
LDL Chol Calc (NIH): 106 mg/dL — ABNORMAL HIGH (ref 0–99)
Triglycerides: 102 mg/dL (ref 0–149)
VLDL Cholesterol Cal: 18 mg/dL (ref 5–40)

## 2020-06-04 LAB — VITAMIN D 25 HYDROXY (VIT D DEFICIENCY, FRACTURES): Vit D, 25-Hydroxy: 44.6 ng/mL (ref 30.0–100.0)

## 2020-06-04 LAB — TSH: TSH: 1.08 u[IU]/mL (ref 0.450–4.500)

## 2020-06-04 NOTE — Telephone Encounter (Signed)
Updated information regarding rescheduled surgery.  Patient has been advised of Pre-Admission date/time, COVID Testing date and Surgery date.  Surgery Date: 07/02/20 Preadmission Testing Date: 05/28/20 (phone done) Covid Testing Date: 06/28/20 in person at 9:10 am - patient advised to [facility] to [facility] ([address] [facility])   Patient has been made aware to call [phone], between 1-3:00pm the day before surgery, to find out what time to arrive for surgery.

## 2020-06-05 ENCOUNTER — Other Ambulatory Visit: Payer: Self-pay | Admitting: Adult Health

## 2020-06-05 ENCOUNTER — Encounter: Payer: Self-pay | Admitting: Adult Health

## 2020-06-05 DIAGNOSIS — E559 Vitamin D deficiency, unspecified: Secondary | ICD-10-CM

## 2020-06-05 DIAGNOSIS — Z2821 Immunization not carried out because of patient refusal: Secondary | ICD-10-CM | POA: Insufficient documentation

## 2020-06-05 MED ORDER — VITAMIN D (ERGOCALCIFEROL) 1.25 MG (50000 UNIT) PO CAPS: 50000.0000 [IU] | ORAL_CAPSULE | ORAL | 0 refills | Status: DC

## 2020-06-05 NOTE — Telephone Encounter (Signed)
She can do one more round of 12 week vitamin D course and recheck and then go to Over the counter as directed.

## 2020-06-05 NOTE — Telephone Encounter (Signed)
Patient had labs drawn on 3/14 Vitamin D was within normal range I was going to deny request again unless you think she needs to continue. KW

## 2020-06-06 ENCOUNTER — Telehealth: Payer: Self-pay

## 2020-06-06 NOTE — Telephone Encounter (Signed)
-----   Message from Berniece Pap, FNP sent at 06/06/2020  4:46 PM EDT ----- TSH within normal limits. LDL elevated.  Discuss lifestyle modification with patient e.g. increase exercise, fiber, fruits, vegetables, lean meat, and omega 3/fish intake and decrease saturated fat.  If patient following strict diet and exercise program already please schedule follow up appointment with primary care physician  CBC within normal limits.  CMP shows slight decrease in kidney function likely dehydration avoid excessive NDAID's such as aleve, ibuprofen, Advil, motrin etc.  advise recheck CMP add lab in 3 months.  Vitamin D is 44.6  within normal range. Complete prescription vitamin D and then can start Vitamin D at 4,000 international units once daily.   CMP and vitamin D recheck at lab in 3- 4 months, hydrate with water before labs.

## 2020-06-06 NOTE — Telephone Encounter (Signed)
Pt advised FMLA forms were faxed to [facility] this morning. TNP

## 2020-06-06 NOTE — Progress Notes (Signed)
TSH within normal limits. LDL elevated.  Discuss lifestyle modification with patient e.g. increase exercise, fiber, fruits, vegetables, lean meat, and omega 3/fish intake and decrease saturated fat.  If patient following strict diet and exercise program already please schedule follow up appointment with primary care physician  CBC within normal limits.  CMP shows slight decrease in kidney function likely dehydration avoid excessive NDAID's such as aleve, ibuprofen, Advil, motrin etc.  advise recheck CMP add lab in 3 months.  Vitamin D is 44.6  within normal range. Complete prescription vitamin D and then can start Vitamin D at 4,000 international units once daily.   CMP and vitamin D recheck at lab in 3- 4 months, hydrate with water before labs.

## 2020-06-06 NOTE — Telephone Encounter (Signed)
Copied from CRM [id number]. Topic: General - Other >> Jun 05, 2020  2:14 PM Jo Soto wrote: Reason for CRM: Pt called and is requesting to know if a fax has been received for her from [facility]. Please advise.

## 2020-06-06 NOTE — Telephone Encounter (Signed)
I called pt and pt did not answer. VM left for pt to return call. PEC may release results. 

## 2020-06-12 ENCOUNTER — Ambulatory Visit: Payer: BC Managed Care – PPO | Admitting: Gastroenterology

## 2020-06-25 ENCOUNTER — Telehealth: Payer: Self-pay | Admitting: *Deleted

## 2020-06-25 NOTE — Telephone Encounter (Signed)
Faxed FMLA to [facility] at [phone]

## 2020-06-28 ENCOUNTER — Other Ambulatory Visit: Payer: Self-pay

## 2020-06-28 ENCOUNTER — Other Ambulatory Visit
Admission: RE | Admit: 2020-06-28 | Discharge: 2020-06-28 | Disposition: A | Discharge status: Home / Self Care | Payer: BC Managed Care – PPO | Source: Ambulatory Visit | Attending: Surgery | Admitting: Surgery

## 2020-06-28 DIAGNOSIS — Z20822 Contact with and (suspected) exposure to covid-19: Secondary | ICD-10-CM | POA: Diagnosis not present

## 2020-06-28 DIAGNOSIS — Z01812 Encounter for preprocedural laboratory examination: Secondary | ICD-10-CM | POA: Diagnosis not present

## 2020-06-28 LAB — SARS CORONAVIRUS 2 (TAT 6-24 HRS): SARS Coronavirus 2: NEGATIVE

## 2020-07-01 NOTE — Telephone Encounter (Addendum)
Relation to pt: self  Call back number: [phone]   Reason for call:  Patient checking on the status of FMLA paperwork and states [place] denies receiving paperwork. Patient would like forms re faxed and a follow up call when completed.

## 2020-07-02 ENCOUNTER — Encounter: Payer: Self-pay | Admitting: Surgery

## 2020-07-02 ENCOUNTER — Ambulatory Visit: Payer: BC Managed Care – PPO | Admitting: Anesthesiology

## 2020-07-02 ENCOUNTER — Other Ambulatory Visit: Payer: Self-pay

## 2020-07-02 ENCOUNTER — Encounter
Admission: RE | Disposition: A | Discharge status: Home / Self Care | Payer: Self-pay | Source: Home / Self Care | Attending: Surgery

## 2020-07-02 ENCOUNTER — Ambulatory Visit
Admission: RE | Admit: 2020-07-02 | Discharge: 2020-07-02 | Disposition: A | Discharge status: Home / Self Care | Payer: BC Managed Care – PPO | Attending: Surgery | Admitting: Surgery

## 2020-07-02 DIAGNOSIS — Z8249 Family history of ischemic heart disease and other diseases of the circulatory system: Secondary | ICD-10-CM | POA: Diagnosis not present

## 2020-07-02 DIAGNOSIS — Z8616 Personal history of COVID-19: Secondary | ICD-10-CM | POA: Diagnosis not present

## 2020-07-02 DIAGNOSIS — K811 Chronic cholecystitis: Secondary | ICD-10-CM | POA: Insufficient documentation

## 2020-07-02 DIAGNOSIS — Z809 Family history of malignant neoplasm, unspecified: Secondary | ICD-10-CM | POA: Insufficient documentation

## 2020-07-02 DIAGNOSIS — F17200 Nicotine dependence, unspecified, uncomplicated: Secondary | ICD-10-CM | POA: Diagnosis not present

## 2020-07-02 DIAGNOSIS — K828 Other specified diseases of gallbladder: Secondary | ICD-10-CM | POA: Diagnosis not present

## 2020-07-02 DIAGNOSIS — Z833 Family history of diabetes mellitus: Secondary | ICD-10-CM | POA: Diagnosis not present

## 2020-07-02 DIAGNOSIS — Z793 Long term (current) use of hormonal contraceptives: Secondary | ICD-10-CM | POA: Insufficient documentation

## 2020-07-02 DIAGNOSIS — Z79899 Other long term (current) drug therapy: Secondary | ICD-10-CM | POA: Insufficient documentation

## 2020-07-02 HISTORY — PX: ROBOTIC ASSISTED LAPAROSCOPIC CHOLECYSTECTOMY: SHX6521

## 2020-07-02 LAB — POCT PREGNANCY, URINE: Preg Test, Ur: NEGATIVE

## 2020-07-02 SURGERY — CHOLECYSTECTOMY, ROBOT-ASSISTED, LAPAROSCOPIC
Anesthesia: General

## 2020-07-02 MED ORDER — GABAPENTIN 300 MG PO CAPS: 300.0000 mg | ORAL_CAPSULE | ORAL | Status: AC

## 2020-07-02 MED ORDER — GABAPENTIN 300 MG PO CAPS
ORAL_CAPSULE | ORAL | Status: AC
  Administered 2020-07-02: 300 mg via ORAL
  Filled 2020-07-02: qty 1

## 2020-07-02 MED ORDER — DEXAMETHASONE SODIUM PHOSPHATE 10 MG/ML IJ SOLN
INTRAMUSCULAR | Status: DC | PRN
  Administered 2020-07-02: 10 mg via INTRAVENOUS

## 2020-07-02 MED ORDER — IBUPROFEN 600 MG PO TABS
600.0000 mg | ORAL_TABLET | Freq: Three times a day (TID) | ORAL | 1 refills | Status: DC | PRN
Start: 2020-07-02 — End: 2020-07-08

## 2020-07-02 MED ORDER — DEXAMETHASONE SODIUM PHOSPHATE 10 MG/ML IJ SOLN
INTRAMUSCULAR | Status: AC
  Filled 2020-07-02: qty 1

## 2020-07-02 MED ORDER — ACETAMINOPHEN 500 MG PO TABS: 1000.0000 mg | ORAL_TABLET | Freq: Four times a day (QID) | ORAL | Status: DC | PRN

## 2020-07-02 MED ORDER — LIDOCAINE HCL (CARDIAC) PF 100 MG/5ML IV SOSY
PREFILLED_SYRINGE | INTRAVENOUS | Status: DC | PRN
  Administered 2020-07-02: 100 mg via INTRAVENOUS

## 2020-07-02 MED ORDER — ACETAMINOPHEN 500 MG PO TABS
ORAL_TABLET | ORAL | Status: AC
  Administered 2020-07-02: 1000 mg via ORAL
  Filled 2020-07-02: qty 2

## 2020-07-02 MED ORDER — PROPOFOL 10 MG/ML IV BOLUS
INTRAVENOUS | Status: AC
  Filled 2020-07-02: qty 20

## 2020-07-02 MED ORDER — LIDOCAINE HCL (PF) 2 % IJ SOLN
INTRAMUSCULAR | Status: AC
  Filled 2020-07-02: qty 5

## 2020-07-02 MED ORDER — ONDANSETRON HCL 4 MG/2ML IJ SOLN
INTRAMUSCULAR | Status: AC
  Filled 2020-07-02: qty 2

## 2020-07-02 MED ORDER — INDOCYANINE GREEN 25 MG IV SOLR
2.5000 mg | INTRAVENOUS | Status: AC
  Administered 2020-07-02: 2.5 mg via INTRAVENOUS
  Filled 2020-07-02: qty 1

## 2020-07-02 MED ORDER — ONDANSETRON HCL 4 MG/2ML IJ SOLN
INTRAMUSCULAR | Status: DC | PRN
  Administered 2020-07-02: 4 mg via INTRAVENOUS

## 2020-07-02 MED ORDER — SUGAMMADEX SODIUM 200 MG/2ML IV SOLN
INTRAVENOUS | Status: DC | PRN
  Administered 2020-07-02: 200 mg via INTRAVENOUS

## 2020-07-02 MED ORDER — CHLORHEXIDINE GLUCONATE 0.12 % MT SOLN: 15.0000 mL | Freq: Once | OROMUCOSAL | Status: AC

## 2020-07-02 MED ORDER — FENTANYL CITRATE (PF) 100 MCG/2ML IJ SOLN
INTRAMUSCULAR | Status: AC
  Filled 2020-07-02: qty 2

## 2020-07-02 MED ORDER — ROCURONIUM BROMIDE 100 MG/10ML IV SOLN
INTRAVENOUS | Status: DC | PRN
  Administered 2020-07-02 (×2): 20 mg via INTRAVENOUS
  Administered 2020-07-02: 50 mg via INTRAVENOUS

## 2020-07-02 MED ORDER — BUPIVACAINE-EPINEPHRINE (PF) 0.25% -1:200000 IJ SOLN
INTRAMUSCULAR | Status: DC | PRN
  Administered 2020-07-02: 30 mL

## 2020-07-02 MED ORDER — OXYCODONE HCL 5 MG PO TABS
5.0000 mg | ORAL_TABLET | Freq: Once | ORAL | Status: AC | PRN
  Administered 2020-07-02: 5 mg via ORAL

## 2020-07-02 MED ORDER — MIDAZOLAM HCL 2 MG/2ML IJ SOLN
INTRAMUSCULAR | Status: AC
  Filled 2020-07-02: qty 2

## 2020-07-02 MED ORDER — ORAL CARE MOUTH RINSE: 15.0000 mL | Freq: Once | OROMUCOSAL | Status: AC

## 2020-07-02 MED ORDER — PROPOFOL 10 MG/ML IV BOLUS
INTRAVENOUS | Status: DC | PRN
  Administered 2020-07-02: 30 mg via INTRAVENOUS
  Administered 2020-07-02: 160 mg via INTRAVENOUS

## 2020-07-02 MED ORDER — CHLORHEXIDINE GLUCONATE 0.12 % MT SOLN
OROMUCOSAL | Status: AC
  Administered 2020-07-02: 15 mL via OROMUCOSAL
  Filled 2020-07-02: qty 15

## 2020-07-02 MED ORDER — OXYCODONE HCL 5 MG/5ML PO SOLN: 5.0000 mg | Freq: Once | ORAL | Status: AC | PRN

## 2020-07-02 MED ORDER — ONDANSETRON HCL 4 MG/2ML IJ SOLN
4.0000 mg | Freq: Once | INTRAMUSCULAR | Status: AC | PRN
  Administered 2020-07-02: 4 mg via INTRAVENOUS

## 2020-07-02 MED ORDER — ROCURONIUM BROMIDE 10 MG/ML (PF) SYRINGE
PREFILLED_SYRINGE | INTRAVENOUS | Status: AC
  Filled 2020-07-02: qty 10

## 2020-07-02 MED ORDER — CHLORHEXIDINE GLUCONATE CLOTH 2 % EX PADS: 6.0000 | MEDICATED_PAD | Freq: Once | CUTANEOUS | Status: DC

## 2020-07-02 MED ORDER — FENTANYL CITRATE (PF) 100 MCG/2ML IJ SOLN: 25.0000 ug | INTRAMUSCULAR | Status: DC | PRN

## 2020-07-02 MED ORDER — PHENYLEPHRINE HCL (PRESSORS) 10 MG/ML IV SOLN
INTRAVENOUS | Status: DC | PRN
  Administered 2020-07-02: 100 ug via INTRAVENOUS
  Administered 2020-07-02: 200 ug via INTRAVENOUS
  Administered 2020-07-02: 100 ug via INTRAVENOUS

## 2020-07-02 MED ORDER — OXYCODONE HCL 5 MG PO TABS: 5.0000 mg | ORAL_TABLET | ORAL | 0 refills | Status: DC | PRN

## 2020-07-02 MED ORDER — BUPIVACAINE-EPINEPHRINE (PF) 0.25% -1:200000 IJ SOLN
INTRAMUSCULAR | Status: AC
  Filled 2020-07-02: qty 30

## 2020-07-02 MED ORDER — MIDAZOLAM HCL 2 MG/2ML IJ SOLN
INTRAMUSCULAR | Status: DC | PRN
  Administered 2020-07-02 (×2): 1 mg via INTRAVENOUS

## 2020-07-02 MED ORDER — CEFAZOLIN SODIUM-DEXTROSE 2-4 GM/100ML-% IV SOLN
INTRAVENOUS | Status: AC
  Filled 2020-07-02: qty 100

## 2020-07-02 MED ORDER — OXYCODONE HCL 5 MG PO TABS
ORAL_TABLET | ORAL | Status: AC
  Filled 2020-07-02: qty 1

## 2020-07-02 MED ORDER — LACTATED RINGERS IV SOLN: INTRAVENOUS | Status: DC | PRN

## 2020-07-02 MED ORDER — LACTATED RINGERS IV SOLN: INTRAVENOUS | Status: DC

## 2020-07-02 MED ORDER — ACETAMINOPHEN 500 MG PO TABS: 1000.0000 mg | ORAL_TABLET | ORAL | Status: AC

## 2020-07-02 MED ORDER — FENTANYL CITRATE (PF) 100 MCG/2ML IJ SOLN
INTRAMUSCULAR | Status: DC | PRN
  Administered 2020-07-02 (×2): 50 ug via INTRAVENOUS

## 2020-07-02 MED ORDER — CEFAZOLIN SODIUM-DEXTROSE 2-4 GM/100ML-% IV SOLN: 2.0000 g | INTRAVENOUS | Status: DC

## 2020-07-02 SURGICAL SUPPLY — 56 items
ADH SKN CLS APL DERMABOND .7 (GAUZE/BANDAGES/DRESSINGS) ×1
APL PRP STRL LF DISP 70% ISPRP (MISCELLANEOUS) ×1
BAG INFUSER PRESSURE 100CC (MISCELLANEOUS) IMPLANT
BAG SPEC RTRVL LRG 6X4 10 (ENDOMECHANICALS) ×1
CANISTER SUCT 1200ML W/VALVE (MISCELLANEOUS) IMPLANT
CANNULA REDUC XI 12-8 STAPL (CANNULA) ×1
CANNULA REDUCER 12-8 DVNC XI (CANNULA) ×1 IMPLANT
CHLORAPREP W/TINT 26 (MISCELLANEOUS) ×2 IMPLANT
CLIP VESOLOCK MED LG 6/CT (CLIP) ×2 IMPLANT
COVER WAND RF STERILE (DRAPES) ×2 IMPLANT
CUP MEDICINE 2OZ PLAST GRAD ST (MISCELLANEOUS) ×2 IMPLANT
DECANTER SPIKE VIAL GLASS SM (MISCELLANEOUS) ×2 IMPLANT
DEFOGGER SCOPE WARMER CLEARIFY (MISCELLANEOUS) ×2 IMPLANT
DERMABOND ADVANCED (GAUZE/BANDAGES/DRESSINGS) ×1
DERMABOND ADVANCED .7 DNX12 (GAUZE/BANDAGES/DRESSINGS) ×1 IMPLANT
DRAPE ARM DVNC X/XI (DISPOSABLE) ×4 IMPLANT
DRAPE COLUMN DVNC XI (DISPOSABLE) ×1 IMPLANT
DRAPE DA VINCI XI ARM (DISPOSABLE) ×4
DRAPE DA VINCI XI COLUMN (DISPOSABLE) ×1
ELECT CAUTERY BLADE TIP 2.5 (TIP) ×2
ELECT REM PT RETURN 9FT ADLT (ELECTROSURGICAL) ×2
ELECTRODE CAUTERY BLDE TIP 2.5 (TIP) ×1 IMPLANT
ELECTRODE REM PT RTRN 9FT ADLT (ELECTROSURGICAL) ×1 IMPLANT
GLOVE SURG SYN 7.0 (GLOVE) ×4 IMPLANT
GLOVE SURG SYN 7.5  E (GLOVE) ×2
GLOVE SURG SYN 7.5 E (GLOVE) ×2 IMPLANT
GOWN STRL REUS W/ TWL LRG LVL3 (GOWN DISPOSABLE) ×4 IMPLANT
GOWN STRL REUS W/TWL LRG LVL3 (GOWN DISPOSABLE) ×8
IRRIGATOR SUCT 8 DISP DVNC XI (IRRIGATION / IRRIGATOR) IMPLANT
IRRIGATOR SUCTION 8MM XI DISP (IRRIGATION / IRRIGATOR)
IV NS 1000ML (IV SOLUTION)
IV NS 1000ML BAXH (IV SOLUTION) IMPLANT
KIT PINK PAD W/HEAD ARE REST (MISCELLANEOUS) ×2
KIT PINK PAD W/HEAD ARM REST (MISCELLANEOUS) ×1 IMPLANT
LABEL OR SOLS (LABEL) ×2 IMPLANT
MANIFOLD NEPTUNE II (INSTRUMENTS) ×2 IMPLANT
NEEDLE HYPO 22GX1.5 SAFETY (NEEDLE) ×2 IMPLANT
NS IRRIG 500ML POUR BTL (IV SOLUTION) ×2 IMPLANT
OBTURATOR OPTICAL STANDARD 8MM (TROCAR) ×1
OBTURATOR OPTICAL STND 8 DVNC (TROCAR) ×1
OBTURATOR OPTICALSTD 8 DVNC (TROCAR) ×1 IMPLANT
PACK LAP CHOLECYSTECTOMY (MISCELLANEOUS) ×2 IMPLANT
PENCIL ELECTRO HAND CTR (MISCELLANEOUS) ×2 IMPLANT
POUCH SPECIMEN RETRIEVAL 10MM (ENDOMECHANICALS) ×2 IMPLANT
SEAL CANN UNIV 5-8 DVNC XI (MISCELLANEOUS) ×4 IMPLANT
SEAL XI 5MM-8MM UNIVERSAL (MISCELLANEOUS) ×4
SET TUBE SMOKE EVAC HIGH FLOW (TUBING) ×2 IMPLANT
SOLUTION ELECTROLUBE (MISCELLANEOUS) ×2 IMPLANT
SPONGE LAP 18X18 RF (DISPOSABLE) IMPLANT
SPONGE LAP 4X18 RFD (DISPOSABLE) ×2 IMPLANT
STAPLER CANNULA SEAL DVNC XI (STAPLE) ×1 IMPLANT
STAPLER CANNULA SEAL XI (STAPLE) ×1
SUT MNCRL AB 4-0 PS2 18 (SUTURE) ×2 IMPLANT
SUT VICRYL 0 AB UR-6 (SUTURE) ×4 IMPLANT
TAPE TRANSPORE STRL 2 31045 (GAUZE/BANDAGES/DRESSINGS) ×2 IMPLANT
TROCAR BALLN GELPORT 12X130M (ENDOMECHANICALS) ×2 IMPLANT

## 2020-07-02 NOTE — Discharge Instructions (Signed)

## 2020-07-02 NOTE — Anesthesia Preprocedure Evaluation (Signed)
Anesthesia Evaluation  Patient identified by MRN, date of birth, ID band Patient awake    Reviewed: Allergy & Precautions, NPO status , Patient's Chart, lab work & pertinent test results  History of Anesthesia Complications Negative for: history of anesthetic complications  Airway Mallampati: III  TM Distance: >3 FB Neck ROM: Full    Dental  (+) Teeth Intact   Pulmonary sleep apnea , neg COPD, Current Smoker and Patient abstained from smoking.,  Childhood asthma. Smoker, abstained for 48 hours OSA not on CPAP yet   Pulmonary exam normal breath sounds clear to auscultation       Cardiovascular Exercise Tolerance: Good METS(-) hypertension(-) CAD and (-) Past MI negative cardio ROS  (-) dysrhythmias  Rhythm:Regular Rate:Normal - Systolic murmurs    Neuro/Psych  Headaches, PSYCHIATRIC DISORDERS Anxiety Depression    GI/Hepatic GERD  Medicated,(+)     substance abuse  alcohol use, 3-4 drinks 3-4 times per week   Endo/Other  neg diabetes  Renal/GU negative Renal ROS     Musculoskeletal   Abdominal (+) + obese,   Peds  Hematology   Anesthesia Other Findings Past Medical History: No date: Allergy No date: Anxiety No date: Depression No date: GERD (gastroesophageal reflux disease) No date: Heartburn No date: Migraines  Reproductive/Obstetrics                             Anesthesia Physical Anesthesia Plan  ASA: II  Anesthesia Plan: General   Post-op Pain Management:    Induction: Intravenous  PONV Risk Score and Plan: 3 and Ondansetron, Dexamethasone and Midazolam  Airway Management Planned: Oral ETT  Additional Equipment: None  Intra-op Plan:   Post-operative Plan: Extubation in OR  Informed Consent: I have reviewed the patients History and Physical, chart, labs and discussed the procedure including the risks, benefits and alternatives for the proposed anesthesia with  the patient or authorized representative who has indicated his/her understanding and acceptance.     Dental advisory given  Plan Discussed with: CRNA and Surgeon  Anesthesia Plan Comments: (Discussed risks of anesthesia with patient, including PONV, sore throat, lip/dental damage. Rare risks discussed as well, such as cardiorespiratory and neurological sequelae. Patient understands. Patient counseled on benefits of smoking cessation, and increased perioperative risks associated with continued smoking. )        Anesthesia Quick Evaluation

## 2020-07-02 NOTE — Telephone Encounter (Signed)
Can you check on forms documentation they were completed and faxed three weeks ago. Thanks.

## 2020-07-02 NOTE — Anesthesia Procedure Notes (Signed)
Procedure Name: Intubation Date/Time: 07/02/2020 7:50 AM Performed by: Henrietta Hoover, CRNA Pre-anesthesia Checklist: Patient identified, Patient being monitored, Timeout performed, Emergency Drugs available and Suction available Patient Re-evaluated:Patient Re-evaluated prior to induction Oxygen Delivery Method: Circle system utilized Preoxygenation: Pre-oxygenation with 100% oxygen Induction Type: IV induction Ventilation: Mask ventilation without difficulty Laryngoscope Size: 3 and McGraph Grade View: Grade I Tube type: Oral Tube size: 6.5 mm Number of attempts: 1 Airway Equipment and Method: Stylet Placement Confirmation: ETT inserted through vocal cords under direct vision,  positive ETCO2 and breath sounds checked- equal and bilateral Secured at: 21 cm Tube secured with: Tape Dental Injury: Teeth and Oropharynx as per pre-operative assessment

## 2020-07-02 NOTE — Telephone Encounter (Signed)
Can you see if you have this patients FMLA paperwork in your stack> KW

## 2020-07-02 NOTE — Op Note (Signed)
  Procedure Date:  07/02/2020  Pre-operative Diagnosis:  Biliary Dyskinesia  Post-operative Diagnosis:  Biliary Dyskinesia  Procedure:  Robotic assisted cholecystectomy with ICG FireFly cholangiogram  Surgeon:  Howie Ill, MD  Assistant:  Perlie Gold, PA-S  Anesthesia:  General endotracheal  Estimated Blood Loss:  5 ml  Specimens:  gallbladder  Complications:  None  Indications for Procedure:  This is a 40 y.o. female who presents with abdominal pain and workup revealing biliary dyskinesia.  The benefits, complications, treatment options, and expected outcomes were discussed with the patient. The risks of bleeding, infection, recurrence of symptoms, failure to resolve symptoms, bile duct damage, bile duct leak, retained common bile duct stone, bowel injury, and need for further procedures were all discussed with the patient and she was willing to proceed.  Description of Procedure: The patient was correctly identified in the preoperative area and brought into the operating room.  The patient was placed supine with VTE prophylaxis in place.  Appropriate time-outs were performed.  Anesthesia was induced and the patient was intubated.  Appropriate antibiotics were infused.  The abdomen was prepped and draped in a sterile fashion. An infraumbilical incision was made. A cutdown technique was used to enter the abdominal cavity without injury, and a 12 mm robotic port was inserted.  Pneumoperitoneum was obtained with appropriate opening pressures.  Three 8-mm ports were placed in the mid abdomen at the level of the umbilicus under direct visualization.  The DaVinci platform was docked, camera targeted, and instruments were placed under direct visualization.  The gallbladder was identified.  The fundus was grasped and retracted cephalad.  Adhesions were lysed bluntly and with electrocautery. The infundibulum was grasped and retracted laterally, exposing the peritoneum overlying the  gallbladder.  This was incised with electrocautery and extended on either side of the gallbladder.  FireFly cholangiogram was then obtained, and we were able to clearly identify the cystic duct and common bile duct.  The cystic duct and cystic artery were carefully dissected with combination of cautery and blunt dissection.  Both were clipped twice proximally and once distally, cutting in between.  The gallbladder was taken from the gallbladder fossa in a retrograde fashion with electrocautery. The gallbladder was placed in an Endocatch bag. The liver bed was inspected and any bleeding was controlled with electrocautery. The right upper quadrant was then inspected again revealing intact clips, no bleeding, and no ductal injury.  The 8 mm ports were removed under direct visualization and the 12 mm port was removed.  The Endocatch bag was brought out via the umbilical incision. The fascial opening was closed using 0 vicryl suture.  Local anesthetic was infused in all incisions and the incisions were closed with 4-0 Monocryl.  The wounds were cleaned and sealed with DermaBond.  The patient was emerged from anesthesia and extubated and brought to the recovery room for further management.  The patient tolerated the procedure well and all counts were correct at the end of the case.   Howie Ill, MD

## 2020-07-02 NOTE — Anesthesia Postprocedure Evaluation (Signed)
Anesthesia Post Note  Patient: Jo Soto  Procedure(s) Performed: XI ROBOTIC ASSISTED LAPAROSCOPIC CHOLECYSTECTOMY (N/A ) INDOCYANINE GREEN FLUORESCENCE IMAGING (ICG) (N/A )  Patient location during evaluation: PACU Anesthesia Type: General Level of consciousness: awake and alert Pain management: pain level controlled Vital Signs Assessment: post-procedure vital signs reviewed and stable Respiratory status: spontaneous breathing, nonlabored ventilation, respiratory function stable and patient connected to nasal cannula oxygen Cardiovascular status: blood pressure returned to baseline and stable Postop Assessment: no apparent nausea or vomiting Anesthetic complications: no   No complications documented.   Last Vitals:  Vitals:   07/02/20 1015 07/02/20 1042  BP: (!) 128/91 (!) 148/100  Pulse: 75 73  Resp: 14 16  Temp: 36.8 C 36.9 C  SpO2: 97% 99%    Last Pain:  Vitals:   07/02/20 1042  TempSrc: Temporal  PainSc: 5                  [name]

## 2020-07-02 NOTE — Transfer of Care (Signed)
Immediate Anesthesia Transfer of Care Note  Patient: Jo Soto  Procedure(s) Performed: XI ROBOTIC ASSISTED LAPAROSCOPIC CHOLECYSTECTOMY (N/A ) INDOCYANINE GREEN FLUORESCENCE IMAGING (ICG) (N/A )  Patient Location: PACU  Anesthesia Type:General  Level of Consciousness: awake, alert  and oriented  Airway & Oxygen Therapy: Patient Spontanous Breathing and Patient connected to face mask oxygen  Post-op Assessment: Report given to RN and Post -op Vital signs reviewed and stable  Post vital signs: Reviewed and stable  Last Vitals:  Vitals Value Taken Time  BP 138/94 07/02/20 0931  Temp    Pulse 83 07/02/20 0933  Resp 17 07/02/20 0933  SpO2 100 % 07/02/20 0933  Vitals shown include unvalidated device data.  Last Pain:  Vitals:   07/02/20 0642  TempSrc: Oral  PainSc: 0-No pain         Complications: No complications documented.

## 2020-07-02 NOTE — H&P (Signed)
07/02/20  History of Present Illness: Jo Soto is a 40 y.o. female presenting for evaluation of biliary dyskinesia.  The patient reports she's been dealing with abdominal pain since about July or August of 2021.  She has been seen by Dr. Tobi Bastos and she's been on Protonix and had a recent EGD.  EGD showed food in the stomach even after many hours of fasting, suggesting possible gastric motility issues.  As part of the workup, she also had a HIDA scan which showed patent biliary anatomy but an EF of 19%.    Patient reports that when she eats, she feels full early, feels epigastric and RUQ pain, nausea.  Sometimes she feels the food goes through her very quickly needing to have a bowel movement.  Denies any fevers, chills, chest pain, shortness of breath.    Past Medical History:     Past Medical History:  Diagnosis Date  . Allergy   . Anxiety   . Depression   . Heartburn   . Migraines      Past Surgical History:      Past Surgical History:  Procedure Laterality Date  . ANTERIOR CRUCIATE LIGAMENT REPAIR  12/2017  . ESOPHAGOGASTRODUODENOSCOPY (EGD) WITH PROPOFOL N/A 05/06/2020   Procedure: ESOPHAGOGASTRODUODENOSCOPY (EGD) WITH PROPOFOL;  Surgeon: Wyline Mood, MD;  Location: [facility] ENDOSCOPY;  Service: Gastroenterology;  Laterality: N/A;  COVID POSITIVE 04/03/2020  . TUMOR REMOVAL  12/2014   Abdominal    Home Medications:        Prior to Admission medications   Medication Sig Start Date End Date Taking? Authorizing Provider  benzonatate (TESSALON) 100 MG capsule Take 2 capsules (200 mg total) by mouth every 8 (eight) hours. 04/03/20  Yes Bast, Traci A, NP  buPROPion (WELLBUTRIN XL) 300 MG 24 hr tablet Take 1 tablet (300 mg total) by mouth daily. 03/05/20  Yes Flinchum, Eula Fried, FNP  CHLOROPHYLL PO Take 1 tablet by mouth daily.   Yes [provider]  dicyclomine (BENTYL) 10 MG capsule Take 1 capsule (10 mg total) by mouth 4 (four) times daily -  before meals  and at bedtime. 04/23/20  Yes Wyline Mood, MD  levonorgestrel [facility]) 20 MCG/24HR IUD 1 each by Intrauterine route once.   Yes [provider]  Multiple Vitamin (DAILY-VITAMIN PO) Take 1 tablet by mouth daily.   Yes [provider]  omeprazole (PRILOSEC) 40 MG capsule Take 1 capsule (40 mg total) by mouth in the morning and at bedtime. 04/23/20  Yes Wyline Mood, MD  Vitamin D, Ergocalciferol, (DRISDOL) 1.25 MG (50000 UNIT) CAPS capsule Take 1 capsule (50,000 Units total) by mouth every 7 (seven) days. (taking one tablet per week) walk in lab in office 1-2 weeks after completing prescription. 03/06/20  Yes Flinchum, Eula Fried, FNP    Allergies: No Known Allergies  Social History:  reports that she has been smoking. She has a 0.80 pack-year smoking history. She has never used smokeless tobacco. She reports current alcohol use of about 9.0 standard drinks of alcohol per week. She reports that she does not use drugs.   Family History:      Family History  Problem Relation Age of Onset  . Anemia Mother   . Cancer Mother   . Diabetes Mother   . Heart disease Sister   . Diabetes Maternal Aunt   . Diabetes Maternal Uncle     Review of Systems: Review of Systems  Constitutional: Negative for chills and fever.  HENT: Negative for hearing loss.  Respiratory: Negative for shortness of breath.   Cardiovascular: Negative for chest pain.  Gastrointestinal: Positive for abdominal pain, diarrhea and nausea. Negative for constipation and vomiting.  Genitourinary: Negative for dysuria.  Musculoskeletal: Negative for myalgias.  Skin: Negative for rash.  Neurological: Negative for dizziness.  Psychiatric/Behavioral: Negative for depression.    Physical Exam BP (!) 144/98   Pulse 80   Temp 98.1 F (36.7 C)   Ht 5\' 3"  (1.6 m)   Wt 220 lb (99.8 kg)   SpO2 98%   BMI 38.97 kg/m  CONSTITUTIONAL: No acute distress HEENT:  Normocephalic, atraumatic,  extraocular motion intact. NECK: Trachea is midline, and there is no jugular venous distension.  RESPIRATORY:  Lungs are clear, and breath sounds are equal bilaterally. Normal respiratory effort without pathologic use of accessory muscles. CARDIOVASCULAR: Heart is regular without murmurs, gallops, or rubs. GI: The abdomen is soft, obese, non-distended, with some discomfort/tenderness to palpation in the epigastric and RUQ areas.  Negative Murphy's sign. MUSCULOSKELETAL:  Normal muscle strength and tone in all four extremities.  No peripheral edema or cyanosis. SKIN: Skin turgor is normal. There are no pathologic skin lesions.  NEUROLOGIC:  Motor and sensation is grossly normal.  Cranial nerves are grossly intact. PSYCH:  Alert and oriented to person, place and time. Affect is normal.  Laboratory Analysis: Lab Results Last 24 Hours  No results found for this or any previous visit (from the past 24 hour(s)).    Imaging: HIDA scan 05/07/20: IMPRESSION: 1. Negative for acute gallbladder disease. 2. Decreased gallbladder ejection fraction of 19%. This can be seen with functional gallbladder disease in the appropriate clinical setting.  Assessment and Plan: This is a 40 y.o. female with abdominal pain, nausea, and diagnosis of biliary dyskinesia  --Discussed with the patient that she does have biliary dyskinesia with reduced EF during the study.  I think this is contributing to an extent to the symptoms that she's having. Although gastric dysmotility could also contribute to that, I do think she may have a combination of both issues. --Given that, I think she would be a good candidate for robotic approach.  Discussed with her the role for robotic cholecystectomy, including reviewing the risks of bleeding, infection, and injury to surrounding structures, as well as post-op recovery and activity restrictions. She's willing to proceed and wants to start feeling better. --Scheduled for surgery on  07/02/20  09/01/20, MD

## 2020-07-03 LAB — SURGICAL PATHOLOGY

## 2020-07-04 ENCOUNTER — Encounter: Payer: Self-pay | Admitting: Surgery

## 2020-07-08 ENCOUNTER — Ambulatory Visit (INDEPENDENT_AMBULATORY_CARE_PROVIDER_SITE_OTHER): Payer: BC Managed Care – PPO | Admitting: Surgery

## 2020-07-08 ENCOUNTER — Encounter: Payer: Self-pay | Admitting: Surgery

## 2020-07-08 ENCOUNTER — Other Ambulatory Visit: Payer: Self-pay | Admitting: Gastroenterology

## 2020-07-08 ENCOUNTER — Other Ambulatory Visit: Payer: Self-pay

## 2020-07-08 VITALS — BP 135/93 | HR 66 | Temp 99.3°F | Ht 63.0 in | Wt 217.8 lb

## 2020-07-08 DIAGNOSIS — Z09 Encounter for follow-up examination after completed treatment for conditions other than malignant neoplasm: Secondary | ICD-10-CM

## 2020-07-08 DIAGNOSIS — K828 Other specified diseases of gallbladder: Secondary | ICD-10-CM

## 2020-07-08 NOTE — Patient Instructions (Addendum)
You can try Miralax over the counter to help with constipation. What you are feeling is scar tissue from your surgery. Take ibuprofen  for the inflammation and pain and you can use Benadryl ointment to help with itching. If you have any concerns or questions, please feel free to call our office.   Gallbladder Eating Plan If you have a gallbladder condition, you may have trouble digesting fats. Eating a low-fat diet can help reduce your symptoms, and may be helpful before and after having surgery to remove your gallbladder (cholecystectomy). Your health care provider may recommend that you work with a diet and nutrition specialist (dietitian) to help you reduce the amount of fat in your diet. What are tips for following this plan? General guidelines  Limit your fat intake to less than 30% of your total daily calories. If you eat around 1,800 calories each day, this is less than 60 grams (g) of fat per day.  Fat is an important part of a healthy diet. Eating a low-fat diet can make it hard to maintain a healthy body weight. Ask your dietitian how much fat, calories, and other nutrients you need each day.  Eat small, frequent meals throughout the day instead of three large meals.  Drink at least 8-10 cups of fluid a day. Drink enough fluid to keep your urine clear or pale yellow.  Limit alcohol intake to no more than 1 drink a day for nonpregnant women and 2 drinks a day for men. One drink equals 12 oz of beer, 5 oz of wine, or 1 oz of hard liquor. Reading food labels  Check Nutrition Facts on food labels for the amount of fat per serving. Choose foods with less than 3 grams of fat per serving.   Shopping  Choose nonfat and low-fat healthy foods. Look for the words "nonfat," "low fat," or "fat free."  Avoid buying processed or prepackaged foods. Cooking  Cook using low-fat methods, such as baking, broiling, grilling, or boiling.  Cook with small amounts of healthy fats, such as olive oil,  grapeseed oil, canola oil, or sunflower oil. What foods are recommended?  All fresh, frozen, or canned fruits and vegetables.  Whole grains.  Low-fat or non-fat (skim) milk and yogurt.  Lean meat, skinless poultry, fish, eggs, and beans.  Low-fat protein supplement powders or drinks.  Spices and herbs. What foods are not recommended?  High-fat foods. These include baked goods, fast food, fatty cuts of meat, ice cream, french toast, sweet rolls, pizza, cheese bread, foods covered with butter, creamy sauces, or cheese.  Fried foods. These include french fries, tempura, battered fish, breaded chicken, fried breads, and sweets.  Foods with strong odors.  Foods that cause bloating and gas. Summary  A low-fat diet can be helpful if you have a gallbladder condition, or before and after gallbladder surgery.  Limit your fat intake to less than 30% of your total daily calories. This is about 60 g of fat if you eat 1,800 calories each day.  Eat small, frequent meals throughout the day instead of three large meals. This information is not intended to replace advice given to you by your health care provider. Make sure you discuss any questions you have with your health care provider. Document Revised: 10/26/2019 Document Reviewed: 10/26/2019 Elsevier Patient Education  2021 [employer].

## 2020-07-08 NOTE — Progress Notes (Signed)
07/08/2020  HPI: Jo Soto is a 40 y.o. female s/p robotic assisted cholecystectomy on 07/02/20 for biliary dyskinesia.  She reports she's constipated and has tried stool softeners.  Reports pain at the umbilical incision and an area of firmness.  She was worried about some redness that she had seen on it two days after surgery and the feeling of a bulge above the umbilicus, so she scheduled an appointment for today.  Vital signs: BP (!) 135/93   Pulse 66   Temp 99.3 F (37.4 C) (Oral)   Ht 5\' 3"  (1.6 m)   Wt 217 lb 12.8 oz (98.8 kg)   SpO2 97%   BMI 38.58 kg/m    Physical Exam: Constitutional:  No acute distress Abdomen:  Soft, non-distended, with appropriate tenderness to palpation.  Incisions are healing well, with some mild residual ecchymosis, but no erythema/induration or signs of infection.  No active drainage.  Above the umbilicus there is an area of firmness consistent with scar tissue/inflammatory changes, but there's no evidence of hernia.  Assessment/Plan: This is a 40 y.o. female s/p robotic cholecystectomy  --Discussed with the patient that the initial redness could have been bruising vs irritation after surgery, but now that has resolved.  There's no sign of infection and no antibiotics are needed.  Also discussed that the "bulge" that she's feeling is most likely scar tissue/post-op changes.  This will continue to improve with time. --Patient has a follow up with me on 4/26 as her original post-op check.  We'll keep this appointment to check on her umbilical incision. --May apply benadryl ointment for itchiness.   5/26, MD [facility]

## 2020-07-16 ENCOUNTER — Encounter: Payer: Self-pay | Admitting: Physician Assistant

## 2020-07-16 ENCOUNTER — Other Ambulatory Visit: Payer: Self-pay

## 2020-07-16 ENCOUNTER — Telehealth: Payer: Self-pay

## 2020-07-16 ENCOUNTER — Ambulatory Visit (INDEPENDENT_AMBULATORY_CARE_PROVIDER_SITE_OTHER): Payer: BC Managed Care – PPO | Admitting: Physician Assistant

## 2020-07-16 VITALS — BP 128/92 | HR 89 | Temp 99.0°F | Ht 63.0 in | Wt 215.4 lb

## 2020-07-16 DIAGNOSIS — Z09 Encounter for follow-up examination after completed treatment for conditions other than malignant neoplasm: Secondary | ICD-10-CM

## 2020-07-16 DIAGNOSIS — K828 Other specified diseases of gallbladder: Secondary | ICD-10-CM

## 2020-07-16 DIAGNOSIS — R1033 Periumbilical pain: Secondary | ICD-10-CM

## 2020-07-16 MED ORDER — CYCLOBENZAPRINE HCL 5 MG PO TABS: 5.0000 mg | ORAL_TABLET | Freq: Every day | ORAL | 0 refills | Status: DC

## 2020-07-16 NOTE — Telephone Encounter (Signed)
Pt returned call from LPN; acknowledging understanding of info related to the scheduling her [place] @ [facility] (08/06/20).

## 2020-07-16 NOTE — Patient Instructions (Addendum)
We will get you scheduled for an ultrasound of your abdomen. We will call you about this appointment.  We have sent you in a prescription for Flexeril.  We will call you with the results of the Ultrasound.   GENERAL POST-OPERATIVE PATIENT INSTRUCTIONS   WOUND CARE INSTRUCTIONS:  Keep a dry clean dressing on the wound if there is drainage. The initial bandage may be removed after 24 hours.  Once the wound has quit draining you may leave it open to air.  If clothing rubs against the wound or causes irritation and the wound is not draining you may cover it with a dry dressing during the daytime.  Try to keep the wound dry and avoid ointments on the wound unless directed to do so.  If the wound becomes bright red and painful or starts to drain infected material that is not clear, please contact your physician immediately.  If the wound is mildly pink and has a thick firm ridge underneath it, this is normal, and is referred to as a healing ridge.  This will resolve over the next 4-6 weeks.  BATHING: You may shower if you have been informed of this by your surgeon. However, Please do not submerge in a tub, hot tub, or pool until incisions are completely sealed or have been told by your surgeon that you may do so.  DIET:  You may eat any foods that you can tolerate.  It is a good idea to eat a high fiber diet and take in plenty of fluids to prevent constipation.  If you do become constipated you may want to take a mild laxative or take ducolax tablets on a daily basis until your bowel habits are regular.  Constipation can be very uncomfortable, along with straining, after recent surgery.  ACTIVITY:  You are encouraged to cough and deep breath or use your incentive spirometer if you were given one, every 15-30 minutes when awake.  This will help prevent respiratory complications and low grade fevers post-operatively if you had a general anesthetic.  You may want to hug a pillow when coughing and sneezing to  add additional support to the surgical area, if you had abdominal or chest surgery, which will decrease pain during these times.  You are encouraged to walk and engage in light activity for the next two weeks.  You should not lift more than 20 pounds, until 08/13/2020  as it could put you at increased risk for complications.  Twenty pounds is roughly equivalent to a plastic bag of groceries. At that time- Listen to your body when lifting, if you have pain when lifting, stop and then try again in a few days. Soreness after doing exercises or activities of daily living is normal as you get back in to your normal routine.  MEDICATIONS:  Try to take narcotic medications and anti-inflammatory medications, such as tylenol, ibuprofen, naprosyn, etc., with food.  This will minimize stomach upset from the medication.  Should you develop nausea and vomiting from the pain medication, or develop a rash, please discontinue the medication and contact your physician.  You should not drive, make important decisions, or operate machinery when taking narcotic pain medication.  SUNBLOCK Use sun block to incision area over the next year if this area will be exposed to sun. This helps decrease scarring and will allow you avoid a permanent darkened area over your incision.  QUESTIONS:  Please feel free to call our office if you have any questions, and we  will be glad to assist you. [phone]

## 2020-07-16 NOTE — Progress Notes (Signed)
[facility] POST-OP OFFICE VISIT  07/16/2020  HPI: Isbella Soto is a 40 y.o. female 14 days s/p robotic assisted laparoscopic cholecystectomy for biliary dyskinesia with Dr Aleen Campi. She was seen on POD6 with concerns over bulging and redness over her umbilical incisions but this was not thought to be infected. Since that time, she continues to report soreness and firmness in this area. The previous erythema is resolved. Otherwise, she is complaining of muscle spasm/cramping intermittently. She is trying ibuprofen without significant improvement. These seem to be worse at the ned of the day. No fever, chills, nausea, emesis. She is able to tolerated PO intake without issues. No other complaints.   Vital signs: BP (!) 128/92   Pulse 89   Temp 99 F (37.2 C) (Oral)   Ht 5\' 3"  (1.6 m)   Wt 215 lb 6.4 oz (97.7 kg)   SpO2 99%   BMI 38.16 kg/m    Physical Exam: Constitutional: Well appearing female, NAD Abdomen: Soft, incisional soreness which is mild, non-distended, no rebound/guarding Skin: Laparoscopic incisions are well healed. There does appear to be induration worse at the umbilical incision, no evidence of hernia present, no erythema or drainage   Assessment/Plan: This is a 40 y.o. female 14 days s/p robotic assisted laparoscopic cholecystectomy for biliary dyskinesia    - I do believe the firmness at her umbilical incision represents expected induration associated with scarring/healing from surgery. I do not appreciate a hernia nor fascial defect. However, she would like peace of mind and so I will obtain a 24 Abdomen Limited to rule out hernia definitively.   - Added QHS Flexeril to her pain regimen given reports of soreness worse at night time  - Reviewed lifting restrictions  - Reviewed wound care  - She will follow up prn for now; I will touch base with her regarding [place] results when obtained   -- [place], PA-C [facility] 07/16/2020,  10:42 AM [phone] M-F: 7am - 4pm

## 2020-07-16 NOTE — Telephone Encounter (Signed)
Patient is scheduled for an ultrasound of the abdomen at [facility] on 08/06/20 at 9:00 am. She will need to arrive [facility] at 8:30 am and will have nothing to eat or drink after midnight the night prior. I have left a message for the patient to call back to get this information.

## 2020-07-24 ENCOUNTER — Telehealth: Payer: Self-pay | Admitting: *Deleted

## 2020-07-24 NOTE — Telephone Encounter (Signed)
Faxed Release to Return to Work to [employer] at [phone]

## 2020-08-06 ENCOUNTER — Other Ambulatory Visit: Payer: Self-pay

## 2020-08-06 ENCOUNTER — Ambulatory Visit
Admission: RE | Admit: 2020-08-06 | Discharge: 2020-08-06 | Disposition: A | Discharge status: Home / Self Care | Payer: BC Managed Care – PPO | Source: Ambulatory Visit | Attending: Physician Assistant | Admitting: Physician Assistant

## 2020-08-06 ENCOUNTER — Ambulatory Visit (INDEPENDENT_AMBULATORY_CARE_PROVIDER_SITE_OTHER): Payer: BC Managed Care – PPO | Admitting: Gastroenterology

## 2020-08-06 ENCOUNTER — Encounter: Payer: Self-pay | Admitting: Gastroenterology

## 2020-08-06 VITALS — BP 120/82 | HR 86 | Ht 63.0 in | Wt 215.6 lb

## 2020-08-06 DIAGNOSIS — R1033 Periumbilical pain: Secondary | ICD-10-CM | POA: Insufficient documentation

## 2020-08-06 DIAGNOSIS — K429 Umbilical hernia without obstruction or gangrene: Secondary | ICD-10-CM | POA: Diagnosis not present

## 2020-08-06 DIAGNOSIS — R1013 Epigastric pain: Secondary | ICD-10-CM | POA: Diagnosis not present

## 2020-08-06 DIAGNOSIS — K3184 Gastroparesis: Secondary | ICD-10-CM

## 2020-08-06 NOTE — Progress Notes (Signed)
Wyline Mood MD, MRCP(U.K) [address]  [address]  [place], [place] [address]  Main: [phone]  Fax: [phone]   Primary Care Physician: Berniece Pap, FNP  Primary Gastroenterologist:  Dr. Wyline Mood    Follow-up for dyspepsia  HPI: Jo Soto is a 40 y.o. female   Summary of history :  Initially referred and seen on 04/23/2020 for IBS.  She also has history of GERD.  She has been issues with regurgitation, abdominal cramping right after she eats in the central part of her abdomen associated with a bowel movement.  Issues of heartburn in the past.  Had lost some weight.  On Prilosec 40 mg a day which did not help. 05/07/2020: HIDA scan: Ejection fraction 19%: Referred to Dr. Aleen Campi for cholecystectomy 05/06/2020: EGD: Food seen in the stomach moderate in quantity.   Interval history   05/09/2020-08/06/2020  05/29/2020: Gastric emptying study: Normal 07/02/2020: Cholecystectomy: Pathology specimen demonstrated chronic cholecystitis She is doing well after her cholecystectomy with no complaints.  Having a bit of soft bowel movement as expected.  No other complaints    Current Outpatient Medications  Medication Sig Dispense Refill  . acetaminophen (TYLENOL) 500 MG tablet Take 2 tablets (1,000 mg total) by mouth every 6 (six) hours as needed for mild pain.    Marland Kitchen buPROPion (WELLBUTRIN XL) 300 MG 24 hr tablet Take 1 tablet (300 mg total) by mouth daily. 90 tablet 1  . cyclobenzaprine (FLEXERIL) 5 MG tablet Take 1 tablet (5 mg total) by mouth at bedtime. 20 tablet 0  . dicyclomine (BENTYL) 10 MG capsule Take 1 capsule (10 mg total) by mouth 4 (four) times daily -  before meals and at bedtime. (Patient taking differently: Take 20 mg by mouth in the morning and at bedtime.) 90 capsule 2  . levonorgestrel (MIRENA) 20 MCG/24HR IUD 1 each by Intrauterine route once.    . Multiple Vitamin (DAILY-VITAMIN PO) Take 1 tablet by mouth daily.    . Multiple Vitamins-Minerals  (HAIR/SKIN/NAILS) TABS Take 1 tablet by mouth daily.    Marland Kitchen omeprazole (PRILOSEC) 40 MG capsule Take 1 capsule (40 mg total) by mouth in the morning and at bedtime. 180 capsule 0  . Vitamin D, Ergocalciferol, (DRISDOL) 1.25 MG (50000 UNIT) CAPS capsule Take 1 capsule (50,000 Units total) by mouth every 7 (seven) days. (taking one tablet per week) walk in lab in office 1-2 weeks after completing prescription. 12 capsule 0   No current facility-administered medications for this visit.    Allergies as of 08/06/2020  . (No Known Allergies)    ROS:  General: Negative for anorexia, weight loss, fever, chills, fatigue, weakness. ENT: Negative for hoarseness, difficulty swallowing , nasal congestion. CV: Negative for chest pain, angina, palpitations, dyspnea on exertion, peripheral edema.  Respiratory: Negative for dyspnea at rest, dyspnea on exertion, cough, sputum, wheezing.  GI: See history of present illness. GU:  Negative for dysuria, hematuria, urinary incontinence, urinary frequency, nocturnal urination.  Endo: Negative for unusual weight change.    Physical Examination:   BP 120/82 (BP Location: Left Arm, Patient Position: Sitting, Cuff Size: Large)   Pulse 86   Ht 5\' 3"  (1.6 m)   Wt 215 lb 9.6 oz (97.8 kg)   BMI 38.19 kg/m   General: Well-nourished, well-developed in no acute distress.  Eyes: No icterus. Conjunctivae pink. Abdomen: Bowel sounds are normal, nontender, nondistended, no hepatosplenomegaly or masses, no abdominal bruits or hernia , no rebound or guarding.   Extremities:  No lower extremity edema. No clubbing or deformities. Neuro: Alert and oriented x 3.  Grossly intact. Skin: Warm and dry, no jaundice.   Psych: Alert and cooperative, normal mood and affect.   Imaging Studies: No results found.  Assessment and Plan:   Jo Soto is a 40 y.o. y/o female here to follow-up for dyspepsia. HIDA scan that showed a very low ejection fraction of 19% which could be  contributing to her symptoms, status postcholecystectomy in April 2022 in addition endoscopy showed food in her stomach despite fasting for over 12 hours suggesting gastroparesis.  The GERD that the patient has could be very much related to the gastroparesis.  Plan 1. Continue PPI 2.  Gastroparesis diet  Dr Wyline Mood  MD,MRCP [facility]) Follow up in as needed

## 2020-08-08 ENCOUNTER — Other Ambulatory Visit: Payer: Self-pay | Admitting: Physician Assistant

## 2020-08-08 ENCOUNTER — Encounter: Payer: Self-pay | Admitting: Surgery

## 2020-08-08 DIAGNOSIS — R1033 Periumbilical pain: Secondary | ICD-10-CM

## 2020-08-09 ENCOUNTER — Telehealth: Payer: Self-pay

## 2020-08-09 DIAGNOSIS — R1033 Periumbilical pain: Secondary | ICD-10-CM

## 2020-08-09 NOTE — Telephone Encounter (Signed)
Scheduled CT for pt concerning periumbilical hernia on August 21, 2020 @ [facility] [place] at 9:30 am.

## 2020-08-21 ENCOUNTER — Other Ambulatory Visit: Payer: Self-pay

## 2020-08-21 ENCOUNTER — Ambulatory Visit
Admission: RE | Admit: 2020-08-21 | Discharge: 2020-08-21 | Disposition: A | Discharge status: Home / Self Care | Payer: BC Managed Care – PPO | Source: Ambulatory Visit | Attending: Physician Assistant | Admitting: Physician Assistant

## 2020-08-21 ENCOUNTER — Other Ambulatory Visit: Payer: Self-pay | Admitting: Surgery

## 2020-08-21 DIAGNOSIS — R1033 Periumbilical pain: Secondary | ICD-10-CM | POA: Insufficient documentation

## 2020-08-21 DIAGNOSIS — Z9049 Acquired absence of other specified parts of digestive tract: Secondary | ICD-10-CM | POA: Diagnosis not present

## 2020-08-21 DIAGNOSIS — K429 Umbilical hernia without obstruction or gangrene: Secondary | ICD-10-CM | POA: Diagnosis not present

## 2020-08-23 ENCOUNTER — Telehealth: Payer: Self-pay | Admitting: *Deleted

## 2020-08-23 NOTE — Telephone Encounter (Signed)
Patient called and wants to pick up or print it from [employer] [place] a return to work note. She returned to work on 08/20/20

## 2020-08-23 NOTE — Telephone Encounter (Signed)
A return to work note has been sent to the patient's MyChart. She will let us know if she needs anything else.

## 2020-09-02 ENCOUNTER — Ambulatory Visit: Admit: 2020-09-02 | Payer: BC Managed Care – PPO | Admitting: Surgery

## 2020-09-02 ENCOUNTER — Other Ambulatory Visit: Payer: Self-pay

## 2020-09-02 ENCOUNTER — Ambulatory Visit (INDEPENDENT_AMBULATORY_CARE_PROVIDER_SITE_OTHER): Payer: BC Managed Care – PPO | Admitting: Surgery

## 2020-09-02 ENCOUNTER — Encounter: Payer: Self-pay | Admitting: Surgery

## 2020-09-02 VITALS — BP 130/86 | HR 85 | Temp 98.9°F | Ht 63.0 in | Wt 214.2 lb

## 2020-09-02 DIAGNOSIS — K432 Incisional hernia without obstruction or gangrene: Secondary | ICD-10-CM

## 2020-09-02 SURGERY — REPAIR, HERNIA, VENTRAL, ROBOT-ASSISTED
Anesthesia: General

## 2020-09-02 NOTE — Patient Instructions (Signed)
Our surgery scheduler Barbara will call you within 24-48 hours to get you scheduled. If you have not heard from her after 48 hours, please call our office. You will not need to get Covid tested before surgery and have the blue sheet available when she calls to write down important information.  If you have any concerns or questions, please feel free to call our office.    Umbilical Hernia, Adult  A hernia is a bulge of tissue that pushes through an opening between muscles. An umbilical hernia happens in the abdomen, near the belly button (umbilicus). The hernia may contain tissues from the small intestine, large intestine, or fatty tissue covering the intestines (omentum). Umbilical hernias in adults tend to get worse over time, and they require surgical treatment. There are several types of umbilical hernias. You may have:  A hernia located just above or below the umbilicus (indirect hernia). This is the most common type of umbilical hernia in adults.  A hernia that forms through an opening formed by the umbilicus (direct hernia).  A hernia that comes and goes (reducible hernia). A reducible hernia may be visible only when you strain, lift something heavy, or cough. This type of hernia can be pushed back into the abdomen (reduced).  A hernia that traps abdominal tissue inside the hernia (incarcerated hernia). This type of hernia cannot be reduced.  A hernia that cuts off blood flow to the tissues inside the hernia (strangulated hernia). The tissues can start to die if this happens. This type of hernia requires emergency treatment. What are the causes? An umbilical hernia happens when tissue inside the abdomen presses on a weak area of the abdominal muscles. What increases the risk? You may have a greater risk of this condition if you:  Are obese.  Have had several pregnancies.  Have a buildup of fluid inside your abdomen (ascites).  Have had surgery that weakens the abdominal  muscles. What are the signs or symptoms? The main symptom of this condition is a painless bulge at or near the belly button. A reducible hernia may be visible only when you strain, lift something heavy, or cough. Other symptoms may include:  Dull pain.  A feeling of pressure. Symptoms of a strangulated hernia may include:  Pain that gets increasingly worse.  Nausea and vomiting.  Pain when pressing on the hernia.  Skin over the hernia becoming red or purple.  Constipation.  Blood in the stool. How is this diagnosed? This condition may be diagnosed based on:  A physical exam. You may be asked to cough or strain while standing. These actions increase the pressure inside your abdomen and force the hernia through the opening in your muscles. Your health care provider may try to reduce the hernia by pressing on it.  Your symptoms and medical history. How is this treated? Surgery is the only treatment for an umbilical hernia. Surgery for a strangulated hernia is done as soon as possible. If you have a small hernia that is not incarcerated, you may need to lose weight before having surgery. Follow these instructions at home:  Lose weight, if told by your health care provider.  Do not try to push the hernia back in.  Watch your hernia for any changes in color or size. Tell your health care provider if any changes occur.  You may need to avoid activities that increase pressure on your hernia.  Do not lift anything that is heavier than 10 lb (4.5 kg) until your   health care provider says that this is safe.  Take over-the-counter and prescription medicines only as told by your health care provider.  Keep all follow-up visits as told by your health care provider. This is important. Contact a health care provider if:  Your hernia gets larger.  Your hernia becomes painful. Get help right away if:  You develop sudden, severe pain near the area of your hernia.  You have pain as  well as nausea or vomiting.  You have pain and the skin over your hernia changes color.  You develop a fever. This information is not intended to replace advice given to you by your health care provider. Make sure you discuss any questions you have with your health care provider. Document Revised: 04/21/2017 Document Reviewed: 09/07/2016 Elsevier Patient Education  2021 Elsevier Inc.  

## 2020-09-02 NOTE — Progress Notes (Signed)
09/02/2020  History of Present Illness: Jo Soto is a 40 y.o. female status post robotic assisted cholecystectomy on 07/02/2020 for biliary dyskinesia.  Patient has been having discomfort in the umbilical area and a CT scan was done on 08/21/2020.  I have personally viewed the images and there is an incisional hernia at the umbilical port site containing fat only.  The right upper quadrant area is unremarkable with no biloma or fluid collections.  Today the patient presents for follow up.  She reports discomfort in the umbilical area at the prior port site, particularly with exertion.  She has returned to work and doing well from that standpoint, but still trying to finalize some paperwork from her disability from her last surgery.  Denies any nausea, vomiting, fevers, chills.  Denies other areas of discomfort.  Past Medical History: Past Medical History:  Diagnosis Date   Allergy    Anxiety    Depression    GERD (gastroesophageal reflux disease)    Heartburn    Migraines      Past Surgical History: Past Surgical History:  Procedure Laterality Date   ANTERIOR CRUCIATE LIGAMENT REPAIR  12/2017   CHOLECYSTECTOMY     ESOPHAGOGASTRODUODENOSCOPY (EGD) WITH PROPOFOL N/A 05/06/2020   Procedure: ESOPHAGOGASTRODUODENOSCOPY (EGD) WITH PROPOFOL;  Surgeon: Wyline Mood, MD;  Location: [facility] ENDOSCOPY;  Service: Gastroenterology;  Laterality: N/A;  COVID POSITIVE 04/03/2020   TUMOR REMOVAL  12/2014   Abdominal    Home Medications: Prior to Admission medications   Medication Sig Start Date End Date Taking? Authorizing Provider  acetaminophen (TYLENOL) 500 MG tablet Take 2 tablets (1,000 mg total) by mouth every 6 (six) hours as needed for mild pain. 07/02/20   Balthazar Dooly, Elita Quick, MD  buPROPion (WELLBUTRIN XL) 300 MG 24 hr tablet Take 1 tablet (300 mg total) by mouth daily. 06/03/20   Flinchum, Eula Fried, FNP  cyclobenzaprine (FLEXERIL) 5 MG tablet Take 1 tablet (5 mg total) by mouth at bedtime. 07/16/20    Donovan Kail, PA-C  dicyclomine (BENTYL) 10 MG capsule Take 1 capsule (10 mg total) by mouth 4 (four) times daily -  before meals and at bedtime. Patient taking differently: Take 20 mg by mouth in the morning and at bedtime. 04/23/20   Wyline Mood, MD  levonorgestrel (MIRENA) 20 MCG/24HR IUD 1 each by Intrauterine route once.    [provider]  Multiple Vitamin (DAILY-VITAMIN PO) Take 1 tablet by mouth daily.    [provider]  Multiple Vitamins-Minerals (HAIR/SKIN/NAILS) TABS Take 1 tablet by mouth daily.    [provider]  omeprazole (PRILOSEC) 40 MG capsule Take 1 capsule (40 mg total) by mouth in the morning and at bedtime. 06/03/20   Flinchum, Eula Fried, FNP  Vitamin D, Ergocalciferol, (DRISDOL) 1.25 MG (50000 UNIT) CAPS capsule Take 1 capsule (50,000 Units total) by mouth every 7 (seven) days. (taking one tablet per week) walk in lab in office 1-2 weeks after completing prescription. 06/05/20   Flinchum, Eula Fried, FNP    Allergies: No Known Allergies  Review of Systems: Review of Systems  Constitutional:  Negative for chills and fever.  HENT:  Negative for hearing loss.   Respiratory:  Negative for shortness of breath.   Cardiovascular:  Negative for chest pain.  Gastrointestinal:  Positive for abdominal pain. Negative for constipation, diarrhea, nausea and vomiting.  Genitourinary:  Negative for dysuria.  Musculoskeletal:  Negative for myalgias.  Skin:  Negative for rash.  Neurological:  Negative for dizziness.  Psychiatric/Behavioral:  Negative for depression.    Physical Exam BP 130/86   Pulse 85   Temp 98.9 F (37.2 C) (Oral)   Ht 5\' 3"  (1.6 m)   Wt 214 lb 3.2 oz (97.2 kg)   SpO2 99%   BMI 37.94 kg/m  CONSTITUTIONAL: No acute distress, wearing mask. HEENT:  Normocephalic, atraumatic, extraocular motion intact. RESPIRATORY:  Lungs are clear, and breath sounds are equal bilaterally. Normal respiratory effort without pathologic use of  accessory muscles. CARDIOVASCULAR: Heart is regular without murmurs, gallops, or rubs. GI: The abdomen is soft, non-distended, with discomfort to palpation at the umbilicus.  There is mild firmness at the umbilical area which is her scar tissue and protruding hernia, which is small.  Unable to reduce due to some discomfort, but otherwise the area is soft without overlying skin changes.  Other incisions are well healed. MUSCULOSKELETAL:  Normal gait, no peripheral edema. SKIN:  No visible pathology skin lesions.  Has tattoos in her lower extremities. NEUROLOGIC:  Motor and sensation is grossly normal.  Cranial nerves are grossly intact. PSYCH:  Alert and oriented to person, place and time. Affect is normal.  Labs/Imaging: CT scan abdomen/pelvis 08/21/20 IMPRESSION: Small umbilical hernia containing fat.   Stranding at the periumbilical soft tissues consistent with recent surgery, though cannot exclude cellulitis by imaging.   No acute intra-abdominal or intrapelvic abnormalities.    Assessment and Plan: This is a 40 y.o. female with an incisional hernia at prior umbilical port.  --Discussed with the patient her CT scan results and discussed that she has an incisional hernia at the umbilicus, where one of the ports were located from her robotic cholecystectomy.  The RUQ area does not show any complications or issues.  She does have discomfort at the umbilicus.  She's recovered from her prior surgery and is back to work now, but is also finalizing her paperwork with her disability.  She would like to proceed with surgery, but she wants to make sure that paperwork is completed and that she'd be able to take time off again.  --Reviewed with her the plan for robotic incisional hernia repair to close the defect and to place mesh underneath to reinforce the repair.  Discussed with her the surgery at length and reviewed the risks of bleeding, infection, and injury to surrounding structures.  Discussed  that this is also an outpatient surgery, recovery time, activity restrictions, and she's willing to proceed. --She will call 24 to schedule surgery.  She is aware that if this is more than 30 days out, we would need a new follow up appointment for H&P update.  Face-to-face time spent with the patient and care providers was 40 minutes, with more than 50% of the time spent counseling, educating, and coordinating care of the patient.     [place], MD [facility]

## 2020-09-05 ENCOUNTER — Telehealth: Payer: Self-pay | Admitting: Surgery

## 2020-09-05 ENCOUNTER — Telehealth: Payer: Self-pay | Admitting: *Deleted

## 2020-09-05 NOTE — Telephone Encounter (Signed)
Outgoing call is made to the patient to see if she is ready to schedule surgery.  At this time, she is still awaiting to finalize things from a previous surgery that she had and has to check with her employer to see if she has enough time to do another surgery and take time off from work. She will call us back.

## 2020-09-05 NOTE — Telephone Encounter (Signed)
Faxed FMLA to [employer] at [phone]

## 2020-09-08 ENCOUNTER — Other Ambulatory Visit: Payer: Self-pay | Admitting: Adult Health

## 2020-09-08 DIAGNOSIS — E559 Vitamin D deficiency, unspecified: Secondary | ICD-10-CM

## 2020-09-11 ENCOUNTER — Encounter: Payer: Self-pay | Admitting: Surgery

## 2020-09-11 ENCOUNTER — Other Ambulatory Visit: Payer: Self-pay

## 2020-09-11 ENCOUNTER — Ambulatory Visit (INDEPENDENT_AMBULATORY_CARE_PROVIDER_SITE_OTHER): Payer: BC Managed Care – PPO | Admitting: Surgery

## 2020-09-11 VITALS — BP 138/94 | HR 84 | Temp 98.4°F | Ht 63.0 in | Wt 212.8 lb

## 2020-09-11 DIAGNOSIS — K432 Incisional hernia without obstruction or gangrene: Secondary | ICD-10-CM

## 2020-09-11 MED ORDER — OXYCODONE HCL 5 MG PO TABS: 5.0000 mg | ORAL_TABLET | Freq: Four times a day (QID) | ORAL | 0 refills | Status: DC | PRN

## 2020-09-11 NOTE — Patient Instructions (Signed)
Please call our office when you decide to move forward with scheduling your surgery.   Our surgery scheduler will call you within 24-48 hours to schedule your surgery.Please have the Blue surgery sheet available when speaking with her.   Umbilical Hernia, Adult  A hernia is a bulge of tissue that pushes through an opening between muscles. An umbilical hernia happens in the abdomen, near the belly button (umbilicus). The hernia may contain tissues from the small intestine, large intestine, or fatty tissue covering the intestines (omentum). Umbilical hernias in adults tend to get worse over time, and they requiresurgical treatment. There are several types of umbilical hernias. You may have: A hernia located just above or below the umbilicus (indirect hernia). This is the most common type of umbilical hernia in adults. A hernia that forms through an opening formed by the umbilicus (direct hernia). A hernia that comes and goes (reducible hernia). A reducible hernia may be visible only when you strain, lift something heavy, or cough. This type of hernia can be pushed back into the abdomen (reduced). A hernia that traps abdominal tissue inside the hernia (incarcerated hernia). This type of hernia cannot be reduced. A hernia that cuts off blood flow to the tissues inside the hernia (strangulated hernia). The tissues can start to die if this happens. This type of hernia requires emergency treatment. What are the causes? An umbilical hernia happens when tissue inside the abdomen presses on a weakarea of the abdominal muscles. What increases the risk? You may have a greater risk of this condition if you: Are obese. Have had several pregnancies. Have a buildup of fluid inside your abdomen (ascites). Have had surgery that weakens the abdominal muscles. What are the signs or symptoms? The main symptom of this condition is a painless bulge at or near the belly button. A reducible hernia may be visible only  when you strain, lift something heavy, or cough. Other symptoms may include: Dull pain. A feeling of pressure. Symptoms of a strangulated hernia may include: Pain that gets increasingly worse. Nausea and vomiting. Pain when pressing on the hernia. Skin over the hernia becoming red or purple. Constipation. Blood in the stool. How is this diagnosed? This condition may be diagnosed based on: A physical exam. You may be asked to cough or strain while standing. These actions increase the pressure inside your abdomen and force the hernia through the opening in your muscles. Your health care provider may try to reduce the hernia by pressing on it. Your symptoms and medical history. How is this treated? Surgery is the only treatment for an umbilical hernia. Surgery for a strangulated hernia is done as soon as possible. If you have a small herniathat is not incarcerated, you may need to lose weight before having surgery. Follow these instructions at home: Lose weight, if told by your health care provider. Do not try to push the hernia back in. Watch your hernia for any changes in color or size. Tell your health care provider if any changes occur. You may need to avoid activities that increase pressure on your hernia. Do not lift anything that is heavier than 10 lb (4.5 kg) until your health care provider says that this is safe. Take over-the-counter and prescription medicines only as told by your health care provider. Keep all follow-up visits as told by your health care provider. This is important. Contact a health care provider if: Your hernia gets larger. Your hernia becomes painful. Get help right away if: You develop  sudden, severe pain near the area of your hernia. You have pain as well as nausea or vomiting. You have pain and the skin over your hernia changes color. You develop a fever. This information is not intended to replace advice given to you by your health care provider. Make  sure you discuss any questions you have with your healthcare provider. Document Revised: 04/21/2017 Document Reviewed: 09/07/2016 Elsevier Patient Education  2021 [employer].

## 2020-09-11 NOTE — Progress Notes (Signed)
09/11/2020  HPI: Jo Soto is a 40 y.o. female s/p robotic assisted cholecystectomy on 07/02/20 for biliary dyskinesia.  She was found to have an incisional hernia at the umbilical port on 08/21/20 on CT scan.  She presents today because she's been having worse abdominal pain recently.  She describes it as stabbing pain in the epigastric area that comes and goes.  She has tried muscle relaxant and ibuprofen, but has not helped too much.  Denies any nausea, vomiting.  Reports normal bowel function and denies heartburn or difficulty eating.  Vital signs: BP (!) 138/94   Pulse 84   Temp 98.4 F (36.9 C)   Ht 5\' 3"  (1.6 m)   Wt 212 lb 12.8 oz (96.5 kg)   BMI 37.70 kg/m    Physical Exam: Constitutional:  No acute distress Abdomen:  Soft, obese, non-distended, with tenderness to palpation in the epigastric area mostly, but also in the umbilical area.  The hernia is palpable, without evidence of incarceration or strangulation.  There is no other palpable hernia or mass in the epigastric area in the area of pain.  Assessment/Plan: This is a 40 y.o. female s/p robotic assisted cholecystectomy, with incisional hernia.  --Discussed with the patient that she does not have a strangulated or incarcerated hernia.  I think the pain may be related to the tugging/pulling of the preperitoneal fat that is pushing through the hernia.  There's no evidence of a new hernia.  However, given her symptoms, I still think we should proceed with hernia repair.  She reports that her job just got the paperwork from her disability forms and she's going to talk to them today/tomorrow about how many more days she has to see how much time she can take for this new surgery.  Discussed with her that it would be best to have a robotic approach so that we can also evaluate the abdominal contents and the epigastric abdominal wall to check for other possible hernias, though no other are seen on her CT scan.  In the meantime, will  give her a short prescription for oxycodone. --Patient will call 24 back with timing for surgery so we can get this scheduled as soon as possible based on her timing and work.  Discussed return precautions.   [place], MD [facility]

## 2020-10-01 ENCOUNTER — Telehealth: Payer: Self-pay | Admitting: Surgery

## 2020-10-01 NOTE — Telephone Encounter (Signed)
Patient has been advised of Pre-Admission date/time, COVID Testing date and Surgery date.  Surgery Date: 11/07/20 Preadmission Testing Date: 10/31/20 (phone 8a-1p) Covid Testing Date: Not needed.    Patient has been made aware to call [phone], between 1-3:00pm the day before surgery, to find out what time to arrive for surgery.   Also follow up appointment has been made for patient to see Dr. Aleen Campi on 11/04/20 prior to her surgery.  Patient verbalized understanding.

## 2020-10-03 ENCOUNTER — Telehealth: Payer: Self-pay | Admitting: *Deleted

## 2020-10-03 NOTE — Telephone Encounter (Signed)
Faxed FMLA to [employer] and Disability Team at [phone]

## 2020-10-14 ENCOUNTER — Ambulatory Visit
Admission: RE | Admit: 2020-10-14 | Discharge: 2020-10-14 | Disposition: A | Discharge status: Home / Self Care | Payer: BC Managed Care – PPO | Source: Ambulatory Visit | Attending: Internal Medicine | Admitting: Internal Medicine

## 2020-10-14 ENCOUNTER — Telehealth: Payer: Self-pay

## 2020-10-14 ENCOUNTER — Encounter: Payer: Self-pay | Admitting: Internal Medicine

## 2020-10-14 ENCOUNTER — Other Ambulatory Visit: Payer: Self-pay

## 2020-10-14 ENCOUNTER — Ambulatory Visit (INDEPENDENT_AMBULATORY_CARE_PROVIDER_SITE_OTHER): Payer: BC Managed Care – PPO | Admitting: Internal Medicine

## 2020-10-14 VITALS — BP 135/90 | HR 80 | Temp 98.6°F | Ht 62.99 in | Wt 214.6 lb

## 2020-10-14 DIAGNOSIS — M25532 Pain in left wrist: Secondary | ICD-10-CM | POA: Diagnosis not present

## 2020-10-14 MED ORDER — DICLOFENAC SODIUM 1 % EX GEL: 2.0000 g | Freq: Four times a day (QID) | CUTANEOUS | 1 refills | Status: DC

## 2020-10-14 NOTE — Telephone Encounter (Signed)
Pt was seeing Michelle Flinchum. Pt is scheduled to see someone today at [facility] scheduled by our office. Received FMLA forms. Can we complete forms? Forms placed in Dr. Elisabeth Cara box. Please advise.

## 2020-10-14 NOTE — Telephone Encounter (Signed)
Please advise 

## 2020-10-14 NOTE — Progress Notes (Signed)
BP 135/90   Pulse 80   Temp 98.6 F (37 C) (Oral)   Ht 5' 2.99" (1.6 m)   Wt 214 lb 9.6 oz (97.3 kg)   SpO2 96%   BMI 38.02 kg/m    Subjective:    Patient ID: Jo Soto, female    DOB: [birth year], 40 y.o.   MRN: [id number]  Chief Complaint  Patient presents with   Wrist Pain    Left wrist pain for the past month, patient states it is hard to hold on to and grasp items. Also that the pain is constatnt    HPI: Jo Soto is a 40 y.o. female  Patient presents with: Wrist Pain: Left wrist pain for the past month, patient states it is hard to hold on to and grasp items. Also that the pain is constatnt    Wrist Pain  The pain is present in the left wrist. This is a new (has wrist pain and tingling as well. 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 The current episode started more than 1 month ago. The quality of the pain is described as sharp. The pain is at a severity of 6/10. The pain is moderate. Associated symptoms include an inability to bear weight, joint swelling, a limited range of motion and tingling. Pertinent negatives include no fever, itching, joint locking, numbness or stiffness.   Chief Complaint  Patient presents with   Wrist Pain    Left wrist pain for the past month, patient states it is hard to hold on to and grasp items. Also that the pain is constatnt    Relevant past medical, surgical, family and social history reviewed and updated as indicated. Interim medical history since our last visit reviewed. Allergies and medications reviewed and updated.  Review of Systems  Constitutional:  Negative for fever.  Musculoskeletal:  Negative for stiffness.  Skin:  Negative for itching.  Neurological:  Positive for tingling. Negative for numbness.   Per HPI unless specifically indicated above     Objective:    BP 135/90   Pulse 80   Temp 98.6 F (37 C) (Oral)   Ht 5' 2.99" (1.6 m)   Wt 214 lb  9.6 oz (97.3 kg)   SpO2 96%   BMI 38.02 kg/m   Wt Readings from Last 3 Encounters:  10/14/20 214 lb 9.6 oz (97.3 kg)  09/11/20 212 lb 12.8 oz (96.5 kg)  09/02/20 214 lb 3.2 oz (97.2 kg)    Physical Exam Vitals and nursing note reviewed.  Constitutional:      General: She is not in acute distress.    Appearance: Normal appearance. She is not ill-appearing or diaphoretic.  Eyes:     Conjunctiva/sclera: Conjunctivae normal.  Skin:    Coloration: Skin is not jaundiced.     Findings: No erythema.  Neurological:     Mental Status: She is alert.   Results for orders placed or performed during the hospital encounter of 07/02/20  Pregnancy, urine POC  Result Value Ref Range   Preg Test, Ur NEGATIVE NEGATIVE  Surgical pathology  Result Value Ref Range   SURGICAL PATHOLOGY      SURGICAL PATHOLOGY CASE: [id number] PATIENT: Jo Soto Surgical Pathology Report     Specimen Submitted: A. Gallbladder  Clinical History: Biliary dyskinesia      DIAGNOSIS: A.  GALLBLADDER; CHOLECYSTECTOMY: - CHRONIC CHOLECYSTITIS. - NEGATIVE FOR MALIGNANCY.  GROSS DESCRIPTION: A. Labeled: Gallbladder Received:  Formalin Collection time: 8:50 AM on 07/02/2020 Placed into formalin time: 8:57 AM on 07/02/2020 Size of specimen: 7.6 x 3.9 x 2.4 cm Specimen integrity: Intact External surface: The serosa is purple-green, smooth, and glistening with a roughened hepatic bed. Wall thickness: Ranges from 0.1-0.2 cm Mucosa: The mucosa is tan-green and velvety.  Cholesterolosis is absent. Cystic duct: The cystic duct is 1 x 0.3 x 0.2 cm.  The duct is patent and grossly unremarkable.  A distinct adjacent lymph node candidate is not grossly identified. Bile present: There is tan-green viscous bile. Stones present: No distinct stones are grossly identified.  Other findings: None grossly appreciated.  Block summary: 1 - cystic duct resection margin, en face and inked blue, with representative  wall  RB 07/02/2020  Final Diagnosis performed by Elijah Birk, MD.   Electronically signed 07/03/2020 9:51:47AM The electronic signature indicates that the named Attending Pathologist has evaluated the specimen Technical component performed at [facility], [address] [place], [place] [address] Lab: [phone] Dir: Jolene Schimke, MD, MMM  Professional component performed at [facility], [place], [address] [place], [place], [place] [address] Lab: [phone] Dir: Georgiann Cocker. Rubinas, MD         Current Outpatient Medications:    buPROPion (WELLBUTRIN XL) 300 MG 24 hr tablet, Take 1 tablet (300 mg total) by mouth daily., Disp: 90 tablet, Rfl: 1   Levonorgestrel (MIRENA, 52 MG, IU), Mirena 20 mcg/24 hours (7 yrs) 52 mg intrauterine device  Take by intrauterine route., Disp: , Rfl:    Multiple Vitamin (DAILY-VITAMIN PO), Take 1 tablet by mouth daily., Disp: , Rfl:    Multiple Vitamins-Minerals (HAIR/SKIN/NAILS) TABS, Take 1 tablet by mouth daily., Disp: , Rfl:    omeprazole (PRILOSEC) 40 MG capsule, Take 1 capsule (40 mg total) by mouth in the morning and at bedtime., Disp: 180 capsule, Rfl: 0   ibuprofen (ADVIL) 600 MG tablet, Take 600 mg by mouth every 8 (eight) hours as needed., Disp: , Rfl:    oxyCODONE (ROXICODONE) 5 MG immediate release tablet, Take 1 tablet (5 mg total) by mouth every 6 (six) hours as needed for severe pain. (Patient not taking: Reported on 10/14/2020), Disp: 15 tablet, Rfl: 0   Vitamin D, Ergocalciferol, (DRISDOL) 1.25 MG (50000 UNIT) CAPS capsule, Take 1 capsule (50,000 Units total) by mouth every 7 (seven) days. (taking one tablet per week) walk in lab in office 1-2 weeks after completing prescription. (Patient not taking: Reported on 10/14/2020), Disp: 12 capsule, Rfl: 0    Assessment & Plan:  Wrist pain will check xtrays as belwo Will refer to hand surgeons for second opinion.  Will start pt on voltaren gel.   Problem List Items Addressed This  Visit   None   Orders Placed This Encounter  Procedures   DG Wrist Complete Left   Ambulatory referral to Hand Surgery     Meds ordered this encounter  Medications   diclofenac Sodium (VOLTAREN) 1 % GEL    Sig: Apply 2 g topically 4 (four) times daily.    Dispense:  50 g    Refill:  1     Follow up plan: No follow-ups on file.

## 2020-10-15 NOTE — Progress Notes (Signed)
Please let pt know this was normal. Neeeds to fu with pcp for further mx.

## 2020-10-21 DIAGNOSIS — S63502A Unspecified sprain of left wrist, initial encounter: Secondary | ICD-10-CM | POA: Diagnosis not present

## 2020-10-21 DIAGNOSIS — S65502A Unspecified injury of blood vessel of right middle finger, initial encounter: Secondary | ICD-10-CM | POA: Diagnosis not present

## 2020-10-27 DIAGNOSIS — G4733 Obstructive sleep apnea (adult) (pediatric): Secondary | ICD-10-CM | POA: Diagnosis not present

## 2020-10-27 DIAGNOSIS — R0602 Shortness of breath: Secondary | ICD-10-CM | POA: Diagnosis not present

## 2020-10-28 ENCOUNTER — Other Ambulatory Visit: Payer: Self-pay

## 2020-10-28 ENCOUNTER — Telehealth: Payer: Self-pay | Admitting: Adult Health

## 2020-10-28 DIAGNOSIS — G4733 Obstructive sleep apnea (adult) (pediatric): Secondary | ICD-10-CM | POA: Diagnosis not present

## 2020-10-28 DIAGNOSIS — K219 Gastro-esophageal reflux disease without esophagitis: Secondary | ICD-10-CM

## 2020-10-28 DIAGNOSIS — R0602 Shortness of breath: Secondary | ICD-10-CM | POA: Diagnosis not present

## 2020-10-28 MED ORDER — OMEPRAZOLE 40 MG PO CPDR: 40.0000 mg | DELAYED_RELEASE_CAPSULE | Freq: Two times a day (BID) | ORAL | 0 refills | Status: DC

## 2020-10-28 NOTE — Telephone Encounter (Signed)
ne

## 2020-10-28 NOTE — Telephone Encounter (Signed)
[facility] faxed refill request for the following medications: ? ?omeprazole (PRILOSEC) 40 MG capsule  ? ?Please advise. ? ? ? ?

## 2020-10-31 ENCOUNTER — Other Ambulatory Visit
Admission: RE | Admit: 2020-10-31 | Discharge: 2020-10-31 | Disposition: A | Discharge status: Home / Self Care | Payer: BC Managed Care – PPO | Source: Ambulatory Visit | Attending: Surgery | Admitting: Surgery

## 2020-10-31 NOTE — Patient Instructions (Signed)
Your procedure is scheduled on: October 07, 2020 THURSDAY Report to the Registration Desk on the 1st floor of the [facility] To find out your arrival time, please call [phone] between 1PM - 3PM on: October 06, 2020 [facility]  REMEMBER: Instructions that are not followed completely may result in serious medical risk, up to and including death; or upon the discretion of your surgeon and anesthesiologist your surgery may need to be rescheduled.  Do not eat food after midnight the night before surgery.  No gum chewing, lozengers or hard candies.  You may however, drink CLEAR liquids up to 2 hours before you are scheduled to arrive for your surgery. Do not drink anything within 2 hours of your scheduled arrival time.  Clear liquids include: - water  - apple juice without pulp - gatorade (not RED, PURPLE, OR BLUE) - black coffee or tea (Do NOT add milk or creamers to the coffee or tea) Do NOT drink anything that is not on this list.  Type 1 and Type 2 diabetics should only drink water.  In addition, your doctor has ordered for you to drink the provided  Ensure Pre-Surgery Clear Carbohydrate Drink  Gatorade G2 Drinking this carbohydrate drink up to two hours before surgery helps to reduce insulin resistance and improve patient outcomes. Please complete drinking 2 hours prior to scheduled arrival time.  TAKE THESE MEDICATIONS THE MORNING OF SURGERY WITH A SIP OF WATER:  (take one the night before and one on the morning of surgery - helps to prevent nausea after surgery.)  Use inhalers on the day of surgery and bring to the hospital.  Follow recommendations from Cardiologist, Pulmonologist or PCP regarding stopping Aspirin, Coumadin, Plavix, Eliquis, Pradaxa, or Pletal.  One week prior to surgery: Stop Anti-inflammatories (NSAIDS) such as Advil, Aleve, Ibuprofen, Motrin, Naproxen, Naprosyn and Aspirin based products such as Excedrin, Goodys Powder, BC Powder. Stop ANY OVER THE COUNTER  supplements until after surgery. You may however, continue to take Tylenol if needed for pain up until the day of surgery.  No Alcohol for 24 hours before or after surgery.  No Smoking including e-cigarettes for 24 hours prior to surgery.  No chewable tobacco products for at least 6 hours prior to surgery.  No nicotine patches on the day of surgery.  Do not use any "recreational" drugs for at least a week prior to your surgery.  Please be advised that the combination of cocaine and anesthesia may have negative outcomes, up to and including death. If you test positive for cocaine, your surgery will be cancelled.  On the morning of surgery brush your teeth with toothpaste and water, you may rinse your mouth with mouthwash if you wish. Do not swallow any toothpaste or mouthwash.  Do not wear jewelry, make-up, hairpins, clips or nail polish.  Do not wear lotions, powders, or perfumes.   Do not shave body from the neck down 48 hours prior to surgery just in case you cut yourself which could leave a site for infection.  Also, freshly shaved skin may become irritated if using the CHG soap.  Contact lenses, hearing aids and dentures may not be worn into surgery.  Do not bring valuables to the hospital. [facility] is not responsible for any missing/lost belongings or valuables.   Use CHG Soap as directed on instruction sheet.  Bring your C-PAP to the hospital with you in case you may have to spend the night.   Notify your doctor if there is  any change in your medical condition (cold, fever, infection).  Wear comfortable clothing (specific to your surgery type) to the hospital.  After surgery, you can help prevent lung complications by doing breathing exercises.  Take deep breaths and cough every 1-2 hours. Your doctor may order a device called an Incentive Spirometer to help you take deep breaths. When coughing or sneezing, hold a pillow firmly against your incision with both hands. This  is called "splinting." Doing this helps protect your incision. It also decreases belly discomfort.  If you are being admitted to the hospital overnight, leave your suitcase in the car. After surgery it may be brought to your room.  If you are being discharged the day of surgery, you will not be allowed to drive home. You will need a responsible adult (18 years or older) to drive you home and stay with you that night.   If you are taking public transportation, you will need to have a responsible adult (18 years or older) with you. Please confirm with your physician that it is acceptable to use public transportation.   Please call the Pre-admissions Testing Dept. at [phone] if you have any questions about these instructions.  Surgery Visitation Policy:  Patients undergoing a surgery or procedure may have one family member or support person with them as long as that person is not COVID-19 positive or experiencing its symptoms.  That person may remain in the waiting area during the procedure.  Inpatient Visitation:    Visiting hours are 7 a.m. to 8 p.m. Inpatients will be allowed two visitors daily. The visitors may change each day during the patient's stay. No visitors under the age of 72. Any visitor under the age of 5 must be accompanied by an adult. The visitor must pass COVID-19 screenings, use hand sanitizer when entering and exiting the patient's room and wear a mask at all times, including in the patient's room. Patients must also wear a mask when staff or their visitor are in the room. Masking is required regardless of vaccination status.

## 2020-10-31 NOTE — Pre-Procedure Instructions (Signed)
Jo Soto at office notified that pt wants to reschedule PAT appt and reschedule her surgery to sometime in Sept.

## 2020-11-01 ENCOUNTER — Telehealth: Payer: Self-pay | Admitting: Surgery

## 2020-11-01 NOTE — Telephone Encounter (Signed)
Updated information regarding rescheduled surgery at patient's request.    Patient has been advised of Pre-Admission date/time, COVID Testing date and Surgery date.  Surgery Date: 12/12/20 Preadmission Testing Date: 12/02/20 (phone 8a-1p) Covid Testing Date: Not needed.     Patient has been made aware to call [phone], between 1-3:00pm the day before surgery, to find out what time to arrive for surgery.

## 2020-11-04 ENCOUNTER — Ambulatory Visit: Payer: BC Managed Care – PPO | Admitting: Surgery

## 2020-11-04 NOTE — Telephone Encounter (Signed)
Opened in error

## 2020-11-11 ENCOUNTER — Other Ambulatory Visit: Payer: Self-pay | Admitting: Internal Medicine

## 2020-11-29 ENCOUNTER — Telehealth: Payer: Self-pay | Admitting: Surgery

## 2020-11-29 NOTE — Telephone Encounter (Signed)
Incoming call from patient.  She thinks she has Covid, but has not been tested.  She wants to cancel her surgery again.  So surgery for 12/12/20 cancelled at patient's request.  When rescheduling her follow up with the doctor, she initially wanted to wait a month, but then decided to wait until November.  She is rescheduled for follow up on 01/27/21 at her request, will reschedule surgery if she decides to move forward after that visit.  Patient has cancelled and rescheduled multiple times.

## 2020-12-02 ENCOUNTER — Ambulatory Visit: Payer: BC Managed Care – PPO | Admitting: Surgery

## 2020-12-02 ENCOUNTER — Inpatient Hospital Stay: Admission: RE | Admit: 2020-12-02 | Payer: BC Managed Care – PPO | Source: Ambulatory Visit

## 2020-12-12 ENCOUNTER — Encounter: Admission: RE | Payer: Self-pay | Source: Home / Self Care

## 2020-12-12 ENCOUNTER — Ambulatory Visit: Admission: RE | Admit: 2020-12-12 | Payer: BC Managed Care – PPO | Source: Home / Self Care | Admitting: Surgery

## 2020-12-12 SURGERY — REPAIR, HERNIA, VENTRAL, ROBOT-ASSISTED
Anesthesia: General

## 2021-01-27 ENCOUNTER — Other Ambulatory Visit: Payer: Self-pay

## 2021-01-27 ENCOUNTER — Encounter: Payer: Self-pay | Admitting: Surgery

## 2021-01-27 ENCOUNTER — Ambulatory Visit (INDEPENDENT_AMBULATORY_CARE_PROVIDER_SITE_OTHER): Payer: BC Managed Care – PPO | Admitting: Surgery

## 2021-01-27 VITALS — BP 132/101 | HR 96 | Temp 98.9°F | Ht 63.0 in | Wt 231.0 lb

## 2021-01-27 DIAGNOSIS — K432 Incisional hernia without obstruction or gangrene: Secondary | ICD-10-CM | POA: Diagnosis not present

## 2021-01-27 NOTE — Progress Notes (Signed)
01/27/2021  History of Present Illness: Jo Soto is a 40 y.o. female following up for an incisional umbilical hernia.  She had been recently scheduled for robotic incisional hernia repair on 12/12/20 but the patient canceled surgery.  She presents today for H&P update and to schedule surgery.  She reports that she's been doing well, but does feel that the umbilical area is more sore at times.  Denies any larger bulging, only endorses more discomfort.  Denies any fevers, chills, chest pain, shortness of breath, nausea, vomiting.  Past Medical History: Past Medical History:  Diagnosis Date   Allergy    Anxiety    Depression    GERD (gastroesophageal reflux disease)    Heartburn    Migraines      Past Surgical History: Past Surgical History:  Procedure Laterality Date   ANTERIOR CRUCIATE LIGAMENT REPAIR  12/2017   ESOPHAGOGASTRODUODENOSCOPY (EGD) WITH PROPOFOL N/A 05/06/2020   Procedure: ESOPHAGOGASTRODUODENOSCOPY (EGD) WITH PROPOFOL;  Surgeon: Wyline Mood, MD;  Location: [facility] ENDOSCOPY;  Service: Gastroenterology;  Laterality: N/A;  COVID POSITIVE 04/03/2020   ROBOTIC ASSISTED LAPAROSCOPIC CHOLECYSTECTOMY  07/02/2020   TUMOR REMOVAL  12/2014   Abdominal    Home Medications: Prior to Admission medications   Medication Sig Start Date End Date Taking? Authorizing Provider  buPROPion (WELLBUTRIN XL) 300 MG 24 hr tablet Take 1 tablet (300 mg total) by mouth daily. 06/03/20   Flinchum, Eula Fried, FNP  diclofenac Sodium (VOLTAREN) 1 % GEL APPLY 2 GRAMS TO AFFECTED AREA 4 TIMES A DAY 11/11/20   Vigg, Avanti, MD  Levonorgestrel (MIRENA, 52 MG, IU) Mirena 20 mcg/24 hours (7 yrs) 52 mg intrauterine device  Take by intrauterine route.    [provider]  meloxicam (MOBIC) 15 MG tablet Take 15 mg by mouth daily. 10/21/20   [provider]  Multiple Vitamin (MULTIVITAMIN WITH MINERALS) TABS tablet Take 1 tablet by mouth in the morning.    [provider]  Multiple  Vitamins-Minerals (HAIR/SKIN/NAILS) TABS Take 1 tablet by mouth daily.    [provider]  omeprazole (PRILOSEC) 40 MG capsule Take 1 capsule (40 mg total) by mouth in the morning and at bedtime. 10/28/20   Chrismon, Jodell Cipro, PA-C  oxyCODONE (ROXICODONE) 5 MG immediate release tablet Take 1 tablet (5 mg total) by mouth every 6 (six) hours as needed for severe pain. Patient not taking: Reported on 10/30/2020 09/11/20   Henrene Dodge, MD  Vitamin D, Ergocalciferol, (DRISDOL) 1.25 MG (50000 UNIT) CAPS capsule Take 1 capsule (50,000 Units total) by mouth every 7 (seven) days. (taking one tablet per week) walk in lab in office 1-2 weeks after completing prescription. 06/05/20   Flinchum, Eula Fried, FNP    Allergies: No Known Allergies  Review of Systems: Review of Systems  Constitutional:  Negative for chills and fever.  HENT:  Negative for hearing loss.   Respiratory:  Negative for shortness of breath.   Cardiovascular:  Negative for chest pain.  Gastrointestinal:  Positive for abdominal pain. Negative for constipation, diarrhea, nausea and vomiting.  Genitourinary:  Negative for dysuria.  Musculoskeletal:  Negative for myalgias.  Skin:  Negative for rash.  Neurological:  Negative for dizziness.  Psychiatric/Behavioral:  Negative for depression.    Physical Exam BP (!) 132/101   Pulse 96   Temp 98.9 F (37.2 C)   Ht 5\' 3"  (1.6 m)   Wt 231 lb (104.8 kg)   BMI 40.92 kg/m  CONSTITUTIONAL: No acute distress, well nourished. HEENT:  Normocephalic,  atraumatic, extraocular motion intact. NECK:  Trachea is midline, no jugular venous distention RESPIRATORY:  Lungs are clear, and breath sounds are equal bilaterally. Normal respiratory effort without pathologic use of accessory muscles. CARDIOVASCULAR: Heart is regular without murmurs, gallops, or rubs. GI: The abdomen is soft, non-distended, with some tenderness at the umbilicus. The patient has a reducible umbilical incisional hernia at  the site of prior port site.  Her other incisions are otherwise well healed.   MUSCULOSKELETAL:  normal gait, no peripheral edema. NEUROLOGIC:  Motor and sensation is grossly normal.  Cranial nerves are grossly intact. PSYCH:  Alert and oriented to person, place and time. Affect is normal.  Labs/Imaging: CT scan 08/21/20: IMPRESSION: Small umbilical hernia containing fat.   Stranding at the periumbilical soft tissues consistent with recent surgery, though cannot exclude cellulitis by imaging.   No acute intra-abdominal or intrapelvic abnormalities.  Assessment and Plan: This is a 40 y.o. female with incisional hernia at prior umbilical port site.  --Discussed with the patient that we can continue our original plan for a robotic assisted incisional hernia repair with mesh.  Reviewed the surgery at length with the patient and had also previously discussed risks of bleeding, infection, injury to surrounding structures, that this is an outpatient procedure, that we can provide an abdominal binder, post-op recovery and activity restrictions, and she's willing to proceed. --Based on her schedule, will schedule the patient for 02/20/21.  All questions have been answered.  I spent 40 minutes dedicated to the care of this patient on the date of this encounter to include pre-visit review of records, face-to-face time with the patient discussing diagnosis and management, and any post-visit coordination of care.   Howie Ill, MD [facility]

## 2021-01-27 NOTE — Patient Instructions (Signed)
You have requested to have a Ventral Hernia Repair. This will be done on 02/20/21 by Dr Jo Soto at [facility]. Please see your (BLUE) Pre-care sheet for more information.  Our surgery scheduler will call you to review surgery date and to go over information.   You will need to arrange to be out of work for approximately 1-2 weeks and then you may return with a lifting restriction for 4 more weeks. If you have FMLA or Disability paperwork that needs to be filled out, please have your company fax your paperwork to [phone] or you may drop this by either office.   Ventral Hernia A ventral hernia (also called an incisional hernia) is a hernia that occurs at the site of a previous surgical cut (incision) in the abdomen. The abdominal wall spans from your lower chest down to your pelvis. If the abdominal wall is weakened from a surgical incision, a hernia can occur. A hernia is a bulge of bowel or muscle tissue pushing out on the weakened part of the abdominal wall. Ventral hernias can get bigger from straining or lifting. Obese and older people are at higher risk for a ventral hernia. People who develop infections after surgery or require repeat incisions at the same site on the abdomen are also at increased risk. CAUSES  A ventral hernia occurs because of weakness in the abdominal wall at an incision site.  SYMPTOMS  Common symptoms include: A visible bulge or lump on the abdominal wall. Pain or tenderness around the lump. Increased discomfort if you cough or make a sudden movement. If the hernia has blocked part of the intestine, a serious complication can occur (incarcerated or strangulated hernia). This can become a problem that requires emergency surgery because the blood flow to the blocked intestine may be cut off. Symptoms may include: Feeling sick to your stomach (nauseous). Throwing up (vomiting). Stomach swelling (distention) or bloating. Fever. Rapid heartbeat. DIAGNOSIS  Your health care  provider will take a medical history and perform a physical exam. Various tests may be ordered, such as: Blood tests. Urine tests. Ultrasonography. X-rays. Computed tomography (CT). TREATMENT  Watchful waiting may be all that is needed for a smaller hernia that does not cause symptoms. Your health care provider may recommend the use of a supportive belt (truss) that helps to keep the abdominal wall intact. For larger hernias or those that cause pain, surgery to repair the hernia is usually recommended. If a hernia becomes strangulated, emergency surgery needs to be done right away. HOME CARE INSTRUCTIONS Avoid putting pressure or strain on the abdominal area. Avoid heavy lifting. Use good body positioning for physical tasks. Ask your health care provider about proper body positioning. Use a supportive belt as directed by your health care provider. Maintain a healthy weight. Eat foods that are high in fiber, such as whole grains, fruits, and vegetables. Fiber helps prevent difficult bowel movements (constipation). Drink enough fluids to keep your urine clear or pale yellow. Follow up with your health care provider as directed. SEEK MEDICAL CARE IF:  Your hernia seems to be getting larger or more painful. SEEK IMMEDIATE MEDICAL CARE IF:  You have abdominal pain that is sudden and sharp. Your pain becomes severe. You have repeated vomiting. You are sweating a lot. You notice a rapid heartbeat. You develop a fever. MAKE SURE YOU:  Understand these instructions. Will watch your condition. Will get help right away if you are not doing well or get worse.   This information is  not intended to replace advice given to you by your health care provider. Make sure you discuss any questions you have with your health care provider.   Document Released: 02/24/2012 Document Revised: 03/30/2014 Document Reviewed: 02/24/2012 Elsevier Interactive Patient Education 2016 Elsevier  Inc.    Laparoscopic Ventral Hernia Repair Laparoscopic ventral hernia repair is a surgery to fix a ventral hernia. A ventral hernia, also called an incisional hernia, is a bulge of body tissue or intestines that pushes through the front part of the abdomen. This can happen if the connective tissue covering the muscles over the abdomen has a weak spot or is torn because of a surgical cut (incision) from a previous surgery. Laparoscopic ventral hernia repair is often done soon after diagnosis to stop the hernia from getting bigger, becoming uncomfortable, or becoming an emergency. This surgery usually takes about 2 hours, but the time can vary greatly. LET [facility] CARE PROVIDER KNOW ABOUT: Any allergies you have. All medicines you are taking, including steroids, vitamins, herbs, eye drops, creams, and over-the-counter medicines. Previous problems you or members of your family have had with the use of anesthetics. Any blood disorders you have. Previous surgeries you have had. Medical conditions you have. RISKS AND COMPLICATIONS  Generally, laparoscopic ventral hernia repair is a safe procedure. However, as with any surgical procedure, problems can occur. Possible problems include: Bleeding. Trouble passing urine or having a bowel movement after the surgery. Infection. Pneumonia. Blood clots. Pain in the area of the hernia. A bulge in the area of the hernia that may be caused by a collection of fluid. Injury to intestines or other structures in the abdomen. Return of the hernia after surgery. In some cases, your health care provider may need to stop the laparoscopic procedure and do regular, open surgery. This may be necessary for very difficult hernias, when organs are hard to see, or when bleeding problems occur during surgery. BEFORE THE PROCEDURE  You may need to have blood tests, urine tests, a chest X-ray, or an electrocardiogram done before the day of the surgery. Ask your health  care provider about changing or stopping your regular medicines. This is especially important if you are taking diabetes medicines or blood thinners. You may need to wash with a special type of germ-killing soap. Do not eat or drink anything after midnight the night before the procedure or as directed by your health care provider. Make plans to have someone drive you home after the procedure. PROCEDURE  Small monitors will be put on your body. They are used to check your heart, blood pressure, and oxygen level. An IV access tube will be put into a vein in your hand or arm. Fluids and medicine will flow directly into your body through the IV tube. You will be given medicine that makes you go to sleep (general anesthetic). Your abdomen will be cleaned with a special soap to kill any germs on your skin. Once you are asleep, several small incisions will be made in your abdomen. The large space in your abdomen will be filled with air so that it expands. This gives your health care provider more room and a better view. A thin, lighted tube with a tiny camera on the end (laparoscope) is put through a small incision in your abdomen. The camera on the laparoscope sends a picture to a TV screen in the operating room. This gives your health care provider a good view inside your abdomen. Hollow tubes are put through the  other small incisions in your abdomen. The tools needed for the procedure are put through these tubes. Your health care provider puts the tissue or intestines that formed the hernia back in place. A screen-like patch (mesh) is used to close the hernia. This helps make the area stronger. Stitches, tacks, or staples are used to keep the mesh in place. Medicine and a bandage (dressing) or skin glue will be put over the incisions. AFTER THE PROCEDURE  You will stay in a recovery area until the anesthetic wears off. Your blood pressure and pulse will be checked often. You may be able to go home the  same day or may need to stay in the hospital for 1-2 days after surgery. Your health care provider will decide when you can go home. You may feel some pain. You may be given medicine for pain. You will be urged to do breathing exercises that involve taking deep breaths. This helps prevent a lung infection after a surgery. You may have to wear compression stockings while you are in the hospital. These stockings help keep blood clots from forming in your legs.   This information is not intended to replace advice given to you by your health care provider. Make sure you discuss any questions you have with your health care provider.   Document Released: 02/24/2012 Document Revised: 03/14/2013 Document Reviewed: 02/24/2012 Elsevier Interactive Patient Education [employer]

## 2021-01-28 ENCOUNTER — Telehealth: Payer: Self-pay | Admitting: Surgery

## 2021-01-28 NOTE — Telephone Encounter (Signed)
Patient has been advised of Pre-Admission date/time, COVID Testing date and Surgery date.  Surgery Date: 02/20/21 Preadmission Testing Date: 02/17/21 (phone 8a-1p) Covid Testing Date: Not needed.     Patient has been made aware to call [phone], between 1-3:00pm the day before surgery, to find out what time to arrive for surgery.

## 2021-02-06 ENCOUNTER — Telehealth: Payer: Self-pay | Admitting: Adult Health

## 2021-02-06 MED ORDER — BUPROPION HCL ER (XL) 300 MG PO TB24: 300.0000 mg | ORAL_TABLET | Freq: Every day | ORAL | 0 refills | Status: DC

## 2021-02-06 NOTE — Telephone Encounter (Signed)
[facility] faxed refill request for the following medications:  buPROPion (WELLBUTRIN XL) 300 MG 24 hr tablet    Please advise.  

## 2021-02-17 ENCOUNTER — Encounter
Admission: RE | Admit: 2021-02-17 | Discharge: 2021-02-17 | Disposition: A | Discharge status: Home / Self Care | Payer: BC Managed Care – PPO | Source: Ambulatory Visit | Attending: Surgery | Admitting: Surgery

## 2021-02-17 ENCOUNTER — Other Ambulatory Visit: Payer: Self-pay

## 2021-02-17 HISTORY — DX: Sleep apnea, unspecified: G47.30

## 2021-02-17 NOTE — Patient Instructions (Addendum)
Your procedure is scheduled on: Thursday 02/20/21 Report to the Registration Desk on the 1st floor of the Medical Mall. To find out your arrival time, please call [phone] between 1PM - 3PM on: Wednesday 02/19/21  REMEMBER: Instructions that are not followed completely may result in serious medical risk, up to and including death; or upon the discretion of your surgeon and anesthesiologist your surgery may need to be rescheduled.  Do not eat food after midnight the night before surgery.  No gum chewing, lozengers or hard candies.  You may however, drink CLEAR liquids up to 2 hours before you are scheduled to arrive for your surgery. Do not drink anything within 2 hours of your scheduled arrival time.  Clear liquids include: - water  - apple juice without pulp - gatorade (not RED, PURPLE, OR BLUE) - black coffee or tea (Do NOT add milk or creamers to the coffee or tea) Do NOT drink anything that is not on this list.  TAKE THESE MEDICATIONS THE MORNING OF SURGERY WITH A SIP OF WATER: buPROPion (WELLBUTRIN XL) 300 MG 24 hr tablet  omeprazole (PRILOSEC) 40 MG capsule (take one the night before and one on the morning of surgery - helps to prevent nausea after surgery.)  One week prior to surgery: Stop taking your meloxicam (MOBIC) 15 MG tablet and any Anti-inflammatories (NSAIDS) such as Advil, Aleve, Ibuprofen, Motrin, Naproxen, Naprosyn and Aspirin based products such as Excedrin, Goodys Powder, BC Powder. Stop taking your Multiple Vitamin (MULTIVITAMIN WITH MINERALS) TABS tablet, Multiple Vitamins-Minerals (HAIR/SKIN/NAILS) TABS and ANY OVER THE COUNTER supplements until after surgery. You may however, continue to take Tylenol if needed for pain up until the day of surgery.  No Alcohol for 24 hours before or after surgery.  No Smoking including e-cigarettes for 24 hours prior to surgery.  No chewable tobacco products for at least 6 hours prior to surgery.  No nicotine patches on  the day of surgery.  Do not use any "recreational" drugs for at least a week prior to your surgery.  Please be advised that the combination of cocaine and anesthesia may have negative outcomes, up to and including death. If you test positive for cocaine, your surgery will be cancelled.  On the morning of surgery brush your teeth with toothpaste and water, you may rinse your mouth with mouthwash if you wish. Do not swallow any toothpaste or mouthwash.  Use antibacterial soap the night prior to surgery and the morning of surgery.  Do not wear jewelry, make-up, hairpins, clips or nail polish.  Do not wear lotions, powders, or perfumes.   Do not shave body from the neck down 48 hours prior to surgery just in case you cut yourself which could leave a site for infection.   Do not bring valuables to the hospital. [facility] is not responsible for any missing/lost belongings or valuables.   Notify your doctor if there is any change in your medical condition (cold, fever, infection).  Wear comfortable clothing (specific to your surgery type) to the hospital.  After surgery, you can help prevent lung complications by doing breathing exercises.  Take deep breaths and cough every 1-2 hours.  When coughing or sneezing, hold a pillow firmly against your incision with both hands. This is called "splinting." Doing this helps protect your incision. It also decreases belly discomfort.  If you are being discharged the day of surgery, you will not be allowed to drive home. You will need a responsible adult (18 years  or older) to drive you home and stay with you that night.   If you are taking public transportation, you will need to have a responsible adult (18 years or older) with you. Please confirm with your physician that it is acceptable to use public transportation.   Please call the Pre-admissions Testing Dept. at [phone] if you have any questions about these instructions.  Surgery  Visitation Policy:  Patients undergoing a surgery or procedure may have one family member or support person with them as long as that person is not COVID-19 positive or experiencing its symptoms.  That person may remain in the waiting area during the procedure and may rotate out with other people.  Inpatient Visitation:    Visiting hours are 7 a.m. to 8 p.m. Up to two visitors ages 16+ are allowed at one time in a patient room. The visitors may rotate out with other people during the day. Visitors must check out when they leave, or other visitors will not be allowed. One designated support person may remain overnight. The visitor must pass COVID-19 screenings, use hand sanitizer when entering and exiting the patient's room and wear a mask at all times, including in the patient's room. Patients must also wear a mask when staff or their visitor are in the room. Masking is required regardless of vaccination status.

## 2021-02-18 ENCOUNTER — Telehealth: Payer: Self-pay | Admitting: *Deleted

## 2021-02-18 NOTE — Telephone Encounter (Signed)
Faxed FMLA/STD to [employer] at [phone]

## 2021-02-19 ENCOUNTER — Telehealth: Payer: Self-pay | Admitting: Surgery

## 2021-02-19 NOTE — Telephone Encounter (Signed)
Updated information regarding rescheduled surgery at patient's request.    Patient has been advised of Pre-Admission date/time, COVID Testing date and Surgery date.  Surgery Date: 03/11/21 Preadmission Testing Date: 02/17/21, already done.    Covid Testing Date: Not needed.     Patient has been made aware to call [phone], between 1-3:00pm the day before surgery, to find out what time to arrive for surgery.

## 2021-02-20 ENCOUNTER — Encounter
Admission: RE | Disposition: A | Discharge status: Home / Self Care | Payer: Self-pay | Source: Ambulatory Visit | Attending: Surgery

## 2021-02-20 SURGERY — REPAIR, HERNIA, VENTRAL, ROBOT-ASSISTED
Anesthesia: General

## 2021-03-11 ENCOUNTER — Ambulatory Visit
Admission: RE | Admit: 2021-03-11 | Discharge: 2021-03-11 | Disposition: A | Discharge status: Home / Self Care | Payer: BC Managed Care – PPO | Source: Ambulatory Visit | Attending: Surgery | Admitting: Surgery

## 2021-03-11 ENCOUNTER — Encounter: Payer: Self-pay | Admitting: Surgery

## 2021-03-11 ENCOUNTER — Ambulatory Visit: Payer: BC Managed Care – PPO | Admitting: Certified Registered"

## 2021-03-11 ENCOUNTER — Other Ambulatory Visit: Payer: Self-pay

## 2021-03-11 ENCOUNTER — Encounter
Admission: RE | Disposition: A | Discharge status: Home / Self Care | Payer: Self-pay | Source: Ambulatory Visit | Attending: Surgery

## 2021-03-11 DIAGNOSIS — Z9049 Acquired absence of other specified parts of digestive tract: Secondary | ICD-10-CM | POA: Insufficient documentation

## 2021-03-11 DIAGNOSIS — Z8616 Personal history of COVID-19: Secondary | ICD-10-CM | POA: Insufficient documentation

## 2021-03-11 DIAGNOSIS — K219 Gastro-esophageal reflux disease without esophagitis: Secondary | ICD-10-CM | POA: Insufficient documentation

## 2021-03-11 DIAGNOSIS — F32A Depression, unspecified: Secondary | ICD-10-CM | POA: Insufficient documentation

## 2021-03-11 DIAGNOSIS — F419 Anxiety disorder, unspecified: Secondary | ICD-10-CM | POA: Insufficient documentation

## 2021-03-11 DIAGNOSIS — K432 Incisional hernia without obstruction or gangrene: Secondary | ICD-10-CM

## 2021-03-11 DIAGNOSIS — G4730 Sleep apnea, unspecified: Secondary | ICD-10-CM | POA: Insufficient documentation

## 2021-03-11 DIAGNOSIS — K429 Umbilical hernia without obstruction or gangrene: Secondary | ICD-10-CM | POA: Diagnosis not present

## 2021-03-11 DIAGNOSIS — J45909 Unspecified asthma, uncomplicated: Secondary | ICD-10-CM | POA: Diagnosis not present

## 2021-03-11 DIAGNOSIS — F17200 Nicotine dependence, unspecified, uncomplicated: Secondary | ICD-10-CM | POA: Diagnosis not present

## 2021-03-11 HISTORY — PX: XI ROBOTIC ASSISTED VENTRAL HERNIA: SHX6789

## 2021-03-11 LAB — POCT PREGNANCY, URINE: Preg Test, Ur: NEGATIVE

## 2021-03-11 SURGERY — REPAIR, HERNIA, VENTRAL, ROBOT-ASSISTED
Anesthesia: General | Site: Abdomen

## 2021-03-11 MED ORDER — CHLORHEXIDINE GLUCONATE 0.12 % MT SOLN
OROMUCOSAL | Status: AC
  Administered 2021-03-11: 07:00:00 15 mL via OROMUCOSAL
  Filled 2021-03-11: qty 15

## 2021-03-11 MED ORDER — PHENYLEPHRINE HCL-NACL 20-0.9 MG/250ML-% IV SOLN
INTRAVENOUS | Status: DC | PRN
  Administered 2021-03-11: 30 ug/min via INTRAVENOUS

## 2021-03-11 MED ORDER — ROCURONIUM BROMIDE 100 MG/10ML IV SOLN
INTRAVENOUS | Status: DC | PRN
  Administered 2021-03-11: 10 mg via INTRAVENOUS
  Administered 2021-03-11: 50 mg via INTRAVENOUS
  Administered 2021-03-11: 20 mg via INTRAVENOUS
  Administered 2021-03-11: 10 mg via INTRAVENOUS
  Administered 2021-03-11: 20 mg via INTRAVENOUS

## 2021-03-11 MED ORDER — ACETAMINOPHEN 500 MG PO TABS
ORAL_TABLET | ORAL | Status: AC
  Administered 2021-03-11: 07:00:00 1000 mg via ORAL
  Filled 2021-03-11: qty 1

## 2021-03-11 MED ORDER — CHLORHEXIDINE GLUCONATE CLOTH 2 % EX PADS
6.0000 | MEDICATED_PAD | Freq: Once | CUTANEOUS | Status: AC
  Administered 2021-03-11: 07:00:00 6 via TOPICAL

## 2021-03-11 MED ORDER — MIDAZOLAM HCL 2 MG/2ML IJ SOLN
INTRAMUSCULAR | Status: AC
  Filled 2021-03-11: qty 2

## 2021-03-11 MED ORDER — HYDROMORPHONE HCL 1 MG/ML IJ SOLN
INTRAMUSCULAR | Status: AC
  Filled 2021-03-11: qty 1

## 2021-03-11 MED ORDER — OXYCODONE HCL 5 MG PO TABS
5.0000 mg | ORAL_TABLET | Freq: Once | ORAL | Status: AC
  Administered 2021-03-11: 11:00:00 5 mg via ORAL

## 2021-03-11 MED ORDER — ORAL CARE MOUTH RINSE: 15.0000 mL | Freq: Once | OROMUCOSAL | Status: AC

## 2021-03-11 MED ORDER — GLYCOPYRROLATE 0.2 MG/ML IJ SOLN
INTRAMUSCULAR | Status: DC | PRN
  Administered 2021-03-11: .1 mg via INTRAVENOUS

## 2021-03-11 MED ORDER — OXYCODONE HCL 5 MG PO TABS
ORAL_TABLET | ORAL | Status: AC
  Filled 2021-03-11: qty 1

## 2021-03-11 MED ORDER — DEXAMETHASONE SODIUM PHOSPHATE 10 MG/ML IJ SOLN
INTRAMUSCULAR | Status: DC | PRN
  Administered 2021-03-11: 10 mg via INTRAVENOUS

## 2021-03-11 MED ORDER — HYDROMORPHONE HCL 1 MG/ML IJ SOLN
INTRAMUSCULAR | Status: DC | PRN
  Administered 2021-03-11 (×2): .5 mg via INTRAVENOUS

## 2021-03-11 MED ORDER — ONDANSETRON HCL 4 MG/2ML IJ SOLN
INTRAMUSCULAR | Status: AC
  Filled 2021-03-11: qty 2

## 2021-03-11 MED ORDER — CEFAZOLIN SODIUM-DEXTROSE 2-4 GM/100ML-% IV SOLN
INTRAVENOUS | Status: AC
  Filled 2021-03-11: qty 100

## 2021-03-11 MED ORDER — OXYCODONE HCL 5 MG PO TABS: 5.0000 mg | ORAL_TABLET | ORAL | 0 refills | Status: DC | PRN

## 2021-03-11 MED ORDER — SUGAMMADEX SODIUM 200 MG/2ML IV SOLN
INTRAVENOUS | Status: DC | PRN
  Administered 2021-03-11: 200 mg via INTRAVENOUS

## 2021-03-11 MED ORDER — CEFAZOLIN SODIUM-DEXTROSE 2-4 GM/100ML-% IV SOLN
2.0000 g | INTRAVENOUS | Status: AC
  Administered 2021-03-11: 08:00:00 2 g via INTRAVENOUS

## 2021-03-11 MED ORDER — LACTATED RINGERS IV SOLN: INTRAVENOUS | Status: DC

## 2021-03-11 MED ORDER — DEXMEDETOMIDINE HCL IN NACL 200 MCG/50ML IV SOLN
INTRAVENOUS | Status: DC | PRN
  Administered 2021-03-11: 12 ug via INTRAVENOUS
  Administered 2021-03-11: 8 ug via INTRAVENOUS

## 2021-03-11 MED ORDER — KETAMINE HCL 10 MG/ML IJ SOLN
INTRAMUSCULAR | Status: DC | PRN
  Administered 2021-03-11: 30 mg via INTRAVENOUS
  Administered 2021-03-11: 20 mg via INTRAVENOUS

## 2021-03-11 MED ORDER — BUPIVACAINE LIPOSOME 1.3 % IJ SUSP: 20.0000 mL | Freq: Once | INTRAMUSCULAR | Status: DC

## 2021-03-11 MED ORDER — ONDANSETRON HCL 4 MG/2ML IJ SOLN: 4.0000 mg | Freq: Once | INTRAMUSCULAR | Status: DC | PRN

## 2021-03-11 MED ORDER — ACETAMINOPHEN 500 MG PO TABS: 1000.0000 mg | ORAL_TABLET | ORAL | Status: AC

## 2021-03-11 MED ORDER — FENTANYL CITRATE (PF) 100 MCG/2ML IJ SOLN
INTRAMUSCULAR | Status: AC
  Administered 2021-03-11: 11:00:00 25 ug via INTRAVENOUS
  Filled 2021-03-11: qty 2

## 2021-03-11 MED ORDER — MIDAZOLAM HCL 2 MG/2ML IJ SOLN
INTRAMUSCULAR | Status: DC | PRN
  Administered 2021-03-11: 2 mg via INTRAVENOUS

## 2021-03-11 MED ORDER — KETAMINE HCL 50 MG/5ML IJ SOSY
PREFILLED_SYRINGE | INTRAMUSCULAR | Status: AC
  Filled 2021-03-11: qty 5

## 2021-03-11 MED ORDER — PROPOFOL 10 MG/ML IV BOLUS
INTRAVENOUS | Status: DC | PRN
  Administered 2021-03-11: 200 mg via INTRAVENOUS

## 2021-03-11 MED ORDER — ONDANSETRON HCL 4 MG/2ML IJ SOLN
INTRAMUSCULAR | Status: DC | PRN
  Administered 2021-03-11: 4 mg via INTRAVENOUS

## 2021-03-11 MED ORDER — DEXAMETHASONE SODIUM PHOSPHATE 10 MG/ML IJ SOLN
INTRAMUSCULAR | Status: AC
  Filled 2021-03-11: qty 1

## 2021-03-11 MED ORDER — BUPIVACAINE-EPINEPHRINE 0.25% -1:200000 IJ SOLN
INTRAMUSCULAR | Status: DC | PRN
  Administered 2021-03-11: 09:00:00 50 mL via SURGICAL_CAVITY

## 2021-03-11 MED ORDER — PHENYLEPHRINE HCL (PRESSORS) 10 MG/ML IV SOLN
INTRAVENOUS | Status: DC | PRN
  Administered 2021-03-11: 160 ug via INTRAVENOUS
  Administered 2021-03-11: 80 ug via INTRAVENOUS
  Administered 2021-03-11: 240 ug via INTRAVENOUS

## 2021-03-11 MED ORDER — LIDOCAINE HCL (CARDIAC) PF 100 MG/5ML IV SOSY
PREFILLED_SYRINGE | INTRAVENOUS | Status: DC | PRN
  Administered 2021-03-11: 60 mg via INTRAVENOUS

## 2021-03-11 MED ORDER — FENTANYL CITRATE (PF) 100 MCG/2ML IJ SOLN
25.0000 ug | INTRAMUSCULAR | Status: DC | PRN
  Administered 2021-03-11: 11:00:00 25 ug via INTRAVENOUS

## 2021-03-11 MED ORDER — PHENYLEPHRINE HCL (PRESSORS) 10 MG/ML IV SOLN
INTRAVENOUS | Status: AC
  Filled 2021-03-11: qty 1

## 2021-03-11 MED ORDER — BUPIVACAINE LIPOSOME 1.3 % IJ SUSP
INTRAMUSCULAR | Status: AC
  Filled 2021-03-11: qty 20

## 2021-03-11 MED ORDER — ACETAMINOPHEN 500 MG PO TABS: 1000.0000 mg | ORAL_TABLET | Freq: Four times a day (QID) | ORAL | Status: DC | PRN

## 2021-03-11 MED ORDER — GABAPENTIN 300 MG PO CAPS: 300.0000 mg | ORAL_CAPSULE | ORAL | Status: AC

## 2021-03-11 MED ORDER — EPHEDRINE SULFATE 50 MG/ML IJ SOLN
INTRAMUSCULAR | Status: DC | PRN
  Administered 2021-03-11: 15 mg via INTRAVENOUS

## 2021-03-11 MED ORDER — GABAPENTIN 300 MG PO CAPS
ORAL_CAPSULE | ORAL | Status: AC
  Administered 2021-03-11: 07:00:00 300 mg via ORAL
  Filled 2021-03-11: qty 1

## 2021-03-11 MED ORDER — BUPIVACAINE-EPINEPHRINE (PF) 0.25% -1:200000 IJ SOLN
INTRAMUSCULAR | Status: AC
  Filled 2021-03-11: qty 30

## 2021-03-11 MED ORDER — PROPOFOL 10 MG/ML IV BOLUS
INTRAVENOUS | Status: AC
  Filled 2021-03-11: qty 20

## 2021-03-11 MED ORDER — CHLORHEXIDINE GLUCONATE 0.12 % MT SOLN: 15.0000 mL | Freq: Once | OROMUCOSAL | Status: AC

## 2021-03-11 SURGICAL SUPPLY — 70 items
ADH SKN CLS APL DERMABOND .7 (GAUZE/BANDAGES/DRESSINGS) ×1
APL PRP STRL LF DISP 70% ISPRP (MISCELLANEOUS) ×1
BAG SPEC RTRVL LRG 6X4 10 (ENDOMECHANICALS) ×1
BINDER ABDOMINAL 12 ML 46-62 (SOFTGOODS) ×2 IMPLANT
BLADE SURG 15 STRL LF DISP TIS (BLADE) IMPLANT
BLADE SURG 15 STRL SS (BLADE) ×3
BLADE SURG SZ11 CARB STEEL (BLADE) ×3 IMPLANT
CANNULA REDUC XI 12-8 STAPL (CANNULA) ×1
CANNULA REDUC XI 12-8MM STAPL (CANNULA) ×1
CANNULA REDUCER 12-8 DVNC XI (CANNULA) ×1 IMPLANT
CHLORAPREP W/TINT 26 (MISCELLANEOUS) ×3 IMPLANT
COVER TIP SHEARS 8 DVNC (MISCELLANEOUS) ×1 IMPLANT
COVER TIP SHEARS 8MM DA VINCI (MISCELLANEOUS) ×2
DEFOGGER SCOPE WARMER CLEARIFY (MISCELLANEOUS) ×3 IMPLANT
DERMABOND ADVANCED (GAUZE/BANDAGES/DRESSINGS) ×2
DERMABOND ADVANCED .7 DNX12 (GAUZE/BANDAGES/DRESSINGS) ×1 IMPLANT
DRAPE ARM DVNC X/XI (DISPOSABLE) ×3 IMPLANT
DRAPE COLUMN DVNC XI (DISPOSABLE) ×1 IMPLANT
DRAPE DA VINCI XI ARM (DISPOSABLE) ×6
DRAPE DA VINCI XI COLUMN (DISPOSABLE) ×2
ELECT CAUTERY BLADE TIP 2.5 (TIP) ×3
ELECT REM PT RETURN 9FT ADLT (ELECTROSURGICAL) ×3
ELECTRODE CAUTERY BLDE TIP 2.5 (TIP) ×1 IMPLANT
ELECTRODE REM PT RTRN 9FT ADLT (ELECTROSURGICAL) ×1 IMPLANT
GAUZE 4X4 16PLY ~~LOC~~+RFID DBL (SPONGE) ×3 IMPLANT
GLOVE SURG SYN 7.0 (GLOVE) ×6 IMPLANT
GLOVE SURG SYN 7.0 PF PI (GLOVE) ×2 IMPLANT
GLOVE SURG SYN 7.5  E (GLOVE) ×4
GLOVE SURG SYN 7.5 E (GLOVE) ×2 IMPLANT
GLOVE SURG SYN 7.5 PF PI (GLOVE) ×2 IMPLANT
GOWN STRL REUS W/ TWL LRG LVL3 (GOWN DISPOSABLE) ×3 IMPLANT
GOWN STRL REUS W/TWL LRG LVL3 (GOWN DISPOSABLE) ×9
GRASPER SUT TROCAR 14GX15 (MISCELLANEOUS) ×3 IMPLANT
IRRIGATION STRYKERFLOW (MISCELLANEOUS) IMPLANT
IRRIGATOR STRYKERFLOW (MISCELLANEOUS)
IV NS 1000ML (IV SOLUTION)
IV NS 1000ML BAXH (IV SOLUTION) IMPLANT
KIT PINK PAD W/HEAD ARE REST (MISCELLANEOUS) ×3
KIT PINK PAD W/HEAD ARM REST (MISCELLANEOUS) ×1 IMPLANT
LABEL OR SOLS (LABEL) ×3 IMPLANT
MANIFOLD NEPTUNE II (INSTRUMENTS) ×3 IMPLANT
MESH VENT LT ST 11.4CM CRL (Mesh General) ×2 IMPLANT
NDL INSUFFLATION 14GA 120MM (NEEDLE) ×1 IMPLANT
NEEDLE HYPO 22GX1.5 SAFETY (NEEDLE) ×3 IMPLANT
NEEDLE INSUFFLATION 14GA 120MM (NEEDLE) ×3 IMPLANT
OBTURATOR OPTICAL STANDARD 8MM (TROCAR) ×2
OBTURATOR OPTICAL STND 8 DVNC (TROCAR) ×1
OBTURATOR OPTICALSTD 8 DVNC (TROCAR) ×1 IMPLANT
PACK LAP CHOLECYSTECTOMY (MISCELLANEOUS) ×3 IMPLANT
PENCIL ELECTRO HAND CTR (MISCELLANEOUS) ×3 IMPLANT
POUCH SPECIMEN RETRIEVAL 10MM (ENDOMECHANICALS) ×2 IMPLANT
SEAL CANN UNIV 5-8 DVNC XI (MISCELLANEOUS) ×2 IMPLANT
SEAL XI 5MM-8MM UNIVERSAL (MISCELLANEOUS) ×4
SET TUBE SMOKE EVAC HIGH FLOW (TUBING) ×3 IMPLANT
SOLUTION ELECTROLUBE (MISCELLANEOUS) ×3 IMPLANT
SPONGE T-LAP 18X18 ~~LOC~~+RFID (SPONGE) ×3 IMPLANT
STAPLER CANNULA SEAL DVNC XI (STAPLE) ×1 IMPLANT
STAPLER CANNULA SEAL XI (STAPLE) ×2
SUT MNCRL 4-0 (SUTURE) ×3
SUT MNCRL 4-0 27XMFL (SUTURE) ×1
SUT STRATAFIX PDS 30 CT-1 (SUTURE) ×3 IMPLANT
SUT V-LOC 90 ABS DVC 3-0 CL (SUTURE) ×4 IMPLANT
SUT VIC AB 3-0 SH 27 (SUTURE) ×3
SUT VIC AB 3-0 SH 27X BRD (SUTURE) IMPLANT
SUT VICRYL 0 AB UR-6 (SUTURE) ×6 IMPLANT
SUT VLOC 90 2/L VL 12 GS22 (SUTURE) ×6 IMPLANT
SUTURE MNCRL 4-0 27XMF (SUTURE) ×1 IMPLANT
TRAY FOLEY SLVR 16FR LF STAT (SET/KITS/TRAYS/PACK) ×3 IMPLANT
WAND RF SURG SPNG DETECT SYS (INSTRUMENTS) ×3 IMPLANT
WATER STERILE IRR 500ML POUR (IV SOLUTION) ×3 IMPLANT

## 2021-03-11 NOTE — Transfer of Care (Signed)
Immediate Anesthesia Transfer of Care Note  Patient: Jo Soto  Procedure(s) Performed: XI ROBOTIC ASSISTED VENTRAL HERNIA, incisional hernia repair (Abdomen)  Patient Location: PACU  Anesthesia Type:MAC  Level of Consciousness: drowsy  Airway & Oxygen Therapy: Patient Spontanous Breathing  Post-op Assessment: Report given to RN  Post vital signs: stable  Last Vitals:  Vitals Value Taken Time  BP    Temp    Pulse    Resp    SpO2      Last Pain:  Vitals:   03/11/21 0628  TempSrc: Tympanic  PainSc: 0-No pain      Patients Stated Pain Goal: 0 (54/56/25 [id number])  Complications: No notable events documented.

## 2021-03-11 NOTE — H&P (Signed)
History of Present Illness: Jo Soto is a 40 y.o. female following up for an incisional umbilical hernia.  She had been recently scheduled for robotic incisional hernia repair on 12/12/20 but the patient canceled surgery and then again for 02/20/21.  She was rescheduled for 03/11/21.  She reports that she's been doing well, but does feel that the umbilical area is more sore at times.  Denies any larger bulging, only endorses more discomfort.  Denies any fevers, chills, chest pain, shortness of breath, nausea, vomiting.   Past Medical History:     Past Medical History:  Diagnosis Date   Allergy     Anxiety     Depression     GERD (gastroesophageal reflux disease)     Heartburn     Migraines        Past Surgical History:      Past Surgical History:  Procedure Laterality Date   ANTERIOR CRUCIATE LIGAMENT REPAIR   12/2017   ESOPHAGOGASTRODUODENOSCOPY (EGD) WITH PROPOFOL N/A 05/06/2020    Procedure: ESOPHAGOGASTRODUODENOSCOPY (EGD) WITH PROPOFOL;  Surgeon: Wyline Mood, MD;  Location: [facility] ENDOSCOPY;  Service: Gastroenterology;  Laterality: N/A;  COVID POSITIVE 04/03/2020   ROBOTIC ASSISTED LAPAROSCOPIC CHOLECYSTECTOMY   07/02/2020   TUMOR REMOVAL   12/2014    Abdominal      Home Medications:        Prior to Admission medications   Medication Sig Start Date End Date Taking? Authorizing Provider  buPROPion (WELLBUTRIN XL) 300 MG 24 hr tablet Take 1 tablet (300 mg total) by mouth daily. 06/03/20     Flinchum, Eula Fried, FNP  diclofenac Sodium (VOLTAREN) 1 % GEL APPLY 2 GRAMS TO AFFECTED AREA 4 TIMES A DAY 11/11/20     Vigg, Avanti, MD  Levonorgestrel (MIRENA, 52 MG, IU) Mirena 20 mcg/24 hours (7 yrs) 52 mg intrauterine device  Take by intrauterine route.       [provider]  meloxicam (MOBIC) 15 MG tablet Take 15 mg by mouth daily. 10/21/20     [provider]  Multiple Vitamin (MULTIVITAMIN WITH MINERALS) TABS tablet Take 1 tablet by mouth in the morning.        [provider]  Multiple Vitamins-Minerals (HAIR/SKIN/NAILS) TABS Take 1 tablet by mouth daily.       [provider]  omeprazole (PRILOSEC) 40 MG capsule Take 1 capsule (40 mg total) by mouth in the morning and at bedtime. 10/28/20     Chrismon, Jodell Cipro, PA-C  oxyCODONE (ROXICODONE) 5 MG immediate release tablet Take 1 tablet (5 mg total) by mouth every 6 (six) hours as needed for severe pain. Patient not taking: Reported on 10/30/2020 09/11/20     Henrene Dodge, MD  Vitamin D, Ergocalciferol, (DRISDOL) 1.25 MG (50000 UNIT) CAPS capsule Take 1 capsule (50,000 Units total) by mouth every 7 (seven) days. (taking one tablet per week) walk in lab in office 1-2 weeks after completing prescription. 06/05/20     Flinchum, Eula Fried, FNP      Allergies: No Known Allergies   Review of Systems: Review of Systems  Constitutional:  Negative for chills and fever.  HENT:  Negative for hearing loss.   Respiratory:  Negative for shortness of breath.   Cardiovascular:  Negative for chest pain.  Gastrointestinal:  Positive for abdominal pain. Negative for constipation, diarrhea, nausea and vomiting.  Genitourinary:  Negative for dysuria.  Musculoskeletal:  Negative for myalgias.  Skin:  Negative for rash.  Neurological:  Negative for dizziness.  Psychiatric/Behavioral:  Negative for depression.     Physical Exam BP (!) 132/101    Pulse 96    Temp 98.9 F (37.2 C)    Ht 5\' 3"  (1.6 m)    Wt 231 lb (104.8 kg)    BMI 40.92 kg/m  CONSTITUTIONAL: No acute distress, well nourished. HEENT:  Normocephalic, atraumatic, extraocular motion intact. NECK:  Trachea is midline, no jugular venous distention RESPIRATORY:  Lungs are clear, and breath sounds are equal bilaterally. Normal respiratory effort without pathologic use of accessory muscles. CARDIOVASCULAR: Heart is regular without murmurs, gallops, or rubs. GI: The abdomen is soft, non-distended, with some tenderness at the umbilicus. The  patient has a reducible umbilical incisional hernia at the site of prior port site.  Her other incisions are otherwise well healed.   MUSCULOSKELETAL:  normal gait, no peripheral edema. NEUROLOGIC:  Motor and sensation is grossly normal.  Cranial nerves are grossly intact. PSYCH:  Alert and oriented to person, place and time. Affect is normal.   Labs/Imaging: CT scan 08/21/20: IMPRESSION: Small umbilical hernia containing fat.   Stranding at the periumbilical soft tissues consistent with recent surgery, though cannot exclude cellulitis by imaging.   No acute intra-abdominal or intrapelvic abnormalities.   Assessment and Plan: This is a 40 y.o. female with incisional hernia at prior umbilical port site.   --Discussed with the patient that we can continue our original plan for a robotic assisted incisional hernia repair with mesh.  Reviewed the surgery at length with the patient and had also previously discussed risks of bleeding, infection, injury to surrounding structures, that this is an outpatient procedure, that we can provide an abdominal binder, post-op recovery and activity restrictions, and she's willing to proceed.    24, MD [facility]

## 2021-03-11 NOTE — Anesthesia Procedure Notes (Signed)
Procedure Name: Intubation Date/Time: 03/11/2021 7:38 AM Performed by: Carter Kitten, CRNA Pre-anesthesia Checklist: Patient identified, Patient being monitored, Timeout performed, Emergency Drugs available and Suction available Patient Re-evaluated:Patient Re-evaluated prior to induction Oxygen Delivery Method: Circle system utilized Preoxygenation: Pre-oxygenation with 100% oxygen Induction Type: IV induction Ventilation: Mask ventilation without difficulty Laryngoscope Size: 3 and McGraph Grade View: Grade I Tube type: Oral Tube size: 7.0 mm Number of attempts: 1 Airway Equipment and Method: Stylet Placement Confirmation: ETT inserted through vocal cords under direct vision, positive ETCO2 and breath sounds checked- equal and bilateral Secured at: 21 cm Tube secured with: Tape Dental Injury: Teeth and Oropharynx as per pre-operative assessment

## 2021-03-11 NOTE — Anesthesia Preprocedure Evaluation (Signed)
Anesthesia Evaluation  Patient identified by MRN, date of birth, ID band Patient awake    Reviewed: Allergy & Precautions, H&P , NPO status , Patient's Chart, lab work & pertinent test results, reviewed documented beta blocker date and time   Airway Mallampati: III  TM Distance: >3 FB Neck ROM: full    Dental  (+) Teeth Intact   Pulmonary asthma , sleep apnea and Continuous Positive Airway Pressure Ventilation , Current Smoker and Patient abstained from smoking.,    Pulmonary exam normal        Cardiovascular Exercise Tolerance: Good negative cardio ROS Normal cardiovascular exam Rhythm:regular Rate:Normal     Neuro/Psych  Headaches, PSYCHIATRIC DISORDERS Anxiety Depression    GI/Hepatic Neg liver ROS, GERD  Medicated,  Endo/Other  negative endocrine ROS  Renal/GU negative Renal ROS  negative genitourinary   Musculoskeletal   Abdominal   Peds  Hematology negative hematology ROS (+)   Anesthesia Other Findings Past Medical History: No date: Allergy No date: Anxiety No date: Depression No date: GERD (gastroesophageal reflux disease) No date: Heartburn No date: Migraines No date: Sleep apnea Past Surgical History: 12/2017: ANTERIOR CRUCIATE LIGAMENT REPAIR 05/06/2020: ESOPHAGOGASTRODUODENOSCOPY (EGD) WITH PROPOFOL; N/A     Comment:  Procedure: ESOPHAGOGASTRODUODENOSCOPY (EGD) WITH               PROPOFOL;  Surgeon: Wyline Mood, MD;  Location: [facility]               ENDOSCOPY;  Service: Gastroenterology;  Laterality: N/A;               COVID POSITIVE 04/03/2020 07/02/2020: ROBOTIC ASSISTED LAPAROSCOPIC CHOLECYSTECTOMY 12/2014: TUMOR REMOVAL     Comment:  Abdominal BMI    Body Mass Index: 37.91 kg/m     Reproductive/Obstetrics negative OB ROS                             Anesthesia Physical Anesthesia Plan  ASA: 3  Anesthesia Plan: General ETT   Post-op Pain Management:     Induction:   PONV Risk Score and Plan: 3  Airway Management Planned:   Additional Equipment:   Intra-op Plan:   Post-operative Plan:   Informed Consent: I have reviewed the patients History and Physical, chart, labs and discussed the procedure including the risks, benefits and alternatives for the proposed anesthesia with the patient or authorized representative who has indicated his/her understanding and acceptance.     Dental Advisory Given  Plan Discussed with: CRNA  Anesthesia Plan Comments:         Anesthesia Quick Evaluation

## 2021-03-11 NOTE — Op Note (Signed)
°  Procedure Date:  03/11/2021  Pre-operative Diagnosis:  Incisional hernia  Post-operative Diagnosis: Incisional hernia  Procedure:  Robotic assisted Incisional Hernia Repair with mesh  Surgeon:  Howie Ill, MD  Anesthesia:  General endotracheal  Estimated Blood Loss:  10 ml  Specimens:  None  Complications:  None  Indications for Procedure:  This is a 40 y.o. female who presents with an incisional hernia at prior umbilical port, s/p prior robotic cholecystectomy.  The options of surgery versus observation were reviewed with the patient and/or family. The risks of bleeding, abscess or infection, recurrence of symptoms, potential for an open procedure, injury to surrounding structures, and chronic pain were all discussed with the patient and was willing to proceed.  Description of Procedure: The patient was correctly identified in the preoperative area and brought into the operating room.  The patient was placed supine with VTE prophylaxis in place.  Appropriate time-outs were performed.  Anesthesia was induced and the patient was intubated.  Appropriate antibiotics were infused.  The abdomen was prepped and draped in a sterile fashion. The patient's hernia defect was marked with a marking pen.  A Veress needle was introduced in the left upper quadrant and pneumoperitoneum was obtained with appropriate pressures.  Using Optiview technique, an 8 mm port was introduced in the left lateral abdominal wall without complications.  Then, a 12 mm port was introduced in the left upper quadrant and an 8 mm port in the left lower quadrant under direct visualization.  A 4.5 inch Bard Ventralight ST Echo mesh, a 0 Stratafix suture, and two 3-0 V-loc sutures were inserted through the 12 mm port under direct visualization.  The DaVinci platform was docked, camera targeted, and instruments placed under direct visualization.  The patient's hernia was fully reduced and the peritoneum and preperitoneal  fat were dissected to allow better exposure of the hernia defect and for better mesh placement.  The hernia defect was closed using the stratafix suture.  A PMI was brought through the center of the hernia defect and the positioning system of the mesh was passed through and insufflated.  This allowed the mesh to splay open and be centered over the repair site with good overlap.  The mesh was then sutured in place circumferentially and through the center of the mesh using the V-loc sutures.  All needles and the positioning system were then removed through the 12 mm port without complications.  The preperitoneal fat was placed in an endocatch bag through the 12 mm port.  The DaVinci platform was then undocked and instruments removed.    50 ml of Exparel solution mixed with 0.5% bupivacaine with epi was infiltrated around the mesh edges, hernia repair site, and port sites.  The 12 mm port was removed, the endocatch bag removed, and the fascia was closed under direct visualization utilizing an Endo Close technique with 0 Vicryl suture.  The 8 mm ports were removed. The 12 mm incision was closed using 3-0 Vicryl and 4-0 Monocryl, and the other port incisions were closed with 4-0 Monocryl.  The wounds were cleaned and sealed with DermaBond.  The patient was emerged from anesthesia and extubated and brought to the recovery room for further management.  The patient tolerated the procedure well and all counts were correct at the end of the case.   Howie Ill, MD

## 2021-03-11 NOTE — Discharge Instructions (Addendum)
AMBULATORY SURGERY  DISCHARGE INSTRUCTIONS   The drugs that you were given will stay in your system until tomorrow so for the next 24 hours you should not:  Drive an automobile Make any legal decisions Drink any alcoholic beverage   You may resume regular meals tomorrow.  Today it is better to start with liquids and gradually work up to solid foods.  You may eat anything you prefer, but it is better to start with liquids, then soup and crackers, and gradually work up to solid foods.   Please notify your doctor immediately if you have any unusual bleeding, trouble breathing, redness and pain at the surgery site, drainage, fever, or pain not relieved by medication.    Additional Instructions:  Keep green arm band on for 4 days  Please contact your physician with any problems or Same Day Surgery at [phone], Monday through Friday 6 am to 4 pm, or [place] [facility] number at [phone].  

## 2021-03-14 NOTE — Anesthesia Postprocedure Evaluation (Signed)
Anesthesia Post Note  Patient: Jo Soto  Procedure(s) Performed: XI ROBOTIC ASSISTED VENTRAL HERNIA, incisional hernia repair with mesh (Abdomen)  Patient location during evaluation: PACU Anesthesia Type: General Level of consciousness: awake and alert Pain management: pain level controlled Vital Signs Assessment: post-procedure vital signs reviewed and stable Respiratory status: spontaneous breathing, nonlabored ventilation, respiratory function stable and patient connected to nasal cannula oxygen Cardiovascular status: blood pressure returned to baseline and stable Postop Assessment: no apparent nausea or vomiting Anesthetic complications: no   No notable events documented.   Last Vitals:  Vitals:   03/11/21 1130 03/11/21 1203  BP: (!) 143/94 (!) 146/96  Pulse: 69 81  Resp: 18 16  Temp: (!) 36.2 C 36.6 C  SpO2: 98% 96%    Last Pain:  Vitals:   03/12/21 0935  TempSrc:   PainSc: 8                  Yevette Edwards

## 2021-03-26 ENCOUNTER — Other Ambulatory Visit: Payer: Self-pay

## 2021-03-26 ENCOUNTER — Ambulatory Visit (INDEPENDENT_AMBULATORY_CARE_PROVIDER_SITE_OTHER): Payer: BC Managed Care – PPO | Admitting: Surgery

## 2021-03-26 ENCOUNTER — Encounter: Payer: Self-pay | Admitting: Surgery

## 2021-03-26 VITALS — BP 145/102 | HR 83 | Temp 98.8°F | Ht 63.0 in | Wt 217.8 lb

## 2021-03-26 DIAGNOSIS — Z09 Encounter for follow-up examination after completed treatment for conditions other than malignant neoplasm: Secondary | ICD-10-CM

## 2021-03-26 DIAGNOSIS — K432 Incisional hernia without obstruction or gangrene: Secondary | ICD-10-CM

## 2021-03-26 MED ORDER — CYCLOBENZAPRINE HCL 5 MG PO TABS: 5.0000 mg | ORAL_TABLET | Freq: Three times a day (TID) | ORAL | 1 refills | Status: DC | PRN

## 2021-03-26 NOTE — Progress Notes (Signed)
03/26/2021  HPI: Sujata Maines is a 41 y.o. female s/p robotic assisted incisional hernia repair on 03/11/21.  She presents today for follow up.  She reports that over the past few days she's been experiencing some additional discomfort at the left sided incisions and the umbilical area.  Denies any bulging sensation.  She's been alternating between Tylenol and Ibuprofen.  She did not like how the Oxycodone was making her feels so she has not really taken much of it.    Vital signs: BP (!) 145/102    Pulse 83    Temp 98.8 F (37.1 C) (Oral)    Ht 5\' 3"  (1.6 m)    Wt 217 lb 12.8 oz (98.8 kg)    SpO2 97%    BMI 38.58 kg/m    Physical Exam: Constitutional: No acute distress Abdomen:  Soft, non-distended, with appropriate tenderness to palpation.  On exam, there is no evidence of incisional hernia recurrence.  The hernia repair site has a palpable area of firmness consistent with the scar tissue and suture repair.  The left sided incisions are healing well, without evidence of infection.  The discomfort on the left side is lateral to the LUQ 12 mm port site.    Assessment/Plan: This is a 41 y.o. female s/p robotic assisted incisional hernia repair.  --The patient reports that the main pain happens after she's sitting for a long time.  She is wearing her abdominal binder which helps.  On exam, there is no evidence of hernia recurrence or complications.  I think this may be related to the sutures and healing process and the patient being a bit more active.  Discussed with her that she should continue wearing her binder to help with support of the repair site.  She did not like how oxycodone made her feel so sleepy, so will try a prescription for flexeril.  Discussed with her the option for gabapentin, but I think this may make her sleepy as well.  If the flexeril does not help, she will contact 24 so we can change her prescription to gabapentin. --Follow up in 3 weeks to assess her progress.   [place], MD  [facility]

## 2021-03-26 NOTE — Patient Instructions (Addendum)
Please pick up your prescription at your pharmacy.   If you have any concerns or questions, please feel free to call our office. See follow up appointment below.   GENERAL POST-OPERATIVE PATIENT INSTRUCTIONS   WOUND CARE INSTRUCTIONS:  Keep a dry clean dressing on the wound if there is drainage. The initial bandage may be removed after 24 hours.  Once the wound has quit draining you may leave it open to air.  If clothing rubs against the wound or causes irritation and the wound is not draining you may cover it with a dry dressing during the daytime.  Try to keep the wound dry and avoid ointments on the wound unless directed to do so.  If the wound becomes bright red and painful or starts to drain infected material that is not clear, please contact your physician immediately.  If the wound is mildly pink and has a thick firm ridge underneath it, this is normal, and is referred to as a healing ridge.  This will resolve over the next 4-6 weeks.  BATHING: You may shower if you have been informed of this by your surgeon. However, Please do not submerge in a tub, hot tub, or pool until incisions are completely sealed or have been told by your surgeon that you may do so.  DIET:  You may eat any foods that you can tolerate.  It is a good idea to eat a high fiber diet and take in plenty of fluids to prevent constipation.  If you do become constipated you may want to take a mild laxative or take ducolax tablets on a daily basis until your bowel habits are regular.  Constipation can be very uncomfortable, along with straining, after recent surgery.  ACTIVITY:  You are encouraged to cough and deep breath or use your incentive spirometer if you were given one, every 15-30 minutes when awake.  This will help prevent respiratory complications and low grade fevers post-operatively if you had a general anesthetic.  You may want to hug a pillow when coughing and sneezing to add additional support to the surgical area,  if you had abdominal or chest surgery, which will decrease pain during these times.  You are encouraged to walk and engage in light activity for the next two weeks.  You should not lift, push or pull more than 15-20 pounds, until 04/22/2021 as it could put you at increased risk for complications.  Twenty pounds is roughly equivalent to a plastic bag of groceries. At that time- Listen to your body when lifting, if you have pain when lifting, stop and then try again in a few days. Soreness after doing exercises or activities of daily living is normal as you get back in to your normal routine.  MEDICATIONS:  Try to take narcotic medications and anti-inflammatory medications, such as tylenol, ibuprofen, naprosyn, etc., with food.  This will minimize stomach upset from the medication.  Should you develop nausea and vomiting from the pain medication, or develop a rash, please discontinue the medication and contact your physician.  You should not drive, make important decisions, or operate machinery when taking narcotic pain medication.  SUNBLOCK Use sun block to incision area over the next year if this area will be exposed to sun. This helps decrease scarring and will allow you avoid a permanent darkened area over your incision.  QUESTIONS:  Please feel free to call our office if you have any questions, and we will be glad to assist you.  Laparoscopic Ventral Hernia Repair, Care After  The following information offers guidance on how to care for yourself after your procedure. Your health care provider may also give you more specific instructions. If you have problems or questions, contact your health care provider. What can I expect after the procedure? After the procedure, it is common to have pain, discomfort, or soreness. Follow these instructions at home: Medicines Take over-the-counter and prescription medicines only as told by your health care provider. Ask your health care provider if the  medicine prescribed to you: Requires you to avoid driving or using machinery. Can cause constipation. You may need to take these actions to prevent or treat constipation: Drink enough fluid to keep your urine pale yellow. Take over-the-counter or prescription medicines. Eat foods that are high in fiber, such as beans, whole grains, and fresh fruits and vegetables. Limit foods that are high in fat and processed sugars, such as fried or sweet foods. Incision care  Follow instructions from your health care provider about how to take care of your incisions. Make sure you: Wash your hands with soap and water for at least 20 seconds before and after you change your bandage (dressing) or before you touch your abdomen. If soap and water are not available, use hand sanitizer. Change your dressing as told by your health care provider. Leave stitches (sutures), skin glue, or adhesive strips in place. These skin closures may need to stay in place for 2 weeks or longer. If adhesive strip edges start to loosen and curl up, you may trim the loose edges. Do not remove adhesive strips completely unless your health care provider tells you to do that. Check your incision areas every day for signs of infection. Check for: More redness, swelling, or pain. Fluid or blood. Warmth. Pus or a bad smell. Bathing  Do not take baths, swim, or use a hot tub until your health care provider approves. Ask your health care provider if you may take showers. You may only be allowed to take sponge baths. Keep your dressing dry until your health care provider says it can be removed. Activity  Rest as told by your health care provider. Avoid sitting for a long time without moving. Get up to take short walks every 1-2 hours. This is important to improve blood flow and breathing. Ask for help if you feel weak or unsteady. Do not lift anything that is heavier than 10 lb (4.5 kg), or the limit that you are told, until your health  care provider says that it is safe. If you were given a sedative during the procedure, it can affect you for several hours. Do not drive or operate machinery until your health care provider says that it is safe. Return to your normal activities as told by your health care provider. Ask your health care provider what activities are safe for you. General instructions  Hold a pillow over your abdomen when you cough or sneeze. This helps with pain. Do not use any products that contain nicotine or tobacco. These products include cigarettes, chewing tobacco, and vaping devices, such as e-cigarettes. These can delay healing after surgery. If you need help quitting, ask your health care provider. You may be asked to continue to do deep breathing exercises at home. This will help to prevent a lung infection. Keep all follow-up visits. This is important. Contact a health care provider if: You have any of these signs of infection: More redness, swelling, or pain around an incision. Fluid  or blood coming from an incision. Warmth coming from an incision. Pus or a bad smell coming from an incision. A fever or chills. You have pain that gets worse or does not get better with medicine. You have nausea or vomiting. You have a cough. You have shortness of breath. You have not had a bowel movement in 3 days. You are not able to urinate. Get help right away if you have: Severe pain in your abdomen. Persistent nausea and vomiting. Redness, warmth, or pain in your leg. Chest pain. Trouble breathing. These symptoms may represent a serious problem that is an emergency. Do not wait to see if the symptoms will go away. Get medical help right away. Call your local emergency services (911 in the [place] Do not drive yourself to the hospital. Summary After this procedure, it is common to have pain, discomfort, or soreness. Follow instructions from your health care provider about how to take care of your  incision. Check your incision area every day for signs of infection. Report any signs of infection to your health care provider. Keep all follow-up visits. This is important. This information is not intended to replace advice given to you by your health care provider. Make sure you discuss any questions you have with your health care provider. Document Revised: 10/27/2019 Document Reviewed: 10/27/2019 Elsevier Patient Education  2022 [employer]

## 2021-04-09 ENCOUNTER — Telehealth: Payer: Self-pay | Admitting: *Deleted

## 2021-04-09 NOTE — Telephone Encounter (Signed)
Faxed FMLA to [employer] at [phone]

## 2021-04-10 ENCOUNTER — Telehealth: Payer: Self-pay | Admitting: Adult Health

## 2021-04-10 DIAGNOSIS — K219 Gastro-esophageal reflux disease without esophagitis: Secondary | ICD-10-CM

## 2021-04-10 MED ORDER — OMEPRAZOLE 40 MG PO CPDR: 40.0000 mg | DELAYED_RELEASE_CAPSULE | Freq: Two times a day (BID) | ORAL | 0 refills | Status: DC

## 2021-04-10 NOTE — Telephone Encounter (Signed)
[facility] faxed refill request for the following medications: ? ?omeprazole (PRILOSEC) 40 MG capsule  ? ?Please advise. ? ? ? ?

## 2021-04-14 ENCOUNTER — Encounter: Payer: Self-pay | Admitting: Surgery

## 2021-04-14 ENCOUNTER — Other Ambulatory Visit: Payer: Self-pay

## 2021-04-14 ENCOUNTER — Ambulatory Visit (INDEPENDENT_AMBULATORY_CARE_PROVIDER_SITE_OTHER): Payer: BC Managed Care – PPO | Admitting: Surgery

## 2021-04-14 VITALS — BP 140/90 | HR 92 | Temp 98.4°F | Ht 63.0 in | Wt 218.0 lb

## 2021-04-14 DIAGNOSIS — Z09 Encounter for follow-up examination after completed treatment for conditions other than malignant neoplasm: Secondary | ICD-10-CM

## 2021-04-14 DIAGNOSIS — K432 Incisional hernia without obstruction or gangrene: Secondary | ICD-10-CM

## 2021-04-14 NOTE — Progress Notes (Signed)
04/14/2021  HPI: Jo Soto is a 41 y.o. female s/p robotic assisted incisional hernia repair with mesh on 03/11/21.  She presents today for follow up.  She reports that since we last saw her on 03/26/21, her pain has been improving both in the LUQ incision and the umbilical areas.  Denies worsening pain.  The pain sometimes is sharp but short lasting.  She uses her binder to help with comfort.  Vital signs: BP 140/90    Pulse 92    Temp 98.4 F (36.9 C)    Ht 5\' 3"  (1.6 m)    Wt 218 lb (98.9 kg)    BMI 38.62 kg/m    Physical Exam: Constitutional:  No acute distress Abdomen:  Soft, non-distended, with appropriate soreness to palpation at the umbilicus and the LUQ incisions.  No evidence of hernia recurrence, all incisions well healed.  Assessment/Plan: This is a 41 y.o. female s/p robotic assisted incisional hernia repair.  --Discussed with the patient that the discomfort will continue to improve as the healing continues.  She's a month out from surgery.  Continue with activity restrictions until total 6 weeks from surgery.  She may return to work after that with no restrictions.  Work note given. --Instructed to let 24 know if these areas of discomfort have not fully improved over the next two months so we can see her again.  Otherwise follow up as needed.   [place], MD [facility]

## 2021-04-14 NOTE — Patient Instructions (Addendum)
Each week the discomfort will get better. You may return to work after 03/22/21 with no restrictions.  Call us if in 2 months you are still having problems with pain. You may use the binder as needed after 03/22/21, then it will no longer necessary.   Our fax number is [phone].   GENERAL POST-OPERATIVE PATIENT INSTRUCTIONS   WOUND CARE INSTRUCTIONS:  the wound is mildly pink and has a thick firm ridge underneath it, this is normal, and is referred to as a healing ridge.  This will resolve over the next 4-6 weeks.  BATHING: You may shower if you have been informed of this by your surgeon. However, Please do not submerge in a tub, hot tub, or pool until incisions are completely sealed or have been told by your surgeon that you may do so.  DIET:  You may eat any foods that you can tolerate.  It is a good idea to eat a high fiber diet and take in plenty of fluids to prevent constipation.  If you do become constipated you may want to take a mild laxative or take ducolax tablets on a daily basis until your bowel habits are regular.  Constipation can be very uncomfortable, along with straining, after recent surgery.  ACTIVITY: You are encouraged to walk and engage in light activity for the next two weeks.  You should not lift more than 20 pounds for 6 weeks after surgery as it could put you at increased risk for complications.  Twenty pounds is roughly equivalent to a plastic bag of groceries. At that time- Listen to your body when lifting, if you have pain when lifting, stop and then try again in a few days. Soreness after doing exercises or activities of daily living is normal as you get back in to your normal routine.  MEDICATIONS:  Try to take narcotic medications and anti-inflammatory medications, such as tylenol, ibuprofen, naprosyn, etc., with food.  This will minimize stomach upset from the medication.  Should you develop nausea and vomiting from the pain medication, or develop a rash,  please discontinue the medication and contact your physician.  You should not drive, make important decisions, or operate machinery when taking narcotic pain medication.  SUNBLOCK Use sun block to incision area over the next year if this area will be exposed to sun. This helps decrease scarring and will allow you avoid a permanent darkened area over your incision.  QUESTIONS:  Please feel free to call our office if you have any questions, and we will be glad to assist you.

## 2021-04-16 ENCOUNTER — Encounter: Payer: BC Managed Care – PPO | Admitting: Surgery

## 2021-04-30 ENCOUNTER — Encounter: Payer: Self-pay | Admitting: Surgery

## 2021-05-05 ENCOUNTER — Other Ambulatory Visit: Payer: Self-pay

## 2021-05-05 ENCOUNTER — Encounter: Payer: Self-pay | Admitting: Surgery

## 2021-05-05 ENCOUNTER — Ambulatory Visit (INDEPENDENT_AMBULATORY_CARE_PROVIDER_SITE_OTHER): Payer: BC Managed Care – PPO | Admitting: Surgery

## 2021-05-05 VITALS — BP 145/98 | HR 86 | Temp 99.0°F | Ht 63.0 in | Wt 219.4 lb

## 2021-05-05 DIAGNOSIS — Z09 Encounter for follow-up examination after completed treatment for conditions other than malignant neoplasm: Secondary | ICD-10-CM

## 2021-05-05 DIAGNOSIS — K432 Incisional hernia without obstruction or gangrene: Secondary | ICD-10-CM

## 2021-05-05 NOTE — Progress Notes (Signed)
05/05/2021  HPI: Jo Soto is a 41 y.o. female s/p robotic assisted incisional hernia repair on 03/11/21.  She presents today with epigastric and LUQ pain.  The patient reports this has started a few weeks ago and has been persistent.  She reports pain in the central epigastric and LUQ areas  after any po intake.  She's at times had issues with emesis and sometimes just a lot of pain.  She had been taking Ibuprofen and Flexeril as part of her post-op pain but she has stopped the ibuprofen.  She continues taking her PPI twice a day.  Denies any fevers, chills, chest pain, shortness of breath.  Vital signs: BP (!) 145/98    Pulse 86    Temp 99 F (37.2 C)    Ht 5\' 3"  (1.6 m)    Wt 219 lb 6.4 oz (99.5 kg)    SpO2 98%    BMI 38.86 kg/m    Physical Exam: Constitutional: No acute distress Abdomen:  soft, non-distended, with tenderness in the epigastric and LUQ areas.  No peritonitis.  Incisions from her gallbladder and hernia surgery are well healed. No evidence of recurrent hernia with some discomfort at the umbilicus still.  Assessment/Plan: This is a 41 y.o. female s/p robotic assisted incisional hernia repair.  --Discussed with the patient that the epigastric and LUQ pain may be more related to her GERD and gastroparesis issues.  There is no evidence of hernia recurrence and this would not contribute to pain with any po intake like she's having.  This has started many months after her gallbladder surgery, so also unlikely that it would be related to it.   --Recommended that she contact Dr. 24 office to set up an appointment.  She is already on PPI BID so recommended to take Tums to help as well.  Instead of ibuprofen, she may start over the counter lidocaine or menthol patches to apply over the umbilical area in case there's still discomfort from her hernia repair. --Follow up as needed.   Johnney Killian, MD [facility]

## 2021-05-05 NOTE — Patient Instructions (Addendum)
Avoid Ibuprofen, Motrin, or Aspirin to prevent any GI upset.  Contact [facility] to get a follow up appointment with Dr Vicente Males. If you are having trouble getting in to see him in a timely manor, just let us know.   You may get a Lidocaine patch, or Salonpas patch, to help with any discomfort.  You may use Tums to help with any other problems with acid issues.     Follow-up with our office as needed.  Please call and ask to speak with a nurse if you develop questions or concerns.

## 2021-05-14 ENCOUNTER — Other Ambulatory Visit: Payer: Self-pay | Admitting: Family Medicine

## 2021-05-14 DIAGNOSIS — K219 Gastro-esophageal reflux disease without esophagitis: Secondary | ICD-10-CM

## 2021-05-15 ENCOUNTER — Telehealth: Payer: Self-pay | Admitting: *Deleted

## 2021-05-15 NOTE — Telephone Encounter (Signed)
I called pt and made her an appt with Merita Norton, FNP for 05/26/2021 at 1:00 for check up and refills.

## 2021-05-15 NOTE — Telephone Encounter (Signed)
Requested medication (s) are due for refill today:   Yes  Requested medication (s) are on the active medication list:   Yes  Future visit scheduled:   Yes made her an appt for 05/26/2021 with Merita Norton   Last ordered: 1/19/202 #60, 0 refills  Returned for provider to review for refills prior to upcoming appt since courtesy refill was given in Jan.   Requested Prescriptions  Pending Prescriptions Disp Refills   omeprazole (PRILOSEC) 40 MG capsule [Pharmacy Med Name: OMEPRAZOLE DR 40 MG CAPSULE] 60 capsule 0    Sig: Take 1 capsule (40 mg total) by mouth in the morning and at bedtime. Please schedule an office visit before anymore refills     Gastroenterology: Proton Pump Inhibitors Passed - 05/14/2021  3:41 PM      Passed - Valid encounter within last 12 months    Recent Outpatient Visits           7 months ago Left wrist pain   [facility] Vigg, Avanti, MD   11 months ago Vitamin D insufficiency   [facility] Flinchum, Eula Fried, FNP   1 year ago Anxiety   [facility] Flinchum, Eula Fried, FNP   1 year ago Anxiety   [facility] Flinchum, Eula Fried, FNP       Future Appointments             In 1 week Jacky Kindle, FNP [facility]

## 2021-05-26 ENCOUNTER — Other Ambulatory Visit: Payer: Self-pay

## 2021-05-26 ENCOUNTER — Ambulatory Visit (INDEPENDENT_AMBULATORY_CARE_PROVIDER_SITE_OTHER): Payer: BC Managed Care – PPO | Admitting: Family Medicine

## 2021-05-26 ENCOUNTER — Ambulatory Visit
Admission: RE | Admit: 2021-05-26 | Discharge: 2021-05-26 | Disposition: A | Discharge status: Home / Self Care | Payer: BC Managed Care – PPO | Source: Ambulatory Visit | Attending: Family Medicine | Admitting: Family Medicine

## 2021-05-26 ENCOUNTER — Telehealth: Payer: Self-pay

## 2021-05-26 ENCOUNTER — Ambulatory Visit
Admission: RE | Admit: 2021-05-26 | Discharge: 2021-05-26 | Disposition: A | Discharge status: Home / Self Care | Payer: BC Managed Care – PPO | Attending: Family Medicine | Admitting: Family Medicine

## 2021-05-26 ENCOUNTER — Encounter: Payer: Self-pay | Admitting: Family Medicine

## 2021-05-26 VITALS — BP 136/92 | HR 80 | Temp 99.1°F | Wt 220.0 lb

## 2021-05-26 DIAGNOSIS — K432 Incisional hernia without obstruction or gangrene: Secondary | ICD-10-CM | POA: Diagnosis not present

## 2021-05-26 DIAGNOSIS — E6609 Other obesity due to excess calories: Secondary | ICD-10-CM | POA: Insufficient documentation

## 2021-05-26 DIAGNOSIS — R030 Elevated blood-pressure reading, without diagnosis of hypertension: Secondary | ICD-10-CM | POA: Insufficient documentation

## 2021-05-26 DIAGNOSIS — K589 Irritable bowel syndrome without diarrhea: Secondary | ICD-10-CM | POA: Diagnosis not present

## 2021-05-26 DIAGNOSIS — Z6838 Body mass index (BMI) 38.0-38.9, adult: Secondary | ICD-10-CM

## 2021-05-26 DIAGNOSIS — R109 Unspecified abdominal pain: Secondary | ICD-10-CM | POA: Diagnosis not present

## 2021-05-26 DIAGNOSIS — F322 Major depressive disorder, single episode, severe without psychotic features: Secondary | ICD-10-CM | POA: Insufficient documentation

## 2021-05-26 DIAGNOSIS — K311 Adult hypertrophic pyloric stenosis: Secondary | ICD-10-CM

## 2021-05-26 DIAGNOSIS — Z23 Encounter for immunization: Secondary | ICD-10-CM | POA: Insufficient documentation

## 2021-05-26 DIAGNOSIS — R1084 Generalized abdominal pain: Secondary | ICD-10-CM | POA: Insufficient documentation

## 2021-05-26 DIAGNOSIS — Z114 Encounter for screening for human immunodeficiency virus [HIV]: Secondary | ICD-10-CM | POA: Diagnosis not present

## 2021-05-26 DIAGNOSIS — K219 Gastro-esophageal reflux disease without esophagitis: Secondary | ICD-10-CM

## 2021-05-26 DIAGNOSIS — R112 Nausea with vomiting, unspecified: Secondary | ICD-10-CM | POA: Diagnosis not present

## 2021-05-26 DIAGNOSIS — Z1159 Encounter for screening for other viral diseases: Secondary | ICD-10-CM | POA: Insufficient documentation

## 2021-05-26 MED ORDER — TIZANIDINE HCL 4 MG PO TABS: 4.0000 mg | ORAL_TABLET | Freq: Three times a day (TID) | ORAL | 0 refills | Status: DC

## 2021-05-26 MED ORDER — BUPROPION HCL ER (XL) 150 MG PO TB24: 300.0000 mg | ORAL_TABLET | Freq: Every day | ORAL | 1 refills | Status: DC

## 2021-05-26 MED ORDER — PANTOPRAZOLE SODIUM 40 MG PO TBEC: 40.0000 mg | DELAYED_RELEASE_TABLET | Freq: Two times a day (BID) | ORAL | 1 refills | Status: DC

## 2021-05-26 NOTE — Assessment & Plan Note (Signed)
History of incisional hernia status post gallbladder repair ?Continues to have abdominal complaints after hernia repair ?We will send for imaging today ?Encouraged to follow-up with general surgery ?Referrals have been sent to GI ?

## 2021-05-26 NOTE — Assessment & Plan Note (Signed)
Patient reports 1 month history of nausea and vomiting ?No focal weight gain or loss noted ?Patient reports eating bland carbs such as crackers or bread ?Patient endorses drinking large amounts of water to assist sometimes up to 2 gallons per day ?

## 2021-05-26 NOTE — Assessment & Plan Note (Signed)
History of gastric outlet obstruction ?Gallbladder has since been removed in April 2022 ?Patient with ongoing "stomach issues" since initial procedure ?

## 2021-05-26 NOTE — Assessment & Plan Note (Signed)
Low risk screen ?Treatable, and curable. If left untreated Hep C can lead to cirrhosis and liver failure. ?Encourage routine testing; recommend testing if risk factors change. ? ?

## 2021-05-26 NOTE — Progress Notes (Signed)
Argentina Ponder [place],acting as a scribe for [name], FNP.,have documented all relevant documentation on the behalf of [name], FNP,as directed by  [name], FNP while in the presence of [name], FNP.    Established patient visit   Patient: Jo Soto   DOB: [birth year]   41 y.o. Female  MRN: [id number] Visit Date: 05/26/2021  Today's healthcare provider: [name], FNP  Patient presents for new patient visit to establish care.  Introduced to Designer, jewellery role and practice setting.  All questions answered.  Discussed provider/patient relationship and expectations.  No chief complaint on file.  Subjective    HPI  Depression, Follow-up  She  was last seen for this 12 months ago. Changes made at last visit include none.   She reports good compliance with treatment. She is not having side effects.   She reports good tolerance of treatment. She feels she is doing better when on the medicine.  She ran out and has been out for a few months and since then she has been able to see that it was helping her.  Depression screen [facility] 2/9 05/26/2021 10/14/2020 01/30/2020  Decreased Interest 2 0 1  Down, Depressed, Hopeless 2 0 2  PHQ - 2 Score 4 0 3  Altered sleeping 3 - 3  Tired, decreased energy 3 - 3  Change in appetite 3 - 1  Feeling bad or failure about yourself  0 - 0  Trouble concentrating 0 - 1  Moving slowly or fidgety/restless 0 - 1  Suicidal thoughts 0 - 0  PHQ-9 Score 13 - 12  Difficult doing work/chores Very difficult - Somewhat difficult    -----------------------------------------------------------------------------------------   Medications: Outpatient Medications Prior to Visit  Medication Sig   acetaminophen (TYLENOL) 500 MG tablet Take 2 tablets (1,000 mg total) by mouth every 6 (six) hours as needed for mild pain.   diclofenac Sodium (VOLTAREN) 1 % GEL APPLY 2 GRAMS TO AFFECTED AREA 4 TIMES A DAY (Patient taking differently: 2 g  daily as needed (pain).)   ibuprofen (ADVIL) 600 MG tablet Take 600 mg by mouth every 6 (six) hours as needed for moderate pain. If needed for pain   levonorgestrel (MIRENA) 20 MCG/DAY IUD 1 each by Intrauterine route once.   meloxicam (MOBIC) 15 MG tablet Take 15 mg by mouth daily.   Multiple Vitamin (MULTIVITAMIN WITH MINERALS) TABS tablet Take 1 tablet by mouth in the morning.   [DISCONTINUED] buPROPion (WELLBUTRIN XL) 300 MG 24 hr tablet Take 1 tablet (300 mg total) by mouth daily.   [DISCONTINUED] cyclobenzaprine (FLEXERIL) 5 MG tablet Take 1 tablet (5 mg total) by mouth 3 (three) times daily as needed for muscle spasms.   [DISCONTINUED] omeprazole (PRILOSEC) 40 MG capsule TAKE 1 CAPSULE (40 MG TOTAL) BY MOUTH IN THE MORNING AND AT BEDTIME. PLEASE SCHEDULE AN OFFICE VISIT BEFORE ANYMORE REFILLS   No facility-administered medications prior to visit.    Review of Systems  Psychiatric/Behavioral:  Positive for dysphoric mood and sleep disturbance (can't fall asleep and can't stay asleep). Negative for agitation, confusion, decreased concentration, hallucinations and self-injury. The patient is not nervous/anxious and is not hyperactive.       Objective     Vitals:   05/26/21 1312 05/26/21 1316  BP: (!) 143/101 (!) 136/92  Pulse: 80   Temp: 99.1 F (37.3 C)   TempSrc: Oral   SpO2: 100%   Weight: 220 lb (99.8 kg)  Physical Exam Vitals and nursing note reviewed.  Constitutional:      General: She is not in acute distress.    Appearance: Normal appearance. She is obese. She is not ill-appearing, toxic-appearing or diaphoretic.  HENT:     Head: Normocephalic and atraumatic.  Cardiovascular:     Rate and Rhythm: Normal rate and regular rhythm.     Pulses: Normal pulses.     Heart sounds: Normal heart sounds. No murmur heard.   No friction rub. No gallop.  Pulmonary:     Effort: Pulmonary effort is normal. No respiratory distress.     Breath sounds: Normal breath sounds.  No stridor. No wheezing, rhonchi or rales.  Chest:     Chest wall: No tenderness.  Abdominal:     General: Bowel sounds are normal.     Palpations: Abdomen is soft.     Tenderness: There is abdominal tenderness. There is guarding and rebound.     Hernia: A hernia is present.  Musculoskeletal:        General: No swelling, tenderness, deformity or signs of injury. Normal range of motion.     Right lower leg: No edema.     Left lower leg: No edema.  Skin:    General: Skin is warm and dry.     Capillary Refill: Capillary refill takes less than 2 seconds.     Coloration: Skin is not jaundiced or pale.     Findings: No bruising, erythema, lesion or rash.  Neurological:     General: No focal deficit present.     Mental Status: She is alert and oriented to person, place, and time. Mental status is at baseline.     Cranial Nerves: No cranial nerve deficit.     Sensory: No sensory deficit.     Motor: No weakness.     Coordination: Coordination normal.  Psychiatric:        Mood and Affect: Mood normal.        Behavior: Behavior normal.        Thought Content: Thought content normal.        Judgment: Judgment normal.     No results found for any visits on 05/26/21.  Assessment & Plan     Problem List Items Addressed This Visit       Digestive   Gastric outlet obstruction    History of gastric outlet obstruction Gallbladder has since been removed in April 2022 Patient with ongoing "stomach issues" since initial procedure      Relevant Orders   DG Abd 2 Views   Gastroesophageal reflux disease without esophagitis    History of GERD previously on omeprazole Was previously referred to GI however has not been seen at this time Will escalate PPI to stronger agent today and will rerefer      Relevant Medications   pantoprazole (PROTONIX) 40 MG tablet   Irritable bowel syndrome without diarrhea    History of irritable bowel syndrome previously without diarrhea Now has complaints  of diarrhea within the last 1 month associated symptoms include nausea and vomiting Has tried use of Pepto and Tums to assist      Relevant Medications   pantoprazole (PROTONIX) 40 MG tablet   Other Relevant Orders   DG Abd 2 Views   Nausea and vomiting in adult    Patient reports 1 month history of nausea and vomiting No focal weight gain or loss noted Patient reports eating bland carbs such as crackers or bread Patient  endorses drinking large amounts of water to assist sometimes up to 2 gallons per day        Other   Abdominal cramping    Complaints of generalized abdominal cramping Patient reports constipation with mixed diarrhea will defer to GI to assist      Relevant Medications   tiZANidine (ZANAFLEX) 4 MG tablet   Other Relevant Orders   Ambulatory referral to Gastroenterology   DG Abd 2 Views   Class 2 obesity due to excess calories without serious comorbidity with body mass index (BMI) of 38.0 to 38.9 in adult    Body mass index is 38.97 kg/m. Discussed importance of healthy weight management Discussed diet and exercise       Relevant Orders   Comprehensive Metabolic Panel (CMET)   Hemoglobin A1c   Depression, major, single episode, severe (HCC) - Primary    Chronic, severe depression Has been off medications for multiple months PHQ is elevated We will refer out today to psychiatry Encourage restarting medication Contracted for safety denies suicidal thoughts, suicidal ideas, or thoughts of homicide      Relevant Medications   buPROPion (WELLBUTRIN XL) 150 MG 24 hr tablet   Other Relevant Orders   Ambulatory referral to Psychiatry   Elevated blood pressure reading in office without diagnosis of hypertension    Blood pressure elevated However, patient presented with acute abdominal pain greater than 1 month Recommend follow-up for restart of Wellbutrin Will readdress blood pressure at that time Encouraged home blood pressure checks or follow-up with  nursing staff in office outside of appointment times for blood pressure checks      Relevant Orders   CBC with Differential/Platelet   Comprehensive Metabolic Panel (CMET)   Hemoglobin A1c   TSH + free T4   Encounter for hepatitis C screening test for low risk patient    Low risk screen Treatable, and curable. If left untreated Hep C can lead to cirrhosis and liver failure. Encourage routine testing; recommend testing if risk factors change.       Relevant Orders   Hepatitis C Antibody   Encounter for screening for HIV    Low risk screen Consented; encouraged to "know your status" Recommend repeat screen if risk factors change       Relevant Orders   HIV antibody (with reflex)   Flu vaccine need    Flu vaccine provided today      Relevant Orders   Flu Vaccine QUAD 6+ mos PF IM (Fluarix Quad PF) (Completed)   Generalized abdominal pain    1 month of complicated generalized abdominal pain with associated vomiting, nausea, diarrhea No sick contacts No new medications No out of country travel Was managed over-the-counter with use of Pepto-Bismol and Tums      Relevant Medications   tiZANidine (ZANAFLEX) 4 MG tablet   Other Relevant Orders   Ambulatory referral to Gastroenterology   DG Abd 2 Views   Incisional hernia, without obstruction or gangrene    History of incisional hernia status post gallbladder repair Continues to have abdominal complaints after hernia repair We will send for imaging today Encouraged to follow-up with general surgery Referrals have been sent to GI      Relevant Orders   DG Abd 2 Views     Return in about 6 months (around 11/26/2021) for anxiety and depression.      Vonna Kotyk, FNP, have reviewed all documentation for this visit. The documentation on 05/26/21 for the exam,  diagnosis, procedures, and orders are all accurate and complete.    [name], [place] [phone] (phone) [phone]  (fax)  [place]

## 2021-05-26 NOTE — Assessment & Plan Note (Signed)
Low risk screen ?Consented; encouraged to "know your status" ?Recommend repeat screen if risk factors change ? ?

## 2021-05-26 NOTE — Telephone Encounter (Signed)
Scheduled for 08/12/2021 °

## 2021-05-26 NOTE — Assessment & Plan Note (Signed)
Blood pressure elevated ?However, patient presented with acute abdominal pain greater than 1 month ?Recommend follow-up for restart of Wellbutrin ?Will readdress blood pressure at that time ?Encouraged home blood pressure checks or follow-up with nursing staff in office outside of appointment times for blood pressure checks ?

## 2021-05-26 NOTE — Assessment & Plan Note (Signed)
1 month of complicated generalized abdominal pain with associated vomiting, nausea, diarrhea ?No sick contacts ?No new medications ?No out of country travel ?Was managed over-the-counter with use of Pepto-Bismol and Tums ?

## 2021-05-26 NOTE — Assessment & Plan Note (Signed)
History of GERD previously on omeprazole ?Was previously referred to GI however has not been seen at this time ?Will escalate PPI to stronger agent today and will rerefer ?

## 2021-05-26 NOTE — Assessment & Plan Note (Signed)
Chronic, severe depression ?Has been off medications for multiple months ?PHQ is elevated ?We will refer out today to psychiatry ?Encourage restarting medication ?Contracted for safety denies suicidal thoughts, suicidal ideas, or thoughts of homicide ?

## 2021-05-26 NOTE — Assessment & Plan Note (Signed)
Body mass index is 38.97 kg/m?. ?Discussed importance of healthy weight management ?Discussed diet and exercise ? ?

## 2021-05-26 NOTE — Assessment & Plan Note (Signed)
History of irritable bowel syndrome previously without diarrhea ?Now has complaints of diarrhea within the last 1 month associated symptoms include nausea and vomiting ?Has tried use of Pepto and Tums to assist ?

## 2021-05-26 NOTE — Assessment & Plan Note (Signed)
Complaints of generalized abdominal cramping ?Patient reports constipation with mixed diarrhea will defer to GI to assist ?

## 2021-05-26 NOTE — Assessment & Plan Note (Signed)
Flu vaccine provided today.  

## 2021-05-27 LAB — CBC WITH DIFFERENTIAL/PLATELET
Basophils Absolute: 0 10*3/uL (ref 0.0–0.2)
Basos: 1 %
EOS (ABSOLUTE): 0.2 10*3/uL (ref 0.0–0.4)
Eos: 3 %
Hematocrit: 44 % (ref 34.0–46.6)
Hemoglobin: 14 g/dL (ref 11.1–15.9)
Immature Grans (Abs): 0 10*3/uL (ref 0.0–0.1)
Immature Granulocytes: 0 %
Lymphocytes Absolute: 1.8 10*3/uL (ref 0.7–3.1)
Lymphs: 40 %
MCH: 27.6 pg (ref 26.6–33.0)
MCHC: 31.8 g/dL (ref 31.5–35.7)
MCV: 87 fL (ref 79–97)
Monocytes Absolute: 0.3 10*3/uL (ref 0.1–0.9)
Monocytes: 7 %
Neutrophils Absolute: 2.2 10*3/uL (ref 1.4–7.0)
Neutrophils: 49 %
Platelets: 315 10*3/uL (ref 150–450)
RBC: 5.07 x10E6/uL (ref 3.77–5.28)
RDW: 14.4 % (ref 11.7–15.4)
WBC: 4.6 10*3/uL (ref 3.4–10.8)

## 2021-05-27 LAB — TSH+FREE T4
Free T4: 1.03 ng/dL (ref 0.82–1.77)
TSH: 1.04 u[IU]/mL (ref 0.450–4.500)

## 2021-05-27 LAB — COMPREHENSIVE METABOLIC PANEL
ALT: 22 IU/L (ref 0–32)
AST: 19 IU/L (ref 0–40)
Albumin/Globulin Ratio: 1.7 (ref 1.2–2.2)
Albumin: 4.4 g/dL (ref 3.8–4.8)
Alkaline Phosphatase: 64 IU/L (ref 44–121)
BUN/Creatinine Ratio: 17 (ref 9–23)
BUN: 16 mg/dL (ref 6–24)
Bilirubin Total: 0.4 mg/dL (ref 0.0–1.2)
CO2: 20 mmol/L (ref 20–29)
Calcium: 9.4 mg/dL (ref 8.7–10.2)
Chloride: 106 mmol/L (ref 96–106)
Creatinine, Ser: 0.96 mg/dL (ref 0.57–1.00)
Globulin, Total: 2.6 g/dL (ref 1.5–4.5)
Glucose: 93 mg/dL (ref 70–99)
Potassium: 4.1 mmol/L (ref 3.5–5.2)
Sodium: 142 mmol/L (ref 134–144)
Total Protein: 7 g/dL (ref 6.0–8.5)
eGFR: 77 mL/min/{1.73_m2} (ref >59)

## 2021-05-27 LAB — HEMOGLOBIN A1C
Est. average glucose Bld gHb Est-mCnc: 117 mg/dL
Hgb A1c MFr Bld: 5.7 % — ABNORMAL HIGH (ref 4.8–5.6)

## 2021-05-27 LAB — HIV ANTIBODY (ROUTINE TESTING W REFLEX): HIV Screen 4th Generation wRfx: NONREACTIVE

## 2021-05-27 LAB — HEPATITIS C ANTIBODY: Hep C Virus Ab: NONREACTIVE

## 2021-05-29 DIAGNOSIS — Z6839 Body mass index (BMI) 39.0-39.9, adult: Secondary | ICD-10-CM | POA: Diagnosis not present

## 2021-05-29 DIAGNOSIS — B9689 Other specified bacterial agents as the cause of diseases classified elsewhere: Secondary | ICD-10-CM | POA: Diagnosis not present

## 2021-05-29 DIAGNOSIS — N760 Acute vaginitis: Secondary | ICD-10-CM | POA: Diagnosis not present

## 2021-05-29 DIAGNOSIS — Z01419 Encounter for gynecological examination (general) (routine) without abnormal findings: Secondary | ICD-10-CM | POA: Diagnosis not present

## 2021-05-29 DIAGNOSIS — Z113 Encounter for screening for infections with a predominantly sexual mode of transmission: Secondary | ICD-10-CM | POA: Diagnosis not present

## 2021-06-03 DIAGNOSIS — Z1231 Encounter for screening mammogram for malignant neoplasm of breast: Secondary | ICD-10-CM | POA: Diagnosis not present

## 2021-07-21 ENCOUNTER — Encounter: Payer: Self-pay | Admitting: Family Medicine

## 2021-07-21 ENCOUNTER — Ambulatory Visit (INDEPENDENT_AMBULATORY_CARE_PROVIDER_SITE_OTHER): Payer: BC Managed Care – PPO | Admitting: Family Medicine

## 2021-07-21 VITALS — BP 120/72 | Temp 98.3°F | Resp 16 | Wt 222.6 lb

## 2021-07-21 DIAGNOSIS — F339 Major depressive disorder, recurrent, unspecified: Secondary | ICD-10-CM

## 2021-07-21 DIAGNOSIS — F5101 Primary insomnia: Secondary | ICD-10-CM | POA: Diagnosis not present

## 2021-07-21 DIAGNOSIS — R1084 Generalized abdominal pain: Secondary | ICD-10-CM

## 2021-07-21 MED ORDER — METOCLOPRAMIDE HCL 5 MG PO TABS: 5.0000 mg | ORAL_TABLET | Freq: Three times a day (TID) | ORAL | 0 refills | Status: DC

## 2021-07-21 MED ORDER — ZOLPIDEM TARTRATE ER 6.25 MG PO TBCR
6.2500 mg | EXTENDED_RELEASE_TABLET | Freq: Every evening | ORAL | 0 refills | Status: DC | PRN
Start: 2021-07-21 — End: 2021-09-01

## 2021-07-21 NOTE — Assessment & Plan Note (Signed)
Chronic, stable ?Continue Wellbutrin 300 mg ?Denies additional concerns today beyond insomnia ?Has been on other medications prior ?

## 2021-07-21 NOTE — Progress Notes (Signed)
?  ?[employer] as a Neurosurgeon for Jacky Kindle, FNP.,have documented all relevant documentation on the behalf of Jacky Kindle, FNP,as directed by  Jacky Kindle, FNP while in the presence of Jacky Kindle, FNP.  ? ?Established patient visit ? ?Patient: Jo Soto   DOB: Mar 20, 1981   41 y.o. Female  MRN: [id number] ?Visit Date: 07/21/2021 ? ?Today's healthcare provider: Jacky Kindle, FNP  ?Re Introduced to nurse practitioner role and practice setting.  All questions answered.  Discussed provider/patient relationship and expectations. ? ?Subjective  ?  ?HPI ?HPI   ? ? Follow-up   ? Additional comments: Patient returns back to clinic today for 2 month follow up to address IBS and loss of appetite. ? ?  ?  ?Last edited by Fonda Kinder, CMA on 07/21/2021  1:14 PM.  ?  ?  ?Depression, Follow-up ? ?She  was last seen for this 2 months ago. ?Changes made at last visit include We will refer out today to psychiatry ?Encourage restarting medication ?Contracted for safety denies suicidal thoughts, suicidal ideas, or thoughts of homicide. ?  ?She reports excellent compliance with treatment. ?She is not having side effects.  ? ?She reports excellent tolerance of treatment. ?Current symptoms include: fatigue, insomnia, and psychomotor agitation ?She feels she is Unchanged since last visit. ? ? ?  05/26/2021  ?  1:11 PM 10/14/2020  ?  1:48 PM 01/30/2020  ?  4:26 PM  ?Depression screen PHQ 2/9  ?Decreased Interest 2 0 1  ?Down, Depressed, Hopeless 2 0 2  ?PHQ - 2 Score 4 0 3  ?Altered sleeping 3  3  ?Tired, decreased energy 3  3  ?Change in appetite 3  1  ?Feeling bad or failure about yourself  0  0  ?Trouble concentrating 0  1  ?Moving slowly or fidgety/restless 0  1  ?Suicidal thoughts 0  0  ?PHQ-9 Score 13  12  ?Difficult doing work/chores Very difficult  Somewhat difficult  ?  ?----------------------------------------------------------------------------------------- ?Medications: ?Outpatient Medications  Prior to Visit  ?Medication Sig  ? acetaminophen (TYLENOL) 500 MG tablet Take 2 tablets (1,000 mg total) by mouth every 6 (six) hours as needed for mild pain.  ? buPROPion (WELLBUTRIN XL) 150 MG 24 hr tablet Take 2 tablets (300 mg total) by mouth daily.  ? diclofenac Sodium (VOLTAREN) 1 % GEL APPLY 2 GRAMS TO AFFECTED AREA 4 TIMES A DAY (Patient taking differently: 2 g daily as needed (pain).)  ? ibuprofen (ADVIL) 600 MG tablet Take 600 mg by mouth every 6 (six) hours as needed for moderate pain. If needed for pain  ? levonorgestrel (MIRENA) 20 MCG/DAY IUD 1 each by Intrauterine route once.  ? meloxicam (MOBIC) 15 MG tablet Take 15 mg by mouth daily.  ? Multiple Vitamin (MULTIVITAMIN WITH MINERALS) TABS tablet Take 1 tablet by mouth in the morning.  ? pantoprazole (PROTONIX) 40 MG tablet Take 1 tablet (40 mg total) by mouth 2 (two) times daily before a meal.  ? tiZANidine (ZANAFLEX) 4 MG tablet Take 1 tablet (4 mg total) by mouth 3 (three) times daily.  ? ?No facility-administered medications prior to visit.  ? ?Review of Systems ? ?  Objective  ?  ?BP 120/72   Temp 98.3 ?F (36.8 ?C) (Oral)   Resp 16   Wt 222 lb 9.6 oz (101 kg)   BMI 39.43 kg/m?  ? ?Physical Exam ?Vitals and nursing note reviewed.  ?Constitutional:   ?  General: She is not in acute distress. ?   Appearance: Normal appearance. She is obese. She is not ill-appearing, toxic-appearing or diaphoretic.  ?HENT:  ?   Head: Normocephalic and atraumatic.  ?Cardiovascular:  ?   Rate and Rhythm: Normal rate and regular rhythm.  ?   Pulses: Normal pulses.  ?   Heart sounds: Normal heart sounds. No murmur heard. ?  No friction rub. No gallop.  ?Pulmonary:  ?   Effort: Pulmonary effort is normal. No respiratory distress.  ?   Breath sounds: Normal breath sounds. No stridor. No wheezing, rhonchi or rales.  ?Chest:  ?   Chest wall: No tenderness.  ?Abdominal:  ?   Comments: Continued complaints of abdominal pain, tenderness, and occasional "dumping" ?Poor  appetite; may eat 1 meal per day, around 1400-1500 ?Complaints of excess saliva in mouth following meals and nausea, occasionally to the point of vomiting and/or diarrhea   ?Musculoskeletal:     ?   General: No swelling, tenderness, deformity or signs of injury. Normal range of motion.  ?   Right lower leg: No edema.  ?   Left lower leg: No edema.  ?Skin: ?   General: Skin is warm and dry.  ?   Capillary Refill: Capillary refill takes less than 2 seconds.  ?   Coloration: Skin is not jaundiced or pale.  ?   Findings: No bruising, erythema, lesion or rash.  ?Neurological:  ?   General: No focal deficit present.  ?   Mental Status: She is alert and oriented to person, place, and time. Mental status is at baseline.  ?   Cranial Nerves: No cranial nerve deficit.  ?   Sensory: No sensory deficit.  ?   Motor: No weakness.  ?   Coordination: Coordination normal.  ?Psychiatric:     ?   Mood and Affect: Mood normal.     ?   Behavior: Behavior normal.     ?   Thought Content: Thought content normal.     ?   Judgment: Judgment normal.  ? ?No results found for any visits on 07/21/21. ? Assessment & Plan  ?  ? ?Problem List Items Addressed This Visit   ? ?  ? Other  ? Depression, recurrent (HCC) - Primary  ?  Chronic, stable ?Continue Wellbutrin 300 mg ?Denies additional concerns today beyond insomnia ?Has been on other medications prior ? ?  ?  ? Generalized abdominal pain  ?  Chronic, stable ?Continued complaints of abdominal pain, tenderness, and occasional "dumping" ?Poor appetite; may eat 1 meal per day, around 1400-1500 ?Complaints of excess saliva in mouth following meals and nausea, occasionally to the point of vomiting and/or diarrhea  ?Will try reglan 5 mg, up to 4 times/day with meals to assist ?At this point, patient may take 1-2 times/day ?Has upcoming appt with GI later this month ?Has not noticed a change since switching from Prilosec to Protonix  ?  ?  ? Relevant Medications  ? metoCLOPramide (REGLAN) 5 MG tablet   ? Insomnia  ?  Chronic, stable ?Hx of use of trazodone, flexeril, and gabapentin ?Will start low dose continued release Ambien today; due to inability to "stay asleep" ?Reports max of 5 hours of sleep per night ?Has tried "sleep hygiene" and has not been successful ?Has a 45 minute commute to work 4 days/week- patient does not wish to feel residual side effects of medication upon morning commute ?Encourage 1 month f/u ? ?  ?  ?  Relevant Medications  ? zolpidem (AMBIEN CR) 6.25 MG CR tablet  ? ?Return in about 4 weeks (around 08/18/2021) for chonic disease management.  ?   ?I, Jacky Kindle, FNP, have reviewed all documentation for this visit. The documentation on 07/21/21 for the exam, diagnosis, procedures, and orders are all accurate and complete. ? ?Jacky Kindle, FNP  ?[facility] ?[phone] (phone) ?[phone] (fax) ? ?[facility] ?

## 2021-07-21 NOTE — Assessment & Plan Note (Signed)
Chronic, stable ?Hx of use of trazodone, flexeril, and gabapentin ?Will start low dose continued release Ambien today; due to inability to "stay asleep" ?Reports max of 5 hours of sleep per night ?Has tried "sleep hygiene" and has not been successful ?Has a 45 minute commute to work 4 days/week- patient does not wish to feel residual side effects of medication upon morning commute ?Encourage 1 month f/u ?

## 2021-07-21 NOTE — Assessment & Plan Note (Signed)
Chronic, stable ?Continued complaints of abdominal pain, tenderness, and occasional "dumping" ?Poor appetite; may eat 1 meal per day, around 1400-1500 ?Complaints of excess saliva in mouth following meals and nausea, occasionally to the point of vomiting and/or diarrhea  ?Will try reglan 5 mg, up to 4 times/day with meals to assist ?At this point, patient may take 1-2 times/day ?Has upcoming appt with GI later this month ?Has not noticed a change since switching from Prilosec to Protonix  ?

## 2021-08-12 ENCOUNTER — Ambulatory Visit (INDEPENDENT_AMBULATORY_CARE_PROVIDER_SITE_OTHER): Payer: BC Managed Care – PPO | Admitting: Gastroenterology

## 2021-08-12 ENCOUNTER — Encounter: Payer: Self-pay | Admitting: Gastroenterology

## 2021-08-12 ENCOUNTER — Other Ambulatory Visit: Payer: Self-pay

## 2021-08-12 VITALS — BP 136/91 | HR 88 | Temp 99.3°F | Wt 226.6 lb

## 2021-08-12 DIAGNOSIS — R1013 Epigastric pain: Secondary | ICD-10-CM | POA: Diagnosis not present

## 2021-08-12 MED ORDER — DICYCLOMINE HCL 10 MG PO CAPS: 10.0000 mg | ORAL_CAPSULE | Freq: Three times a day (TID) | ORAL | 0 refills | Status: DC | PRN

## 2021-08-12 MED ORDER — OMEPRAZOLE 40 MG PO CPDR: 40.0000 mg | DELAYED_RELEASE_CAPSULE | Freq: Every day | ORAL | 1 refills | Status: DC

## 2021-08-12 NOTE — Progress Notes (Unsigned)
Wyline Mood MD, MRCP(U.K) [address]  [address]  [place], [place] [address]  Main: [phone]  Fax: [phone]   Primary Care Physician: Jacky Kindle, FNP  Primary Gastroenterologist:  Dr. Wyline Mood   Chief Complaint  Patient presents with   Abdominal Pain    HPI: Jo Soto is a 41 y.o. female   Summary of history :   Initially referred and seen on 04/23/2020 for IBS.  She also has history of GERD, gastroparesis.  She has been issues with regurgitation, abdominal cramping right after she eats in the central part of her abdomen associated with a bowel movement.  Issues of heartburn in the past.  Had lost some weight.  On Prilosec 40 mg a day which did not help. 05/07/2020: HIDA scan: Ejection fraction 19%: Referred to Dr. Aleen Campi for cholecystectomy 05/06/2020: EGD: Food seen in the stomach moderate in quantity.  05/29/2020: Gastric emptying study: Normal 07/02/2020: Cholecystectomy: Pathology specimen demonstrated chronic cholecystitis    Interval history   08/06/2020-08/12/2021 She says that the reason she is here to see me is that she has been having left upper quadrant pain for the past few months.  Episodic.  Usually begins about 30 minutes after she eats.  Does not feel like the pain from her gallbladder that she had previously.  Cramping in nature.  Nonradiating.  Not relieved by bowel movement.  Not affected by stress.  No exposure to marijuana.  No vomiting.  Has not tried PPI.  Denies any constipation.  No weight loss rather has gained weight.   She states the discomfort is significant.  Denies any sensation of early satiety. Not related to stress  Current Outpatient Medications  Medication Sig Dispense Refill   buPROPion (WELLBUTRIN XL) 150 MG 24 hr tablet Take 2 tablets (300 mg total) by mouth daily. 180 tablet 1   levonorgestrel (MIRENA) 20 MCG/DAY IUD 1 each by Intrauterine route once.     metoCLOPramide (REGLAN) 5 MG tablet Take 1 tablet (5 mg total)  by mouth 4 (four) times daily -  before meals and at bedtime. 120 tablet 0   Multiple Vitamin (MULTIVITAMIN WITH MINERALS) TABS tablet Take 1 tablet by mouth in the morning.     pantoprazole (PROTONIX) 40 MG tablet Take 1 tablet (40 mg total) by mouth 2 (two) times daily before a meal. 180 tablet 1   zolpidem (AMBIEN CR) 6.25 MG CR tablet Take 1 tablet (6.25 mg total) by mouth at bedtime as needed for sleep. 30 tablet 0   No current facility-administered medications for this visit.    Allergies as of 08/12/2021   (No Known Allergies)    ROS:  General: Negative for anorexia, weight loss, fever, chills, fatigue, weakness. ENT: Negative for hoarseness, difficulty swallowing , nasal congestion. CV: Negative for chest pain, angina, palpitations, dyspnea on exertion, peripheral edema.  Respiratory: Negative for dyspnea at rest, dyspnea on exertion, cough, sputum, wheezing.  GI: See history of present illness. GU:  Negative for dysuria, hematuria, urinary incontinence, urinary frequency, nocturnal urination.  Endo: Negative for unusual weight change.    Physical Examination:   BP (!) 136/91   Pulse 88   Temp 99.3 F (37.4 C) (Oral)   Wt 226 lb 9.6 oz (102.8 kg)   BMI 40.14 kg/m   General: Well-nourished, well-developed in no acute distress.  Eyes: No icterus. Conjunctivae pink. Mouth: Oropharyngeal mucosa moist and pink , no lesions erythema or exudate. Lungs: Clear to auscultation bilaterally. Non-labored.  Heart: Regular rate and rhythm, no murmurs rubs or gallops.  Abdomen: Bowel sounds are normal, nontender, nondistended, no hepatosplenomegaly or masses, no abdominal bruits or hernia , no rebound or guarding.   Extremities: No lower extremity edema. No clubbing or deformities. Neuro: Alert and oriented x 3.  Grossly intact. Skin: Warm and dry, no jaundice.   Psych: Alert and cooperative, normal mood and affect.   Imaging Studies: No results found.  Assessment and Plan:    Jo Soto is a 41 y.o. y/o female here to follow-up for dyspepsia.  She has had new onset left upper quadrant pain which is different from what she has at baseline.  Going on for at least 6 months. Plan 1.  Commence on Prilosec 40 mg once a day trial of Bentyl 10 mg 3 times daily as needed as needed for pain 2.  EGD next week, H. pylori breath test today 3.  If pain persists at next visit may consider CT scan of the abdomen and if negative may consider treatment for functional dyspepsia with amitriptyline.  I have discussed alternative options, risks & benefits,  which include, but are not limited to, bleeding, infection, perforation,respiratory complication & drug reaction.  The patient agrees with this plan & written consent will be obtained.     Dr Wyline Mood  MD,MRCP [facility]) Follow up in 3 months

## 2021-08-16 ENCOUNTER — Other Ambulatory Visit: Payer: Self-pay | Admitting: Gastroenterology

## 2021-08-20 ENCOUNTER — Ambulatory Visit: Payer: BC Managed Care – PPO | Admitting: Certified Registered Nurse Anesthetist

## 2021-08-20 ENCOUNTER — Encounter: Payer: Self-pay | Admitting: Gastroenterology

## 2021-08-20 ENCOUNTER — Encounter
Admission: RE | Disposition: A | Discharge status: Home / Self Care | Payer: Self-pay | Source: Home / Self Care | Attending: Gastroenterology

## 2021-08-20 ENCOUNTER — Ambulatory Visit
Admission: RE | Admit: 2021-08-20 | Discharge: 2021-08-20 | Disposition: A | Discharge status: Home / Self Care | Payer: BC Managed Care – PPO | Attending: Gastroenterology | Admitting: Gastroenterology

## 2021-08-20 DIAGNOSIS — G4730 Sleep apnea, unspecified: Secondary | ICD-10-CM | POA: Insufficient documentation

## 2021-08-20 DIAGNOSIS — E669 Obesity, unspecified: Secondary | ICD-10-CM | POA: Diagnosis not present

## 2021-08-20 DIAGNOSIS — J45909 Unspecified asthma, uncomplicated: Secondary | ICD-10-CM | POA: Insufficient documentation

## 2021-08-20 DIAGNOSIS — K219 Gastro-esophageal reflux disease without esophagitis: Secondary | ICD-10-CM | POA: Insufficient documentation

## 2021-08-20 DIAGNOSIS — K2960 Other gastritis without bleeding: Secondary | ICD-10-CM | POA: Insufficient documentation

## 2021-08-20 DIAGNOSIS — R1013 Epigastric pain: Secondary | ICD-10-CM | POA: Diagnosis not present

## 2021-08-20 DIAGNOSIS — F17210 Nicotine dependence, cigarettes, uncomplicated: Secondary | ICD-10-CM | POA: Insufficient documentation

## 2021-08-20 DIAGNOSIS — Z6838 Body mass index (BMI) 38.0-38.9, adult: Secondary | ICD-10-CM | POA: Insufficient documentation

## 2021-08-20 HISTORY — PX: ESOPHAGOGASTRODUODENOSCOPY (EGD) WITH PROPOFOL: SHX5813

## 2021-08-20 LAB — POCT PREGNANCY, URINE: Preg Test, Ur: NEGATIVE

## 2021-08-20 SURGERY — ESOPHAGOGASTRODUODENOSCOPY (EGD) WITH PROPOFOL
Anesthesia: General

## 2021-08-20 MED ORDER — PROPOFOL 500 MG/50ML IV EMUL
INTRAVENOUS | Status: AC
  Filled 2021-08-20: qty 50

## 2021-08-20 MED ORDER — PROPOFOL 500 MG/50ML IV EMUL
INTRAVENOUS | Status: DC | PRN
  Administered 2021-08-20: 150 ug/kg/min via INTRAVENOUS

## 2021-08-20 MED ORDER — PROPOFOL 10 MG/ML IV BOLUS
INTRAVENOUS | Status: DC | PRN
  Administered 2021-08-20: 70 mg via INTRAVENOUS

## 2021-08-20 MED ORDER — LIDOCAINE HCL (CARDIAC) PF 100 MG/5ML IV SOSY
PREFILLED_SYRINGE | INTRAVENOUS | Status: DC | PRN
  Administered 2021-08-20: 50 mg via INTRAVENOUS

## 2021-08-20 MED ORDER — SODIUM CHLORIDE 0.9 % IV SOLN
INTRAVENOUS | Status: DC
  Administered 2021-08-20: 1000 mL via INTRAVENOUS

## 2021-08-20 MED ORDER — LIDOCAINE HCL (PF) 2 % IJ SOLN
INTRAMUSCULAR | Status: AC
  Filled 2021-08-20: qty 5

## 2021-08-20 NOTE — Anesthesia Preprocedure Evaluation (Signed)
Anesthesia Evaluation  Patient identified by MRN, date of birth, ID band Patient awake    Reviewed: Allergy & Precautions, NPO status , Patient's Chart, lab work & pertinent test results  History of Anesthesia Complications Negative for: history of anesthetic complications  Airway Mallampati: II  TM Distance: >3 FB Neck ROM: Full    Dental no notable dental hx. (+) Teeth Intact   Pulmonary asthma , sleep apnea , neg COPD, Current Smoker and Patient abstained from smoking.,    Pulmonary exam normal breath sounds clear to auscultation       Cardiovascular Exercise Tolerance: Good METS(-) hypertension(-) CAD and (-) Past MI negative cardio ROS  (-) dysrhythmias  Rhythm:Regular Rate:Normal - Systolic murmurs    Neuro/Psych  Headaches, PSYCHIATRIC DISORDERS Anxiety Depression    GI/Hepatic GERD  Medicated and Controlled,(+)     (-) substance abuse  ,   Endo/Other  neg diabetes  Renal/GU negative Renal ROS     Musculoskeletal   Abdominal (+) + obese,   Peds  Hematology   Anesthesia Other Findings Past Medical History: No date: Allergy No date: Anxiety No date: Depression No date: GERD (gastroesophageal reflux disease) No date: Heartburn No date: Migraines No date: Sleep apnea  Reproductive/Obstetrics                             Anesthesia Physical Anesthesia Plan  ASA: 2  Anesthesia Plan: General   Post-op Pain Management: Minimal or no pain anticipated   Induction: Intravenous  PONV Risk Score and Plan: 2 and Propofol infusion, TIVA and Ondansetron  Airway Management Planned: Nasal Cannula  Additional Equipment: None  Intra-op Plan:   Post-operative Plan:   Informed Consent: I have reviewed the patients History and Physical, chart, labs and discussed the procedure including the risks, benefits and alternatives for the proposed anesthesia with the patient or authorized  representative who has indicated his/her understanding and acceptance.     Dental advisory given  Plan Discussed with: CRNA and Surgeon  Anesthesia Plan Comments: (Discussed risks of anesthesia with patient, including possibility of difficulty with spontaneous ventilation under anesthesia necessitating airway intervention, PONV, and rare risks such as cardiac or respiratory or neurological events, and allergic reactions. Discussed the role of CRNA in patient's perioperative care. Patient understands. Patient counseled on benefits of smoking cessation, and increased perioperative risks associated with continued smoking. )        Anesthesia Quick Evaluation

## 2021-08-20 NOTE — Transfer of Care (Signed)
Immediate Anesthesia Transfer of Care Note  Patient: Jo Soto  Procedure(s) Performed: ESOPHAGOGASTRODUODENOSCOPY (EGD) WITH PROPOFOL  Patient Location: Endoscopy Unit  Anesthesia Type:General  Level of Consciousness: drowsy  Airway & Oxygen Therapy: Patient Spontanous Breathing  Post-op Assessment: Report given to RN and Post -op Vital signs reviewed and stable  Post vital signs: Reviewed and stable  Last Vitals:  Vitals Value Taken Time  BP    Temp    Pulse 84 08/20/21 1011  Resp 21 08/20/21 1011  SpO2 100 % 08/20/21 1011  Vitals shown include unvalidated device data.  Last Pain:  Vitals:   08/20/21 0923  TempSrc: Temporal  PainSc: 0-No pain         Complications: No notable events documented.

## 2021-08-20 NOTE — H&P (Signed)
Wyline Mood, MD [address], [address], [place], [place], [address] [address], [place], [place], [address] Phone: [phone]  Fax: [phone]  Primary Care Physician:  Jacky Kindle, FNP   Pre-Procedure History & Physical: HPI:  Jo Soto is a 41 y.o. female is here for an endoscopy    Past Medical History:  Diagnosis Date   Allergy    Anxiety    Depression    GERD (gastroesophageal reflux disease)    Heartburn    Migraines    Sleep apnea     Past Surgical History:  Procedure Laterality Date   ANTERIOR CRUCIATE LIGAMENT REPAIR  12/2017   CHOLECYSTECTOMY     ESOPHAGOGASTRODUODENOSCOPY (EGD) WITH PROPOFOL N/A 05/06/2020   Procedure: ESOPHAGOGASTRODUODENOSCOPY (EGD) WITH PROPOFOL;  Surgeon: Wyline Mood, MD;  Location: [facility] ENDOSCOPY;  Service: Gastroenterology;  Laterality: N/A;  COVID POSITIVE 04/03/2020   HERNIA REPAIR     ROBOTIC ASSISTED LAPAROSCOPIC CHOLECYSTECTOMY  07/02/2020   TUMOR REMOVAL  12/2014   Abdominal   XI ROBOTIC ASSISTED VENTRAL HERNIA N/A 03/11/2021   Procedure: XI ROBOTIC ASSISTED VENTRAL HERNIA, incisional hernia repair with mesh;  Surgeon: Henrene Dodge, MD;  Location: [facility] ORS;  Service: General;  Laterality: N/A;    Prior to Admission medications   Medication Sig Start Date End Date Taking? Authorizing Provider  buPROPion (WELLBUTRIN XL) 150 MG 24 hr tablet Take 2 tablets (300 mg total) by mouth daily. 05/26/21  Yes Jacky Kindle, FNP  dicyclomine (BENTYL) 10 MG capsule Take 1 capsule (10 mg total) by mouth 3 (three) times daily as needed for spasms. 08/12/21 10/11/21 Yes Wyline Mood, MD  levonorgestrel (MIRENA) 20 MCG/DAY IUD 1 each by Intrauterine route once.   Yes [provider]  metoCLOPramide (REGLAN) 5 MG tablet Take 1 tablet (5 mg total) by mouth 4 (four) times daily -  before meals and at bedtime. 07/21/21  Yes Jacky Kindle, FNP  Multiple Vitamin (MULTIVITAMIN WITH MINERALS) TABS tablet Take 1 tablet by  mouth in the morning.   Yes [provider]  omeprazole (PRILOSEC) 40 MG capsule Take 1 capsule (40 mg total) by mouth daily. 08/12/21  Yes Wyline Mood, MD  zolpidem (AMBIEN CR) 6.25 MG CR tablet Take 1 tablet (6.25 mg total) by mouth at bedtime as needed for sleep. 07/21/21  Yes Jacky Kindle, FNP    Allergies as of 08/12/2021   (No Known Allergies)    Family History  Problem Relation Age of Onset   Anemia Mother    Cancer Mother    Diabetes Mother    Heart disease Sister    Diabetes Maternal Aunt    Diabetes Maternal Uncle     Social History   Socioeconomic History   Marital status: Legally Separated    Spouse name: Not on file   Number of children: Not on file   Years of education: Not on file   Highest education level: Not on file  Occupational History    Employer: [employer]  Tobacco Use   Smoking status: Some Days    Packs/day: 0.10    Years: 8.00    Pack years: 0.80    Types: Cigarettes    Passive exposure: Never   Smokeless tobacco: Never  Vaping Use   Vaping Use: Never used  Substance and Sexual Activity   Alcohol use: Yes    Alcohol/week: 9.0 standard drinks    Types: 2 Glasses of wine, 6 Cans of beer, 1  Shots of liquor per week    Comment: 2-3 weekly   Drug use: Never   Sexual activity: Yes  Other Topics Concern   Not on file  Social History Narrative   Not on file   Social Determinants of Health   Financial Resource Strain: Not on file  Food Insecurity: Not on file  Transportation Needs: Not on file  Physical Activity: Not on file  Stress: Not on file  Social Connections: Not on file  Intimate Partner Violence: Not on file    Review of Systems: See HPI, otherwise negative ROS  Physical Exam: BP (!) 147/101   Pulse 68   Temp (!) 96.5 F (35.8 C) (Temporal)   Resp 18   Ht 5\' 3"  (1.6 m)   Wt 99.1 kg   SpO2 100%   BMI 38.70 kg/m  General:   Alert,  pleasant and cooperative in NAD Head:  Normocephalic and atraumatic. Neck:   Supple; no masses or thyromegaly. Lungs:  Clear throughout to auscultation, normal respiratory effort.    Heart:  +S1, +S2, Regular rate and rhythm, No edema. Abdomen:  Soft, nontender and nondistended. Normal bowel sounds, without guarding, and without rebound.   Neurologic:  Alert and  oriented x4;  grossly normal neurologically.  Impression/Plan: Jo Soto is here for an endoscopy  to be performed for  evaluation of dyspepsia    Risks, benefits, limitations, and alternatives regarding endoscopy have been reviewed with the patient.  Questions have been answered.  All parties agreeable.   Velta Addison, MD  08/20/2021, 9:54 AM

## 2021-08-20 NOTE — Op Note (Signed)
[facility] Patient Name: Jo Soto Procedure Date: 08/20/2021 9:52 AM MRN: [id number] Account #: [id number] Date of Birth: 26-Jun-1980 Admit Type: Outpatient Age: 41 Room: [facility] ENDO ROOM 3 Gender: Female Note Status: Finalized Instrument Name: Patton Salles Endoscope [id number] Procedure:             Upper GI endoscopy Indications:           Dyspepsia Providers:             Wyline Mood MD, MD Medicines:             Monitored Anesthesia Care Complications:         No immediate complications. Procedure:             Pre-Anesthesia Assessment:                        - Prior to the procedure, a History and Physical was                         performed, and patient medications, allergies and                         sensitivities were reviewed. The patient's tolerance                         of previous anesthesia was reviewed.                        - The risks and benefits of the procedure and the                         sedation options and risks were discussed with the                         patient. All questions were answered and informed                         consent was obtained.                        - ASA Grade Assessment: II - A patient with mild                         systemic disease.                        After obtaining informed consent, the endoscope was                         passed under direct vision. Throughout the procedure,                         the patient's blood pressure, pulse, and oxygen                         saturations were monitored continuously. The Endoscope                         was introduced through the mouth, and advanced to the  third part of duodenum. The upper GI endoscopy was                         accomplished with ease. The patient tolerated the                         procedure well. Findings:      The esophagus was normal.      The examined duodenum was normal.      The  entire examined stomach was normal. Biopsies were taken with a cold       forceps for histology.      The exam was otherwise without abnormality. Impression:            - Normal esophagus.                        - Normal examined duodenum.                        - Normal stomach. Biopsied.                        - The examination was otherwise normal. Recommendation:        - Await pathology results.                        - Discharge patient to home (with escort).                        - Resume previous diet.                        - Continue present medications.                        - Await pathology results.                        - Return to my office as previously scheduled. Procedure Code(s):     --- Professional ---                        [phone], Esophagogastroduodenoscopy, flexible,                         transoral; with biopsy, single or multiple Diagnosis Code(s):     --- Professional ---                        R10.13, Epigastric pain CPT copyright 2019 American Medical Association. All rights reserved. The codes documented in this report are preliminary and upon coder review may  be revised to meet current compliance requirements. Wyline Mood, MD Wyline Mood MD, MD 08/20/2021 10:09:29 AM This report has been signed electronically. Number of Addenda: 0 Note Initiated On: 08/20/2021 9:52 AM Estimated Blood Loss:  Estimated blood loss: none.      [facility]

## 2021-08-20 NOTE — Anesthesia Postprocedure Evaluation (Signed)
Anesthesia Post Note  Patient: Jo Soto  Procedure(s) Performed: ESOPHAGOGASTRODUODENOSCOPY (EGD) WITH PROPOFOL  Patient location during evaluation: Endoscopy Anesthesia Type: General Level of consciousness: awake and alert Pain management: pain level controlled Vital Signs Assessment: post-procedure vital signs reviewed and stable Respiratory status: spontaneous breathing, nonlabored ventilation, respiratory function stable and patient connected to nasal cannula oxygen Cardiovascular status: blood pressure returned to baseline and stable Postop Assessment: no apparent nausea or vomiting Anesthetic complications: no   No notable events documented.   Last Vitals:  Vitals:   08/20/21 1010 08/20/21 1030  BP: (!) 135/98 (!) 130/94  Pulse: 87 71  Resp: 20 16  Temp: (!) 35.8 C   SpO2: 99% 100%    Last Pain:  Vitals:   08/20/21 1010  TempSrc: Temporal  PainSc:                  [name]

## 2021-08-20 NOTE — Anesthesia Procedure Notes (Signed)
Procedure Name: MAC Date/Time: 08/20/2021 9:53 AM Performed by: Tollie Eth, CRNA Pre-anesthesia Checklist: Patient identified, Emergency Drugs available, Suction available and Patient being monitored Patient Re-evaluated:Patient Re-evaluated prior to induction Oxygen Delivery Method: Simple face mask Induction Type: IV induction Placement Confirmation: positive ETCO2

## 2021-08-21 ENCOUNTER — Telehealth: Payer: Self-pay

## 2021-08-21 ENCOUNTER — Encounter: Payer: Self-pay | Admitting: Gastroenterology

## 2021-08-21 LAB — SURGICAL PATHOLOGY

## 2021-08-21 MED ORDER — DEXLANSOPRAZOLE 30 MG PO CPDR: 30.0000 mg | DELAYED_RELEASE_CAPSULE | Freq: Every day | ORAL | 0 refills | Status: DC

## 2021-08-21 NOTE — Telephone Encounter (Signed)
Dr. Tobi Bastos, we do not have any samples of Dexilant any longer. Would you like for me to send her a prescription?

## 2021-08-21 NOTE — Telephone Encounter (Signed)
Yes we can try that

## 2021-08-21 NOTE — Telephone Encounter (Signed)
Patient stated that she would take Omeprazole 40 MG BID for months. However, since she was not seeing any results, she decided to stop taking them about two months ago. Patient stated that she has 2 bottles left of medications at home that she will not take.
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
## 2021-08-21 NOTE — Telephone Encounter (Signed)
Prescription for Dexilant was sent to patient's pharmacy.

## 2021-08-24 ENCOUNTER — Encounter: Payer: Self-pay | Admitting: Gastroenterology

## 2021-08-26 ENCOUNTER — Telehealth: Payer: Self-pay

## 2021-08-26 NOTE — Telephone Encounter (Signed)
[facility] sent [place] a letter stating that Dexilant is not covered by patient's insurance. They stated that she should try something else. Please advise.

## 2021-08-27 NOTE — Progress Notes (Signed)
Established patient visit   Patient: Jo Soto   DOB: [birth year]   41 y.o. Female  MRN: [id number] Visit Date: 09/01/2021  Today's healthcare provider: [name], FNP  Re Introduced to nurse practitioner role and practice setting.  All questions answered.  Discussed provider/patient relationship and expectations.   I,Tiffany J Bragg,acting as a scribe for [name], FNP.,have documented all relevant documentation on the behalf of [name], FNP,as directed by  [name], FNP while in the presence of [name], FNP.   Chief Complaint  Patient presents with   Insomnia   Subjective    HPI  Follow up for insomnia  The patient was last seen for this 1 months ago. Changes made at last visit include try low dose ambien.  She reports excellent compliance with treatment. She feels that condition is Unchanged. She is having side effects. Grogginess  -----------------------------------------------------------------------------------------   Medications: Outpatient Medications Prior to Visit  Medication Sig Note   buPROPion (WELLBUTRIN XL) 150 MG 24 hr tablet Take 2 tablets (300 mg total) by mouth daily.    dicyclomine (BENTYL) 10 MG capsule Take 1 capsule (10 mg total) by mouth 3 (three) times daily as needed for spasms.    levonorgestrel (MIRENA) 20 MCG/DAY IUD 1 each by Intrauterine route once.    metoCLOPramide (REGLAN) 5 MG tablet Take 1 tablet (5 mg total) by mouth 4 (four) times daily -  before meals and at bedtime.    Multiple Vitamin (MULTIVITAMIN WITH MINERALS) TABS tablet Take 1 tablet by mouth in the morning.    pantoprazole (PROTONIX) 40 MG tablet Take 1 tablet (40 mg total) by mouth 2 (two) times daily.    [DISCONTINUED] zolpidem (AMBIEN CR) 6.25 MG CR tablet Take 1 tablet (6.25 mg total) by mouth at bedtime as needed for sleep. 09/01/2021: side effects   No facility-administered medications prior to visit.    Review of Systems  Last  CBC Lab Results  Component Value Date   WBC 4.6 05/26/2021   HGB 14.0 05/26/2021   HCT 44.0 05/26/2021   MCV 87 05/26/2021   MCH 27.6 05/26/2021   RDW 14.4 05/26/2021   PLT 315 95/18/8416   Last metabolic panel Lab Results  Component Value Date   GLUCOSE 93 05/26/2021   NA 142 05/26/2021   K 4.1 05/26/2021   CL 106 05/26/2021   CO2 20 05/26/2021   BUN 16 05/26/2021   CREATININE 0.96 05/26/2021   EGFR 77 05/26/2021   CALCIUM 9.4 05/26/2021   PROT 7.0 05/26/2021   ALBUMIN 4.4 05/26/2021   LABGLOB 2.6 05/26/2021   AGRATIO 1.7 05/26/2021   BILITOT 0.4 05/26/2021   ALKPHOS 64 05/26/2021   AST 19 05/26/2021   ALT 22 05/26/2021   Last lipids Lab Results  Component Value Date   CHOL 186 06/03/2020   HDL 62 06/03/2020   LDLCALC 106 (H) 06/03/2020   TRIG 102 06/03/2020   CHOLHDL 3.1 01/30/2020   Last hemoglobin A1c Lab Results  Component Value Date   HGBA1C 5.7 (H) 05/26/2021   Last thyroid functions Lab Results  Component Value Date   TSH 1.040 05/26/2021   Last vitamin D Lab Results  Component Value Date   VD25OH 44.6 06/03/2020     Objective    BP (!) 138/96 (BP Location: Right Arm, Patient Position: Sitting, Cuff Size: Large)   Pulse 85   Temp 98.9 F (37.2 C) (Oral)   Resp 16  Ht '5\' 3"'  (1.6 m)   Wt 218 lb (98.9 kg)   SpO2 100%   BMI 38.62 kg/m  BP Readings from Last 3 Encounters:  09/01/21 (!) 138/96  08/20/21 (!) 130/94  08/12/21 (!) 136/91   Wt Readings from Last 3 Encounters:  09/01/21 218 lb (98.9 kg)  08/20/21 218 lb 7.3 oz (99.1 kg)  08/12/21 226 lb 9.6 oz (102.8 kg)   SpO2 Readings from Last 3 Encounters:  09/01/21 100%  08/20/21 100%  05/26/21 100%      Physical Exam    No results found for any visits on 09/01/21.  Assessment & Plan     Problem List Items Addressed This Visit       Cardiovascular and Mediastinum   Primary hypertension    Chronic, elevated; goal <140/<90 Continues to decline medication for DBP  elevation  Pt believes DBP elevation is due to insomnia and poor sleep patterns Recommend DASH diet and trial of HCTZ 25 mg QD when agreeable        Relevant Orders   Ambulatory referral to Neurology     Other   Daytime somnolence - Primary    Chronic, not improved Will remove Ambien 6.25 mg due to side effects Will refer back to neurology for further recommendations Continue to recommend balanced, lower carb meals. Smaller meal size, adding snacks. Choosing water as drink of choice and increasing purposeful exercise.       Relevant Orders   Ambulatory referral to Neurology   Morbid obesity ([place])    Body mass index is 38.62 kg/m. Discussed importance of healthy weight management Discussed diet and exercise Associated with HTN, depression, GERD, HLD, pre-DM      Relevant Orders   Ambulatory referral to Neurology   Snoring    Chronic, worsening with GERD and insomnia Up almost hourly Never got CPAP from sleep study which indicated mild sleep apnea; will resend for use Recommend repeat study if symptoms continue after use       Relevant Orders   Ambulatory referral to Neurology     Return in about 4 months (around 01/01/2022) for chonic disease management.      Vonna Kotyk, FNP, have reviewed all documentation for this visit. The documentation on 09/01/21 for the exam, diagnosis, procedures, and orders are all accurate and complete.    [name], [place] [phone] (phone) [phone] (fax)  [place]

## 2021-09-01 ENCOUNTER — Ambulatory Visit (INDEPENDENT_AMBULATORY_CARE_PROVIDER_SITE_OTHER): Payer: BC Managed Care – PPO | Admitting: Family Medicine

## 2021-09-01 VITALS — BP 138/96 | HR 85 | Temp 98.9°F | Resp 16 | Ht 63.0 in | Wt 218.0 lb

## 2021-09-01 DIAGNOSIS — I10 Essential (primary) hypertension: Secondary | ICD-10-CM | POA: Insufficient documentation

## 2021-09-01 DIAGNOSIS — R0683 Snoring: Secondary | ICD-10-CM | POA: Diagnosis not present

## 2021-09-01 DIAGNOSIS — R400 Somnolence: Secondary | ICD-10-CM | POA: Diagnosis not present

## 2021-09-01 MED ORDER — PANTOPRAZOLE SODIUM 40 MG PO TBEC: 40.0000 mg | DELAYED_RELEASE_TABLET | Freq: Two times a day (BID) | ORAL | 3 refills | Status: DC

## 2021-09-01 NOTE — Assessment & Plan Note (Addendum)
Chronic, worsening with GERD and insomnia Up almost hourly Patient ever received a CPAP machine following previous sleep study which indicated mild sleep apnea; however, at this time symptoms remain and patient would like to start CPAP therapy Recommend repeat study if symptoms continue after initial use of CPAP

## 2021-09-01 NOTE — Telephone Encounter (Signed)
Protonix 40mg BID 

## 2021-09-01 NOTE — Assessment & Plan Note (Signed)
Body mass index is 38.62 kg/m. Discussed importance of healthy weight management Discussed diet and exercise Associated with HTN, depression, GERD, HLD, pre-DM

## 2021-09-01 NOTE — Assessment & Plan Note (Addendum)
Chronic, elevated; goal <140/<90 Continues to decline medication for DBP elevation  Pt believes DBP elevation is due to insomnia and poor sleep patterns Recommend DASH diet and trial of HCTZ 25 mg QD when agreeable

## 2021-09-01 NOTE — Assessment & Plan Note (Signed)
Chronic, not improved Will remove Ambien 6.25 mg due to side effects Will refer back to neurology for further recommendations Continue to recommend balanced, lower carb meals. Smaller meal size, adding snacks. Choosing water as drink of choice and increasing purposeful exercise.

## 2021-09-06 ENCOUNTER — Other Ambulatory Visit: Payer: Self-pay | Admitting: Gastroenterology

## 2021-09-09 ENCOUNTER — Telehealth: Payer: Self-pay

## 2021-09-09 NOTE — Telephone Encounter (Signed)
Copied from CRM [id number]. Topic: General - Other >> Sep 09, 2021  3:47 PM Everette C wrote: Reason for CRM: Leanesha with [employer] has called to request an additional prescription for the patient's CPAP machine   [facility] would like for the new prescription to include settings   The new prescription can be faxed to [phone]   Please contact further when possible

## 2021-09-11 NOTE — Telephone Encounter (Signed)
New script was filled out and faxed.

## 2021-09-12 ENCOUNTER — Telehealth: Payer: Self-pay

## 2021-09-12 NOTE — Telephone Encounter (Signed)
Spoke with [employer] [facility] and Patient. Additional information has been added to chart and note faxed to [employer] [facility].

## 2021-09-29 NOTE — Telephone Encounter (Signed)
Coralee North calling from [employer] calling to request pressure settings. Also needing the pts correct DOB. Have not received any faxes. With settings. [phone] Fax- [phone]

## 2021-09-30 NOTE — Telephone Encounter (Signed)
Jo Soto is calling from [employer] calling to request pressure settings. Also needing the patient's correct DOB.   Has not received any faxes. With settings. [phone] Fax- [phone]     [phone] EXT [phone]  Fax- [phone]

## 2021-09-30 NOTE — Telephone Encounter (Signed)
Spoke with someone at [employer] for clarification. Working on doing the CPAP script on [place] so nothing is missed.

## 2021-10-14 ENCOUNTER — Telehealth: Payer: BC Managed Care – PPO | Admitting: Physician Assistant

## 2021-10-14 DIAGNOSIS — R1013 Epigastric pain: Secondary | ICD-10-CM

## 2021-10-14 DIAGNOSIS — R197 Diarrhea, unspecified: Secondary | ICD-10-CM | POA: Diagnosis not present

## 2021-10-14 NOTE — Progress Notes (Signed)
Because of upper abdominal/epigastric pain, I feel your condition warrants further evaluation and I recommend that you be seen in a face to face visit.   NOTE: There will be NO CHARGE for this eVisit   If you are having a true medical emergency please call 911.      For an urgent face to face visit, [facility] has seven urgent care centers for your convenience:     [facility] Health Urgent Care Center at [facility] Directions [phone] [address] [place], [place] [address]    [facility] Health Urgent Care Center [facility] Get Driving Directions [phone] [address] [place], [place] [address]  [facility] Health Urgent Care Center [facility] [place]) Get Driving Directions [phone] [address] [place],  [place]  [address]  [facility] Health Urgent Care Center [facility] - at [facility] Directions  [phone] [id number] [place],  [place] [address]   [facility] Health Urgent Care at [facility] Get Driving Directions [phone] [address] [place], [place] [address]   [facility] Health Urgent Care at [facility] Get Driving Directions  [phone] [address] [place], [place] [address]   [facility] Health Urgent Care at [facility] Directions [phone] [address] [place], [place] [address]  Your MyChart E-visit questionnaire answers were reviewed by a board certified advanced clinical practitioner to complete your personal care plan based on your specific symptoms.  Thank you for using e-Visits.

## 2021-11-14 DIAGNOSIS — G4733 Obstructive sleep apnea (adult) (pediatric): Secondary | ICD-10-CM | POA: Diagnosis not present

## 2021-11-14 DIAGNOSIS — R0683 Snoring: Secondary | ICD-10-CM | POA: Diagnosis not present

## 2021-11-14 DIAGNOSIS — R400 Somnolence: Secondary | ICD-10-CM | POA: Diagnosis not present

## 2021-11-17 ENCOUNTER — Encounter: Payer: Self-pay | Admitting: Gastroenterology

## 2021-11-17 ENCOUNTER — Ambulatory Visit (INDEPENDENT_AMBULATORY_CARE_PROVIDER_SITE_OTHER): Payer: BC Managed Care – PPO | Admitting: Gastroenterology

## 2021-11-17 ENCOUNTER — Other Ambulatory Visit: Payer: Self-pay

## 2021-11-17 ENCOUNTER — Telehealth: Payer: Self-pay | Admitting: Gastroenterology

## 2021-11-17 VITALS — BP 146/102 | HR 76 | Temp 98.2°F | Ht 63.0 in | Wt 220.6 lb

## 2021-11-17 DIAGNOSIS — R1084 Generalized abdominal pain: Secondary | ICD-10-CM | POA: Diagnosis not present

## 2021-11-17 MED ORDER — SUCRALFATE 1 G PO TABS: 1.0000 g | ORAL_TABLET | Freq: Three times a day (TID) | ORAL | 0 refills | Status: DC

## 2021-11-17 NOTE — Patient Instructions (Addendum)
Please pick up your prep kit today.  Please do not have anything to eat or drink 4 hours prior.  Please arrive 30 minutes prior to your CT Scan.

## 2021-11-17 NOTE — Telephone Encounter (Signed)
Patient can only be seen on Mondays, can you reschedule CT scan for a Monday and let the patient know the appointment time.

## 2021-11-17 NOTE — Progress Notes (Signed)
Jo Mood MD, MRCP(U.K) [address]  [address]  [place], [place] [address]  Main: [phone]  Fax: [phone]   Primary Care Physician: Jo Kindle, FNP  Primary Gastroenterologist:  Dr. Wyline Soto   Chief Complaint  Patient presents with   Dyspepsia    HPI: Jo Soto is a 41 y.o. female   Summary of history :   Initially referred and seen on 04/23/2020 for IBS.  She also has history of GERD, gastroparesis.  She has been issues with regurgitation, abdominal cramping right after she eats in the central part of her abdomen associated with a bowel movement.  Issues of heartburn in the past.  Had lost some weight.  On Prilosec 40 mg a day which did not help. 05/07/2020: HIDA scan: Ejection fraction 19%: Referred to Dr. Aleen Soto for cholecystectomy 05/06/2020: EGD: Food seen in the stomach moderate in quantity.  05/29/2020: Gastric emptying study: Normal 07/02/2020: Cholecystectomy: Pathology specimen demonstrated chronic cholecystitis    Interval history 08/12/2021-11/17/2021  08/20/2021: EGD: normal : bx showed gastritis  Still having pain when she eats for exposure to marijuana no NSAID use.  Denies any constipation not relieved by bowel movement. The pain is generalized all over her abdomen.  Denies any use of narcotics.  Already taking bupropion.  Not responded to twice daily PPI as well as dicyclomine.    Current Outpatient Medications  Medication Sig Dispense Refill   buPROPion (WELLBUTRIN XL) 300 MG 24 hr tablet Take 1 tablet by mouth daily.     levonorgestrel (MIRENA) 20 MCG/DAY IUD 1 each by Intrauterine route once.     Multiple Vitamin (MULTIVITAMIN WITH MINERALS) TABS tablet Take 1 tablet by mouth in the morning.     ondansetron (ZOFRAN-ODT) 4 MG disintegrating tablet Take 4 mg by mouth 3 (three) times daily.     dicyclomine (BENTYL) 10 MG capsule Take 1 capsule (10 mg total) by mouth 3 (three) times daily as needed for spasms. (Patient not taking:  Reported on 11/17/2021) 120 capsule 0   No current facility-administered medications for this visit.    Allergies as of 11/17/2021   (No Known Allergies)    ROS:  General: Negative for anorexia, weight loss, fever, chills, fatigue, weakness. ENT: Negative for hoarseness, difficulty swallowing , nasal congestion. CV: Negative for chest pain, angina, palpitations, dyspnea on exertion, peripheral edema.  Respiratory: Negative for dyspnea at rest, dyspnea on exertion, cough, sputum, wheezing.  GI: See history of present illness. GU:  Negative for dysuria, hematuria, urinary incontinence, urinary frequency, nocturnal urination.  Endo: Negative for unusual weight change.    Physical Examination:   BP (!) 146/102   Pulse 76   Temp 98.2 F (36.8 C) (Oral)   Ht 5\' 3"  (1.6 m)   Wt 220 lb 9.6 oz (100.1 kg)   BMI 39.08 kg/m   General: Well-nourished, well-developed in no acute distress.  Eyes: No icterus. Conjunctivae pink. Mouth: Oropharyngeal mucosa moist and pink , no lesions erythema or exudate. Lungs: Clear to auscultation bilaterally. Non-labored. Heart: Regular rate and rhythm, no murmurs rubs or gallops.  Abdomen: Bowel sounds are normal, nontender, nondistended, no hepatosplenomegaly or masses, no abdominal bruits or hernia , no rebound or guarding.   Extremities: No lower extremity edema. No clubbing or deformities. Neuro: Alert and oriented x 3.  Grossly intact. Skin: Warm and dry, no jaundice.   Psych: Alert and cooperative, normal Soto and affect.   Imaging Studies: No results found.  Assessment and Plan:  Jo Soto is a 41 y.o. y/o female here to follow-up for dyspepsia.  Not responded to PPI, Bentyl.  Possible underlying gastroparesis.  Already on bupropion.  Plan 1.  Trial of Carafate 4 times daily 2.  Gastroparesis diet 3.  CT abdomen Dr Jo Mood  MD,MRCP [facility]) Follow up in 3-4 months

## 2021-11-18 ENCOUNTER — Ambulatory Visit: Admission: RE | Admit: 2021-11-18 | Payer: BC Managed Care – PPO | Source: Ambulatory Visit

## 2021-11-18 ENCOUNTER — Telehealth: Payer: Self-pay

## 2021-11-18 NOTE — Telephone Encounter (Signed)
Called and did authorization over the phone at [phone] and did the prior authorization over the phone. They could not find the out patient imaging facility so I did [facility]. They denied the request and is needing a peer to peer at [phone] optional 1,then 1, then 7  Can you do a peer to peer please

## 2021-11-18 NOTE — Telephone Encounter (Signed)
Patient stated that she had rescheduled her CT Scan to a Monday-12/01/2021.

## 2021-11-19 ENCOUNTER — Other Ambulatory Visit: Payer: Self-pay | Admitting: Family Medicine

## 2021-11-19 DIAGNOSIS — F322 Major depressive disorder, single episode, severe without psychotic features: Secondary | ICD-10-CM

## 2021-12-01 ENCOUNTER — Ambulatory Visit: Payer: BC Managed Care – PPO

## 2021-12-01 DIAGNOSIS — F419 Anxiety disorder, unspecified: Secondary | ICD-10-CM | POA: Diagnosis not present

## 2021-12-01 DIAGNOSIS — F320 Major depressive disorder, single episode, mild: Secondary | ICD-10-CM | POA: Diagnosis not present

## 2021-12-01 DIAGNOSIS — G4701 Insomnia due to medical condition: Secondary | ICD-10-CM | POA: Diagnosis not present

## 2021-12-08 ENCOUNTER — Ambulatory Visit
Admission: RE | Admit: 2021-12-08 | Discharge: 2021-12-08 | Disposition: A | Discharge status: Home / Self Care | Payer: BC Managed Care – PPO | Source: Ambulatory Visit | Attending: Gastroenterology | Admitting: Gastroenterology

## 2021-12-08 DIAGNOSIS — R109 Unspecified abdominal pain: Secondary | ICD-10-CM | POA: Diagnosis not present

## 2021-12-08 DIAGNOSIS — R1084 Generalized abdominal pain: Secondary | ICD-10-CM | POA: Insufficient documentation

## 2021-12-08 MED ORDER — IOHEXOL 300 MG/ML  SOLN
100.0000 mL | Freq: Once | INTRAMUSCULAR | Status: AC | PRN
  Administered 2021-12-08: 100 mL via INTRAVENOUS

## 2021-12-10 ENCOUNTER — Telehealth: Payer: Self-pay

## 2021-12-10 NOTE — Telephone Encounter (Signed)
-----   Message from Jonathon Bellows, MD sent at 12/10/2021  8:20 AM EDT ----- No abnormality seen in ct abdomen

## 2021-12-10 NOTE — Progress Notes (Signed)
No abnormality seen in ct abdomen

## 2021-12-10 NOTE — Telephone Encounter (Signed)
Called patient to give her results of her CT Scan abdomen being normal. Patient was happy to hear and she did not have any questions.

## 2021-12-15 DIAGNOSIS — G4733 Obstructive sleep apnea (adult) (pediatric): Secondary | ICD-10-CM | POA: Diagnosis not present

## 2021-12-15 DIAGNOSIS — R400 Somnolence: Secondary | ICD-10-CM | POA: Diagnosis not present

## 2021-12-15 DIAGNOSIS — R0683 Snoring: Secondary | ICD-10-CM | POA: Diagnosis not present

## 2021-12-24 ENCOUNTER — Telehealth: Payer: Self-pay

## 2021-12-24 NOTE — Telephone Encounter (Signed)
Copied from [place] [id number]. Topic: General - Other >> Dec 23, 2021  4:38 PM Jo Soto wrote: Reason for CRM: Pt asked if the fax that she sent to [employer] was received. Pt stated she will bring the paperwork over to the office.

## 2021-12-30 NOTE — Telephone Encounter (Signed)
Pt stated FMLA forms were dropped off possibly about two weeks ago but have not been faxed to [facility]. Pt stated pts were supposed to be submitted by October 7th, and her claim has now been denied.    Pt is requesting a call back as soon as possible.

## 2021-12-31 NOTE — Telephone Encounter (Signed)
Spoke with patient, advised she needs an appt. Scheduled for 01/07/22

## 2022-01-07 ENCOUNTER — Encounter: Payer: Self-pay | Admitting: Family Medicine

## 2022-01-07 ENCOUNTER — Ambulatory Visit (INDEPENDENT_AMBULATORY_CARE_PROVIDER_SITE_OTHER): Payer: BC Managed Care – PPO | Admitting: Family Medicine

## 2022-01-07 VITALS — BP 158/111 | HR 70 | Resp 16 | Ht 63.0 in | Wt 231.0 lb

## 2022-01-07 DIAGNOSIS — R109 Unspecified abdominal pain: Secondary | ICD-10-CM | POA: Diagnosis not present

## 2022-01-07 DIAGNOSIS — I10 Essential (primary) hypertension: Secondary | ICD-10-CM | POA: Diagnosis not present

## 2022-01-07 DIAGNOSIS — G8929 Other chronic pain: Secondary | ICD-10-CM | POA: Insufficient documentation

## 2022-01-07 DIAGNOSIS — Z6841 Body Mass Index (BMI) 40.0 and over, adult: Secondary | ICD-10-CM

## 2022-01-07 DIAGNOSIS — R1013 Epigastric pain: Secondary | ICD-10-CM | POA: Insufficient documentation

## 2022-01-07 DIAGNOSIS — F339 Major depressive disorder, recurrent, unspecified: Secondary | ICD-10-CM | POA: Diagnosis not present

## 2022-01-07 MED ORDER — LOSARTAN POTASSIUM-HCTZ 100-25 MG PO TABS: 1.0000 | ORAL_TABLET | Freq: Every day | ORAL | 3 refills | Status: DC

## 2022-01-07 MED ORDER — SUCRALFATE 1 G PO TABS: 1.0000 g | ORAL_TABLET | Freq: Three times a day (TID) | ORAL | 11 refills | Status: DC

## 2022-01-07 NOTE — Assessment & Plan Note (Signed)
Chronic, unchanged with lifestyle management recommend start of Hyzaar to assist Will follow up at OSA compliance appt

## 2022-01-07 NOTE — Assessment & Plan Note (Signed)
Chronic, stable BMI >40 with HTN, Depression and OSA

## 2022-01-07 NOTE — Assessment & Plan Note (Signed)
Chronic, stable Continue to recommend balanced, lower carb meals. Smaller meal size, adding snacks. Choosing water as drink of choice and increasing purposeful exercise. Body mass index is 40.92 kg/m.

## 2022-01-07 NOTE — Progress Notes (Signed)
Established patient visit   Patient: Jo Soto   DOB: 28-Aug-1980   41 y.o. Female  MRN: [id number] Visit Date: 01/07/2022  Today's healthcare provider: [name], FNP  Introduced to nurse practitioner role and practice setting.  All questions answered.  Discussed provider/patient relationship and expectations.   I,Tiffany J Bragg,acting as a scribe for [name], FNP.,have documented all relevant documentation on the behalf of [name], FNP,as directed by  [name], FNP while in the presence of [name], FNP.   Chief Complaint  Patient presents with   Nausea    Patient    Subjective    HPI HPI     Nausea    Additional comments: Patient         Comments   Patient is here for FMLA forms to be filled out for GI issues involving nausea, stomach pain and vomiting.       Last edited by Smitty Knudsen, CMA on 01/07/2022  4:10 PM.       Medications: Outpatient Medications Prior to Visit  Medication Sig   buPROPion (WELLBUTRIN SR) 150 MG 12 hr tablet Take 150 mg by mouth daily.   levonorgestrel (MIRENA) 20 MCG/DAY IUD 1 each by Intrauterine route once.   Multiple Vitamin (MULTIVITAMIN WITH MINERALS) TABS tablet Take 1 tablet by mouth in the morning.   [DISCONTINUED] ondansetron (ZOFRAN-ODT) 4 MG disintegrating tablet Take 4 mg by mouth 3 (three) times daily.   [DISCONTINUED] buPROPion (WELLBUTRIN XL) 150 MG 24 hr tablet TAKE 2 TABLETS BY MOUTH DAILY.   [DISCONTINUED] dicyclomine (BENTYL) 10 MG capsule Take 1 capsule (10 mg total) by mouth 3 (three) times daily as needed for spasms. (Patient not taking: Reported on 11/17/2021)   [DISCONTINUED] sucralfate (CARAFATE) 1 g tablet Take 1 tablet (1 g total) by mouth 4 (four) times daily -  with meals and at bedtime for 15 days.   No facility-administered medications prior to visit.    Review of Systems   Objective    BP (!) 158/111 (BP Location: Left Wrist, Patient Position: Sitting, Cuff  Size: Normal)   Pulse 70   Resp 16   Ht 5\' 3"  (1.6 m)   Wt 231 lb (104.8 kg)   SpO2 99%   BMI 40.92 kg/m   Physical Exam Vitals and nursing note reviewed.  Constitutional:      General: She is not in acute distress.    Appearance: Normal appearance. She is obese. She is not ill-appearing, toxic-appearing or diaphoretic.  HENT:     Head: Normocephalic and atraumatic.  Cardiovascular:     Rate and Rhythm: Normal rate and regular rhythm.     Pulses: Normal pulses.     Heart sounds: Normal heart sounds. No murmur heard.    No friction rub. No gallop.  Pulmonary:     Effort: Pulmonary effort is normal. No respiratory distress.     Breath sounds: Normal breath sounds. No stridor. No wheezing, rhonchi or rales.  Chest:     Chest wall: No tenderness.  Abdominal:     General: Bowel sounds are normal. There is no distension.     Palpations: Abdomen is soft. There is no mass.     Tenderness: There is no abdominal tenderness. There is no guarding or rebound.     Hernia: No hernia is present.  Musculoskeletal:        General: No swelling, tenderness, deformity or signs of injury. Normal  range of motion.     Right lower leg: No edema.     Left lower leg: No edema.  Skin:    General: Skin is warm and dry.     Capillary Refill: Capillary refill takes less than 2 seconds.     Coloration: Skin is not jaundiced or pale.     Findings: No bruising, erythema, lesion or rash.  Neurological:     General: No focal deficit present.     Mental Status: She is alert and oriented to person, place, and time. Mental status is at baseline.     Cranial Nerves: No cranial nerve deficit.     Sensory: No sensory deficit.     Motor: No weakness.     Coordination: Coordination normal.  Psychiatric:        Mood and Affect: Mood normal.        Behavior: Behavior normal.        Thought Content: Thought content normal.        Judgment: Judgment normal.     No results found for any visits on 01/07/22.   Assessment & Plan     Problem List Items Addressed This Visit       Cardiovascular and Mediastinum   Primary hypertension    Chronic, unchanged with lifestyle management recommend start of Hyzaar to assist Will follow up at OSA compliance appt       Relevant Medications   losartan-hydrochlorothiazide (HYZAAR) 100-25 MG tablet     Other   Chronic abdominal pain - Primary    Chronic, stable Discussed normal results from CT scan last month Request for additional FMLA paperwork given chronic nature with flares       Relevant Medications   buPROPion (WELLBUTRIN SR) 150 MG 12 hr tablet   sucralfate (CARAFATE) 1 g tablet   Class 3 severe obesity due to excess calories without serious comorbidity with body mass index (BMI) of 40.0 to 44.9 in adult [facility])    Chronic, stable Continue to recommend balanced, lower carb meals. Smaller meal size, adding snacks. Choosing water as drink of choice and increasing purposeful exercise. Body mass index is 40.92 kg/m.       Depression, recurrent (HCC)    Chronic, stable Continues on Wellbutrin 150 mg daily      Relevant Medications   buPROPion (WELLBUTRIN SR) 150 MG 12 hr tablet   Morbid obesity (HCC)    Chronic, stable BMI >40 with HTN, Depression and OSA        Return in about 4 weeks (around 02/04/2022) for OSA compliance, HTN.      I, [name], FNP, have reviewed all documentation for this visit. The documentation on 01/07/22 for the exam, diagnosis, procedures, and orders are all accurate and complete.    [name], [place] [phone] (phone) [phone] (fax)  [place]

## 2022-01-07 NOTE — Assessment & Plan Note (Signed)
Chronic, stable Discussed normal results from CT scan last month Request for additional FMLA paperwork given chronic nature with flares

## 2022-01-07 NOTE — Assessment & Plan Note (Signed)
Chronic, stable Continues on Wellbutrin 150 mg daily

## 2022-01-14 DIAGNOSIS — R400 Somnolence: Secondary | ICD-10-CM | POA: Diagnosis not present

## 2022-01-14 DIAGNOSIS — G4733 Obstructive sleep apnea (adult) (pediatric): Secondary | ICD-10-CM | POA: Diagnosis not present

## 2022-01-14 DIAGNOSIS — R0683 Snoring: Secondary | ICD-10-CM | POA: Diagnosis not present

## 2022-01-15 NOTE — Telephone Encounter (Signed)
Markus with [employer] called saying they have not received all of the paperwork for the patient being out of work.  They are missing some of the questions being answered.  CB@  [phone]

## 2022-01-15 NOTE — Progress Notes (Signed)
Complete physical exam   Patient: Jo Soto   DOB: [birth year]   41 y.o. Female  MRN: [id number] Visit Date: 01/19/2022  Today's healthcare provider: Jacky Kindle, FNP  Re Introduced to nurse practitioner role and practice setting.  All questions answered.  Discussed provider/patient relationship and expectations.   I,Tiffany J Bragg,acting as a scribe for Jacky Kindle, FNP.,have documented all relevant documentation on the behalf of Jacky Kindle, FNP,as directed by  Jacky Kindle, FNP while in the presence of Jacky Kindle, FNP.   Chief Complaint  Patient presents with   Annual Exam   Subjective    Jo Soto is a 41 y.o. female who presents today for a complete physical exam.  She reports consuming a general diet. The patient does not participate in regular exercise at present. She generally feels well. She reports sleeping poorly. She does have additional problems to discuss today.   HPI   Past Medical History:  Diagnosis Date   Allergy    Anxiety    Depression    GERD (gastroesophageal reflux disease)    Heartburn    Migraines    Sleep apnea    Past Surgical History:  Procedure Laterality Date   ANTERIOR CRUCIATE LIGAMENT REPAIR  12/2017   CHOLECYSTECTOMY     ESOPHAGOGASTRODUODENOSCOPY (EGD) WITH PROPOFOL N/A 05/06/2020   Procedure: ESOPHAGOGASTRODUODENOSCOPY (EGD) WITH PROPOFOL;  Surgeon: Wyline Mood, MD;  Location: [facility] ENDOSCOPY;  Service: Gastroenterology;  Laterality: N/A;  COVID POSITIVE 04/03/2020   ESOPHAGOGASTRODUODENOSCOPY (EGD) WITH PROPOFOL N/A 08/20/2021   Procedure: ESOPHAGOGASTRODUODENOSCOPY (EGD) WITH PROPOFOL;  Surgeon: Wyline Mood, MD;  Location: [facility] ENDOSCOPY;  Service: Gastroenterology;  Laterality: N/A;   HERNIA REPAIR     ROBOTIC ASSISTED LAPAROSCOPIC CHOLECYSTECTOMY  07/02/2020   TUMOR REMOVAL  12/2014   Abdominal   XI ROBOTIC ASSISTED VENTRAL HERNIA N/A 03/11/2021   Procedure: XI ROBOTIC ASSISTED VENTRAL HERNIA, incisional  hernia repair with mesh;  Surgeon: Henrene Dodge, MD;  Location: [facility] ORS;  Service: General;  Laterality: N/A;   Social History   Socioeconomic History   Marital status: Divorced    Spouse name: Not on file   Number of children: Not on file   Years of education: Not on file   Highest education level: Not on file  Occupational History    Employer: [employer]  Tobacco Use   Smoking status: Some Days    Packs/day: 0.10    Years: 8.00    Total pack years: 0.80    Types: Cigarettes    Passive exposure: Never   Smokeless tobacco: Never  Vaping Use   Vaping Use: Never used  Substance and Sexual Activity   Alcohol use: Yes    Alcohol/week: 9.0 standard drinks of alcohol    Types: 2 Glasses of wine, 6 Cans of beer, 1 Shots of liquor per week    Comment: 2-3 weekly   Drug use: Never   Sexual activity: Yes  Other Topics Concern   Not on file  Social History Narrative   Not on file   Social Determinants of Health   Financial Resource Strain: Not on file  Food Insecurity: Not on file  Transportation Needs: Not on file  Physical Activity: Not on file  Stress: Not on file  Social Connections: Not on file  Intimate Partner Violence: Not on file   Family Status  Relation Name Status   Mother  (Not Specified)   Sister  (Not Specified)   Youth worker  (  Not Specified)   Mat Uncle  (Not Specified)   Family History  Problem Relation Age of Onset   Anemia Mother    Cancer Mother    Diabetes Mother    Heart disease Sister    Diabetes Maternal Aunt    Diabetes Maternal Uncle    No Known Allergies  Patient Care Team: Jacky KindlePayne, Jahmir Salo T, FNP as PCP - General (Family Medicine)   Medications: Outpatient Medications Prior to Visit  Medication Sig   buPROPion (WELLBUTRIN SR) 150 MG 12 hr tablet Take 150 mg by mouth daily.   levonorgestrel (MIRENA) 20 MCG/DAY IUD 1 each by Intrauterine route once.   losartan-hydrochlorothiazide (HYZAAR) 100-25 MG tablet Take 1 tablet by mouth daily.    Multiple Vitamin (MULTIVITAMIN WITH MINERALS) TABS tablet Take 1 tablet by mouth in the morning.   sucralfate (CARAFATE) 1 g tablet Take 1 tablet (1 g total) by mouth 4 (four) times daily -  with meals and at bedtime.   No facility-administered medications prior to visit.    Review of Systems  HENT:  Positive for sinus pressure.   Musculoskeletal:  Positive for back pain.    Objective    BP (!) 125/90 (BP Location: Left Arm, Patient Position: Sitting, Cuff Size: Large)   Pulse 76   Temp 98.5 F (36.9 C) (Oral)   Resp 16   Ht 5\' 3"  (1.6 m)   Wt 223 lb (101.2 kg)   SpO2 98%   BMI 39.50 kg/m   Physical Exam Vitals and nursing note reviewed.  Constitutional:      General: She is awake. She is not in acute distress.    Appearance: Normal appearance. She is well-developed and well-groomed. She is obese. She is not ill-appearing, toxic-appearing or diaphoretic.  HENT:     Head: Normocephalic and atraumatic.     Jaw: There is normal jaw occlusion. No trismus, tenderness, swelling or pain on movement.     Right Ear: Hearing, tympanic membrane, ear canal and external ear normal. There is no impacted cerumen.     Left Ear: Hearing, tympanic membrane, ear canal and external ear normal. There is no impacted cerumen.     Nose: Nose normal. No congestion or rhinorrhea.     Right Turbinates: Not enlarged, swollen or pale.     Left Turbinates: Not enlarged, swollen or pale.     Right Sinus: No maxillary sinus tenderness or frontal sinus tenderness.     Left Sinus: No maxillary sinus tenderness or frontal sinus tenderness.     Mouth/Throat:     Lips: Pink.     Mouth: Mucous membranes are moist. No injury.     Tongue: No lesions.     Pharynx: Oropharynx is clear. Uvula midline. No pharyngeal swelling, oropharyngeal exudate, posterior oropharyngeal erythema or uvula swelling.     Tonsils: No tonsillar exudate or tonsillar abscesses.  Eyes:     General: Lids are normal. Lids are everted, no  foreign bodies appreciated. Vision grossly intact. Gaze aligned appropriately. No allergic shiner or visual field deficit.       Right eye: No discharge.        Left eye: No discharge.     Extraocular Movements: Extraocular movements intact.     Conjunctiva/sclera: Conjunctivae normal.     Right eye: Right conjunctiva is not injected. No exudate.    Left eye: Left conjunctiva is not injected. No exudate.    Pupils: Pupils are equal, round, and reactive to light.  Neck:  Thyroid: No thyroid mass, thyromegaly or thyroid tenderness.     Vascular: No carotid bruit.     Trachea: Trachea normal.  Cardiovascular:     Rate and Rhythm: Normal rate and regular rhythm.     Pulses: Normal pulses.          Carotid pulses are 2+ on the right side and 2+ on the left side.      Radial pulses are 2+ on the right side and 2+ on the left side.       Dorsalis pedis pulses are 2+ on the right side and 2+ on the left side.       Posterior tibial pulses are 2+ on the right side and 2+ on the left side.     Heart sounds: Normal heart sounds, S1 normal and S2 normal. No murmur heard.    No friction rub. No gallop.  Pulmonary:     Effort: Pulmonary effort is normal. No respiratory distress.     Breath sounds: Normal breath sounds and air entry. No stridor. No wheezing, rhonchi or rales.  Chest:     Chest wall: No tenderness.  Abdominal:     General: Abdomen is flat. Bowel sounds are normal. There is no distension.     Palpations: Abdomen is soft. There is no mass.     Tenderness: There is no abdominal tenderness. There is no right CVA tenderness, left CVA tenderness, guarding or rebound.     Hernia: No hernia is present.  Genitourinary:    Comments: Exam deferred; denies complaints Musculoskeletal:        General: No swelling, tenderness, deformity or signs of injury. Normal range of motion.     Cervical back: Full passive range of motion without pain, normal range of motion and neck supple. No edema,  rigidity or tenderness. No muscular tenderness.     Right lower leg: No edema.     Left lower leg: No edema.  Lymphadenopathy:     Cervical: No cervical adenopathy.     Right cervical: No superficial, deep or posterior cervical adenopathy.    Left cervical: No superficial, deep or posterior cervical adenopathy.  Skin:    General: Skin is warm and dry.     Capillary Refill: Capillary refill takes less than 2 seconds.     Coloration: Skin is not jaundiced or pale.     Findings: No bruising, erythema, lesion or rash.  Neurological:     General: No focal deficit present.     Mental Status: She is alert and oriented to person, place, and time. Mental status is at baseline.     GCS: GCS eye subscore is 4. GCS verbal subscore is 5. GCS motor subscore is 6.     Sensory: Sensation is intact. No sensory deficit.     Motor: Motor function is intact. No weakness.     Coordination: Coordination is intact. Coordination normal.     Gait: Gait is intact. Gait normal.  Psychiatric:        Attention and Perception: Attention and perception normal.        Mood and Affect: Mood normal. Affect is flat.        Speech: Speech normal.        Behavior: Behavior normal. Behavior is cooperative.        Thought Content: Thought content normal.        Cognition and Memory: Cognition and memory normal.        Judgment: Judgment normal.  Last depression screening scores    01/19/2022    8:42 AM 01/07/2022    4:15 PM 09/01/2021    3:07 PM  PHQ 2/9 Scores  PHQ - 2 Score 1 2 3   PHQ- 9 Score 4 7 12    Last fall risk screening    01/19/2022    8:42 AM  Fall Risk   Falls in the past year? 0  Number falls in past yr: 0  Injury with Fall? 0  Risk for fall due to : No Fall Risks  Follow up Falls evaluation completed   Last Audit-C alcohol use screening    01/19/2022    8:42 AM  Alcohol Use Disorder Test (AUDIT)  1. How often do you have a drink containing alcohol? 3  2. How many drinks containing  alcohol do you have on a typical day when you are drinking? 0  3. How often do you have six or more drinks on one occasion? 3  AUDIT-C Score 6   A score of 3 or more in women, and 4 or more in men indicates increased risk for alcohol abuse, EXCEPT if all of the points are from question 1   No results found for any visits on 01/19/22.  Assessment & Plan    Routine Health Maintenance and Physical Exam  Exercise Activities and Dietary recommendations  Goals      Quit Smoking        Immunization History  Administered Date(s) Administered   Influenza,inj,Quad PF,6+ Mos 05/26/2021, 01/19/2022    Health Maintenance  Topic Date Due   COVID-19 Vaccine (1) Never done   TETANUS/TDAP  Never done   PAP SMEAR-Modifier  06/09/2022   Hepatitis C Screening  Completed   HIV Screening  Completed   HPV VACCINES  Aged Out   INFLUENZA VACCINE  Discontinued    Discussed health benefits of physical activity, and encouraged her to engage in regular exercise appropriate for her age and condition.  Problem List Items Addressed This Visit       Cardiovascular and Mediastinum   Primary hypertension    Chronic, improved however, not at goal Continue to work on lifestyle management including heart healthy diet and purposeful exercise to assist hyzaar 100-25 Goal <140/<90 given current pre-diabetes; if diabetes advances to type 2 diabetes- goal of <130/<80 Continue to recommend smoking cessation to assist HTN control        Respiratory   OSA on CPAP    Set up with AirSense 11 on 11/14/21 Has AirFir n30 mask Pressure setting, auto-titrate 4-20  Patient is non complaint with use of therapy for more than 4 hours per night on 56% of nights during a consecutive 30-day period during the first 3 months of initial usage.         Other   Annual physical exam - Primary    Discuss maintenance visits for Dentist and Eye doctor Things to do to keep yourself healthy  - Exercise at least 30-45 minutes  a day, 3-4 days a week.  - Eat a low-fat diet with lots of fruits and vegetables, up to 7-9 servings per day.  - Seatbelts can save your life. Wear them always.  - Smoke detectors on every level of your home, check batteries every year.  - Eye Doctor - have an eye exam every 1-2 years  - Safe sex - if you may be exposed to STDs, use a condom.  - Alcohol -  If you drink, do it moderately,  less than 2 drinks per day.  - Health Care Power of Attorney. Choose someone to speak for you if you are not able.  - Depression is common in our stressful world.If you're feeling down or losing interest in things you normally enjoy, please come in for a visit.  - Violence - If anyone is threatening or hurting you, please call immediately.        Relevant Orders   CBC with Differential/Platelet   Comprehensive Metabolic Panel (CMET)   Lipid panel   TSH + free T4   Avitaminosis D    Chronic, previously stable Repeat given ongoing depression       Relevant Orders   Vitamin D (25 hydroxy)   Depression, recurrent (HCC)    Chronic, recurrent Continue wellbutrin to assist Reports poor sleep patterns, chronically as well as ongoing abdominal and back pain Denies SI or HI; poor focus during exam, frequently on cellular       Need for influenza vaccination   Relevant Orders   Flu Vaccine QUAD 6+ mos PF IM (Fluarix Quad PF) (Completed)   Personal history of nicotine dependence    Chronic, stable Continues to decline use of assistance outside of wellbutrin to reduce/stop nicotine use       Prediabetes    Chronic, previously controlled with diet/exericse Repeat A1c Continue to recommend balanced, lower carb meals. Smaller meal size, adding snacks. Choosing water as drink of choice and increasing purposeful exercise.       Relevant Orders   Hemoglobin A1c   Return in about 1 year (around 01/20/2023) for annual examination.    Leilani Merl, FNP, have reviewed all documentation for this  visit. The documentation on 01/19/22 for the exam, diagnosis, procedures, and orders are all accurate and complete.  Jacky Kindle, FNP  [facility] [phone] (phone) [phone] (fax)  [facility]

## 2022-01-19 ENCOUNTER — Ambulatory Visit (INDEPENDENT_AMBULATORY_CARE_PROVIDER_SITE_OTHER): Payer: BC Managed Care – PPO | Admitting: Family Medicine

## 2022-01-19 ENCOUNTER — Encounter: Payer: Self-pay | Admitting: Family Medicine

## 2022-01-19 VITALS — BP 125/90 | HR 76 | Temp 98.5°F | Resp 16 | Ht 63.0 in | Wt 223.0 lb

## 2022-01-19 DIAGNOSIS — Z23 Encounter for immunization: Secondary | ICD-10-CM | POA: Diagnosis not present

## 2022-01-19 DIAGNOSIS — I10 Essential (primary) hypertension: Secondary | ICD-10-CM | POA: Diagnosis not present

## 2022-01-19 DIAGNOSIS — Z0000 Encounter for general adult medical examination without abnormal findings: Secondary | ICD-10-CM | POA: Diagnosis not present

## 2022-01-19 DIAGNOSIS — F339 Major depressive disorder, recurrent, unspecified: Secondary | ICD-10-CM | POA: Diagnosis not present

## 2022-01-19 DIAGNOSIS — G4733 Obstructive sleep apnea (adult) (pediatric): Secondary | ICD-10-CM | POA: Diagnosis not present

## 2022-01-19 DIAGNOSIS — E559 Vitamin D deficiency, unspecified: Secondary | ICD-10-CM | POA: Insufficient documentation

## 2022-01-19 DIAGNOSIS — Z87891 Personal history of nicotine dependence: Secondary | ICD-10-CM | POA: Insufficient documentation

## 2022-01-19 DIAGNOSIS — R7303 Prediabetes: Secondary | ICD-10-CM | POA: Insufficient documentation

## 2022-01-19 NOTE — Assessment & Plan Note (Signed)
Chronic, previously stable Repeat given ongoing depression

## 2022-01-19 NOTE — Assessment & Plan Note (Signed)
Chronic, previously controlled with diet/exericse Repeat A1c Continue to recommend balanced, lower carb meals. Smaller meal size, adding snacks. Choosing water as drink of choice and increasing purposeful exercise.

## 2022-01-19 NOTE — Assessment & Plan Note (Signed)
Discuss maintenance visits for Dentist and Eye doctor Things to do to keep yourself healthy  - Exercise at least 30-45 minutes a day, 3-4 days a week.  - Eat a low-fat diet with lots of fruits and vegetables, up to 7-9 servings per day.  - Seatbelts can save your life. Wear them always.  - Smoke detectors on every level of your home, check batteries every year.  - Eye Doctor - have an eye exam every 1-2 years  - Safe sex - if you may be exposed to STDs, use a condom.  - Alcohol -  If you drink, do it moderately, less than 2 drinks per day.  - [place]. Choose someone to speak for you if you are not able.  - Depression is common in our stressful world.If you're feeling down or losing interest in things you normally enjoy, please come in for a visit.  - Violence - If anyone is threatening or hurting you, please call immediately.

## 2022-01-19 NOTE — Assessment & Plan Note (Addendum)
Set up with AirSense 11 on 11/14/21 Has AirFir n30 mask Pressure setting, auto-titrate 4-20  Patient is non complaint with use of therapy for more than 4 hours per night on 56% of nights during a consecutive 30-day period during the first 3 months of initial usage.

## 2022-01-19 NOTE — Assessment & Plan Note (Signed)
Chronic, improved however, not at goal Continue to work on lifestyle management including heart healthy diet and purposeful exercise to assist hyzaar 100-25 Goal <140/<90 given current pre-diabetes; if diabetes advances to type 2 diabetes- goal of <130/<80 Continue to recommend smoking cessation to assist HTN control

## 2022-01-19 NOTE — Assessment & Plan Note (Signed)
Chronic, stable Continues to decline use of assistance outside of wellbutrin to reduce/stop nicotine use

## 2022-01-19 NOTE — Assessment & Plan Note (Signed)
Chronic, recurrent Continue wellbutrin to assist Reports poor sleep patterns, chronically as well as ongoing abdominal and back pain Denies SI or HI; poor focus during exam, frequently on cellular

## 2022-01-20 LAB — CBC WITH DIFFERENTIAL/PLATELET
Basophils Absolute: 0 10*3/uL (ref 0.0–0.2)
Basos: 1 %
EOS (ABSOLUTE): 0.1 10*3/uL (ref 0.0–0.4)
Eos: 2 %
Hematocrit: 38.6 % (ref 34.0–46.6)
Hemoglobin: 12.3 g/dL (ref 11.1–15.9)
Immature Grans (Abs): 0 10*3/uL (ref 0.0–0.1)
Immature Granulocytes: 0 %
Lymphocytes Absolute: 2.4 10*3/uL (ref 0.7–3.1)
Lymphs: 49 %
MCH: 28.3 pg (ref 26.6–33.0)
MCHC: 31.9 g/dL (ref 31.5–35.7)
MCV: 89 fL (ref 79–97)
Monocytes Absolute: 0.4 10*3/uL (ref 0.1–0.9)
Monocytes: 9 %
Neutrophils Absolute: 1.9 10*3/uL (ref 1.4–7.0)
Neutrophils: 39 %
Platelets: 300 10*3/uL (ref 150–450)
RBC: 4.34 x10E6/uL (ref 3.77–5.28)
RDW: 14.2 % (ref 11.7–15.4)
WBC: 4.9 10*3/uL (ref 3.4–10.8)

## 2022-01-20 LAB — COMPREHENSIVE METABOLIC PANEL
ALT: 18 IU/L (ref 0–32)
AST: 17 IU/L (ref 0–40)
Albumin/Globulin Ratio: 1.8 (ref 1.2–2.2)
Albumin: 4.2 g/dL (ref 3.9–4.9)
Alkaline Phosphatase: 58 IU/L (ref 44–121)
BUN/Creatinine Ratio: 10 (ref 9–23)
BUN: 13 mg/dL (ref 6–24)
Bilirubin Total: <0.2 mg/dL (ref 0.0–1.2)
CO2: 18 mmol/L — ABNORMAL LOW (ref 20–29)
Calcium: 8.8 mg/dL (ref 8.7–10.2)
Chloride: 113 mmol/L — ABNORMAL HIGH (ref 96–106)
Creatinine, Ser: 1.29 mg/dL — ABNORMAL HIGH (ref 0.57–1.00)
Globulin, Total: 2.4 g/dL (ref 1.5–4.5)
Glucose: 84 mg/dL (ref 70–99)
Potassium: 3.9 mmol/L (ref 3.5–5.2)
Sodium: 144 mmol/L (ref 134–144)
Total Protein: 6.6 g/dL (ref 6.0–8.5)
eGFR: 54 mL/min/{1.73_m2} — ABNORMAL LOW (ref >59)

## 2022-01-20 LAB — TSH+FREE T4
Free T4: 0.98 ng/dL (ref 0.82–1.77)
TSH: 1.4 u[IU]/mL (ref 0.450–4.500)

## 2022-01-20 LAB — LIPID PANEL
Chol/HDL Ratio: 3.1 ratio (ref 0.0–4.4)
Cholesterol, Total: 188 mg/dL (ref 100–199)
HDL: 61 mg/dL (ref >39)
LDL Chol Calc (NIH): 105 mg/dL — ABNORMAL HIGH (ref 0–99)
Triglycerides: 126 mg/dL (ref 0–149)
VLDL Cholesterol Cal: 22 mg/dL (ref 5–40)

## 2022-01-20 LAB — HEMOGLOBIN A1C
Est. average glucose Bld gHb Est-mCnc: 117 mg/dL
Hgb A1c MFr Bld: 5.7 % — ABNORMAL HIGH (ref 4.8–5.6)

## 2022-01-20 LAB — VITAMIN D 25 HYDROXY (VIT D DEFICIENCY, FRACTURES): Vit D, 25-Hydroxy: 19.4 ng/mL — ABNORMAL LOW (ref 30.0–100.0)

## 2022-01-21 ENCOUNTER — Other Ambulatory Visit: Payer: Self-pay | Admitting: Family Medicine

## 2022-01-21 MED ORDER — VITAMIN D (ERGOCALCIFEROL) 1.25 MG (50000 UNIT) PO CAPS: 50000.0000 [IU] | ORAL_CAPSULE | ORAL | 0 refills | Status: DC

## 2022-01-21 NOTE — Progress Notes (Signed)
Blood chemistry shows return of elevated creatinine; continue to ensure adequate hydration with water to support kidney function. Goal of 48-64 oz.  Cholesterol remains stable; elevated bad/LDL cholesterol remains slightly elevated. The 10-year ASCVD risk score (Arnett DK, et al., 2019) is: 1.9%   Values used to calculate the score:     Age: 41 years     Sex: Female     Is Non-Hispanic African American: Yes     Diabetic: No     Tobacco smoker: Yes     Systolic Blood Pressure: 938 mmHg     Is BP treated: Yes     HDL Cholesterol: 61 mg/dL     Total Cholesterol: 188 mg/dL  Pre-diabetes remains; Continue to recommend balanced, lower carb meals. Smaller meal size, adding snacks. Choosing water as drink of choice and increasing purposeful exercise.  Continue to recommend Vit D supplement; either 5000 IU daily or high dose Rx strength once/week.  All other labs normal/stable.  [name], [address] [place], [place] [address] [phone] (phone) [phone] (fax) [place]

## 2022-01-30 ENCOUNTER — Telehealth: Payer: Self-pay

## 2022-01-30 NOTE — Telephone Encounter (Signed)
Faxed

## 2022-01-30 NOTE — Telephone Encounter (Signed)
Copied from CRM [id number]. Topic: General - Other >> Jan 28, 2022 11:13 AM Macon Large wrote: Reason for CRM: Stacy with [facility] stated they did not receive questions 1-4 and asked that the paper with questions 1-4 be faxed to [phone]

## 2022-02-02 ENCOUNTER — Telehealth: Payer: BC Managed Care – PPO | Admitting: Family Medicine

## 2022-02-02 NOTE — Progress Notes (Unsigned)
MyChart Video Visit    Virtual Visit via Video Note   This visit type was conducted due to national recommendations for restrictions regarding the COVID-19 Pandemic (e.g. social distancing) in an effort to limit this patient's exposure and mitigate transmission in our community. This patient is at least at moderate risk for complications without adequate follow up. This format is felt to be most appropriate for this patient at this time. Physical exam was limited by quality of the video and audio technology used for the visit.   Patient location: did not disclose Provider location: [facility] [address]  [address] [place], [place] [address]   I discussed the limitations of evaluation and management by telemedicine and the availability of in person appointments. The patient expressed understanding and agreed to proceed.  Patient: Jo Soto   DOB: 07/09/1980   41 y.o. Female  MRN: [id number] Visit Date: 02/04/2022  Today's healthcare provider: Jacky Kindle, FNP  Re Introduced to nurse practitioner role and practice setting.  All questions answered.  Discussed provider/patient relationship and expectations.  I,Ellisa Devivo J Adalynd Donahoe,acting as a scribe for Jacky Kindle, FNP.,have documented all relevant documentation on the behalf of Jacky Kindle, FNP,as directed by  Jacky Kindle, FNP while in the presence of Jacky Kindle, FNP.   Chief Complaint  Patient presents with   Follow-up    Patient does not know what the visit is for   Subjective    HPI HPI     Follow-up    Additional comments: Patient does not know what the visit is for      Last edited by Storm Sovine, Franchot Mimes, CMA on 02/04/2022  4:01 PM.      Medications: Outpatient Medications Prior to Visit  Medication Sig   buPROPion (WELLBUTRIN SR) 150 MG 12 hr tablet Take 150 mg by mouth daily.   levonorgestrel (MIRENA) 20 MCG/DAY IUD 1 each by Intrauterine route once.   losartan-hydrochlorothiazide  (HYZAAR) 100-25 MG tablet Take 1 tablet by mouth daily.   Multiple Vitamin (MULTIVITAMIN WITH MINERALS) TABS tablet Take 1 tablet by mouth in the morning.   sucralfate (CARAFATE) 1 g tablet Take 1 tablet (1 g total) by mouth 4 (four) times daily -  with meals and at bedtime.   Vitamin D, Ergocalciferol, (DRISDOL) 1.25 MG (50000 UNIT) CAPS capsule Take 1 capsule (50,000 Units total) by mouth every 7 (seven) days. Take with fat containing meal; repeat labs in 6 months.   No facility-administered medications prior to visit.    Review of Systems   Objective    There were no vitals taken for this visit.  Physical Exam Constitutional:      Appearance: Normal appearance. She is obese.  Pulmonary:     Effort: Pulmonary effort is normal.  Neurological:     Mental Status: She is alert.  Psychiatric:        Mood and Affect: Mood normal.        Behavior: Behavior normal.        Thought Content: Thought content normal.        Judgment: Judgment normal.      Assessment & Plan     Problem List Items Addressed This Visit       Respiratory   OSA on CPAP - Primary    Encouraged patient to download her history to get compliance log for insurance coverage; reports concern for leak at hose attachment. Plans to call [facility] medical supply to  discuss re hose replacement.         I discussed the assessment and treatment plan with the patient. The patient was provided an opportunity to ask questions and all were answered. The patient agreed with the plan and demonstrated an understanding of the instructions.   The patient was advised to call back or seek an in-person evaluation if the symptoms worsen or if the condition fails to improve as anticipated.  I provided 10 minutes of face-to-face time during this encounter discussing need for download for CPAP compliance.  Leilani Merl, FNP, have reviewed all documentation for this visit. The documentation on 02/04/22 for the exam, diagnosis,  procedures, and orders are all accurate and complete.   Jacky Kindle, FNP [facility] [phone] (phone) [phone] (fax)  [facility]

## 2022-02-04 ENCOUNTER — Telehealth (INDEPENDENT_AMBULATORY_CARE_PROVIDER_SITE_OTHER): Payer: BC Managed Care – PPO | Admitting: Family Medicine

## 2022-02-04 DIAGNOSIS — G4733 Obstructive sleep apnea (adult) (pediatric): Secondary | ICD-10-CM | POA: Diagnosis not present

## 2022-02-04 NOTE — Assessment & Plan Note (Signed)
Encouraged patient to download her history to get compliance log for insurance coverage; reports concern for leak at hose attachment. Plans to call [facility] to discuss re hose replacement.

## 2022-02-28 ENCOUNTER — Other Ambulatory Visit: Payer: Self-pay | Admitting: Family Medicine

## 2022-02-28 DIAGNOSIS — G8929 Other chronic pain: Secondary | ICD-10-CM

## 2022-03-02 ENCOUNTER — Encounter: Payer: Self-pay | Admitting: Gastroenterology

## 2022-03-02 ENCOUNTER — Ambulatory Visit (INDEPENDENT_AMBULATORY_CARE_PROVIDER_SITE_OTHER): Payer: BC Managed Care – PPO | Admitting: Gastroenterology

## 2022-03-02 VITALS — BP 126/86 | HR 65 | Temp 98.7°F | Wt 223.4 lb

## 2022-03-02 DIAGNOSIS — K3184 Gastroparesis: Secondary | ICD-10-CM

## 2022-03-02 NOTE — Progress Notes (Signed)
Wyline Mood MD, MRCP(U.K) [address]  [address]  [place], [place] [address]  Main: [phone]  Fax: [phone]   Primary Care Physician: Jacky Kindle, FNP  Primary Gastroenterologist:  Dr. Wyline Mood   Chief Complaint  Patient presents with   dyspepsia    HPI: Jo Soto is a 41 y.o. female Summary of history :   Initially referred and seen on 04/23/2020 for IBS.  She also has history of GERD, gastroparesis.  She has been issues with regurgitation, abdominal cramping right after she eats in the central part of her abdomen associated with a bowel movement.  Issues of heartburn in the past.  Had lost some weight.  On Prilosec 40 mg a day which did not help.  05/07/2020: HIDA scan: Ejection fraction 19%: Referred to Dr. Aleen Campi for cholecystectomy 05/06/2020: EGD: Food seen in the stomach moderate in quantity.  05/29/2020: Gastric emptying study: Normal 07/02/2020: Cholecystectomy: Pathology specimen demonstrated chronic cholecystitis  08/20/2021: EGD: normal : bx showed gastritis    Interval history 11/17/2021-03/02/2022   12/10/2021: CT abdomen pelvis with contrast showed no acute abdominal abnormalities.  Since her last visit she is doing better.  Has learned how to change her diet.  Gained weight.  No other complaint.     Current Outpatient Medications  Medication Sig Dispense Refill   buPROPion (WELLBUTRIN SR) 150 MG 12 hr tablet Take 150 mg by mouth daily.     levonorgestrel (MIRENA) 20 MCG/DAY IUD 1 each by Intrauterine route once.     losartan-hydrochlorothiazide (HYZAAR) 100-25 MG tablet Take 1 tablet by mouth daily. 90 tablet 3   Multiple Vitamin (MULTIVITAMIN WITH MINERALS) TABS tablet Take 1 tablet by mouth in the morning.     Vitamin D, Ergocalciferol, (DRISDOL) 1.25 MG (50000 UNIT) CAPS capsule Take 1 capsule (50,000 Units total) by mouth every 7 (seven) days. Take with fat containing meal; repeat labs in 6 months. 26 capsule 0   sucralfate (CARAFATE) 1  g tablet TAKE 1 TABLET (1 G TOTAL) BY MOUTH 4 TIMES A DAY WITH MEALS AND AT BEDTIME (Patient not taking: Reported on 03/02/2022) 360 tablet 4   No current facility-administered medications for this visit.    Allergies as of 03/02/2022   (No Known Allergies)    ROS:  General: Negative for anorexia, weight loss, fever, chills, fatigue, weakness. ENT: Negative for hoarseness, difficulty swallowing , nasal congestion. CV: Negative for chest pain, angina, palpitations, dyspnea on exertion, peripheral edema.  Respiratory: Negative for dyspnea at rest, dyspnea on exertion, cough, sputum, wheezing.  GI: See history of present illness. GU:  Negative for dysuria, hematuria, urinary incontinence, urinary frequency, nocturnal urination.  Endo: Negative for unusual weight change.    Physical Examination:   BP 126/86   Pulse 65   Temp 98.7 F (37.1 C) (Oral)   Wt 223 lb 6.4 oz (101.3 kg)   BMI 39.57 kg/m   General: Well-nourished, well-developed in no acute distress.  Eyes: No icterus. Conjunctivae pink. Neuro: Alert and oriented x 3.  Grossly intact. Skin: Warm and dry, no jaundice.   Psych: Alert and cooperative, normal mood and affect.   Imaging Studies: No results found.  Assessment and Plan:   Jo Soto is a 41 y.o. y/o female here to follow-up for dyspepsia.  Not responded to PPI, Bentyl.  Possible underlying gastroparesis.  CT abdomen showed no abnormalities already on bupropion.  Sucralfate tablets have helped and she is following a gastroparesis diet.  No other  acute issues     Dr Wyline Mood  MD,MRCP [facility]) Follow up in as needed

## 2022-07-30 ENCOUNTER — Encounter: Payer: Self-pay | Admitting: Family Medicine

## 2022-07-30 ENCOUNTER — Telehealth (INDEPENDENT_AMBULATORY_CARE_PROVIDER_SITE_OTHER): Payer: BC Managed Care – PPO | Admitting: Family Medicine

## 2022-07-30 VITALS — Ht 63.0 in

## 2022-07-30 DIAGNOSIS — G8929 Other chronic pain: Secondary | ICD-10-CM | POA: Diagnosis not present

## 2022-07-30 DIAGNOSIS — M545 Low back pain, unspecified: Secondary | ICD-10-CM | POA: Diagnosis not present

## 2022-07-30 MED ORDER — TRAMADOL HCL 50 MG PO TABS: 50.0000 mg | ORAL_TABLET | Freq: Three times a day (TID) | ORAL | 0 refills | Status: DC | PRN

## 2022-07-30 NOTE — Assessment & Plan Note (Signed)
Acute on chronic No known injury or mechanism of pain Worse in 1 month hx Not improved with APAP or rest or use of previously prescribed xanaflex 5 day course of tramadol provided with referral to ortho for imaging and next steps Discussed [facility] or similar if needed Patient appears to be in no acute distress based on limited video assessment Denies concern for trauma to LE including edema, temperature, color change or change in sensation Denies concern for loss of bowel or bladder

## 2022-07-30 NOTE — Progress Notes (Signed)
MyChart Video Visit    Virtual Visit via Video Note   This format is felt to be most appropriate for this patient at this time. Physical exam was limited by quality of the video and audio technology used for the visit.   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  [address] [place], [place] [address]   I discussed the limitations of evaluation and management by telemedicine and the availability of in person appointments. The patient expressed understanding and agreed to proceed.  Patient: Jo Soto   DOB: [birth year]   42 y.o. Female  MRN: [id number] Visit Date: 07/30/2022  Today's healthcare provider: Jacky Kindle, FNP  Introduced to nurse practitioner role and practice setting.  All questions answered.  Discussed provider/patient relationship and expectations.  Chief Complaint  Patient presents with   Back Pain   Subjective    Back Pain This is a recurrent problem. The current episode started more than 1 month ago. The problem occurs constantly. The problem has been gradually worsening since onset. The pain is present in the lumbar spine. The quality of the pain is described as aching. The pain does not radiate. The pain is at a severity of 10/10. The pain is moderate. The symptoms are aggravated by bending, standing and twisting. Stiffness is present All day. She has tried analgesics and muscle relaxant for the symptoms. The treatment provided mild relief.   Medications: Outpatient Medications Prior to Visit  Medication Sig   buPROPion (WELLBUTRIN SR) 150 MG 12 hr tablet Take 150 mg by mouth daily.   levonorgestrel (MIRENA) 20 MCG/DAY IUD 1 each by Intrauterine route once.   losartan-hydrochlorothiazide (HYZAAR) 100-25 MG tablet Take 1 tablet by mouth daily.   Multiple Vitamin (MULTIVITAMIN WITH MINERALS) TABS tablet Take 1 tablet by mouth in the morning.   Vitamin D, Ergocalciferol, (DRISDOL) 1.25 MG (50000 UNIT) CAPS  capsule Take 1 capsule (50,000 Units total) by mouth every 7 (seven) days. Take with fat containing meal; repeat labs in 6 months.   [DISCONTINUED] sucralfate (CARAFATE) 1 g tablet TAKE 1 TABLET (1 G TOTAL) BY MOUTH 4 TIMES A DAY WITH MEALS AND AT BEDTIME   No facility-administered medications prior to visit.    Review of Systems  Musculoskeletal:  Positive for back pain.    Objective    Ht 5\' 3"  (1.6 m)   BMI 39.57 kg/m   Physical Exam Constitutional:      Appearance: She is obese.  Pulmonary:     Effort: Pulmonary effort is normal.  Genitourinary:    Comments: Denies loss of bowel or bladder in setting of acute on chronic back pain  Musculoskeletal:     Comments: Denies gross edema, change in skin color or skin temperature.  Denies sciatica/nerve pain involvement. Denies known mechanism of injury  Neurological:     Mental Status: She is alert and oriented to person, place, and time. Mental status is at baseline.     Assessment & Plan     Problem List Items Addressed This Visit       Other   Chronic bilateral low back pain without sciatica - Primary    Acute on chronic No known injury or mechanism of pain Worse in 1 month hx Not improved with APAP or rest or use of previously prescribed xanaflex 5 day course of tramadol provided with referral to ortho for imaging and next steps Discussed [facility] or similar if needed Patient  appears to be in no acute distress based on limited video assessment Denies concern for trauma to LE including edema, temperature, color change or change in sensation Denies concern for loss of bowel or bladder      Relevant Medications   traMADol (ULTRAM) 50 MG tablet   Other Relevant Orders   Ambulatory referral to Orthopedics    Please let us know if you do not hear from Ortho in 1 week.   I discussed the assessment and treatment plan with the patient. The patient was provided an opportunity to ask questions and  all were answered. The patient agreed with the plan and demonstrated an understanding of the instructions.   The patient was advised to call back or seek an in-person evaluation if the symptoms worsen or if the condition fails to improve as anticipated.  I provided 10 minutes of face-to-face time during this encounter discussing acute on chronic back pain and need for pain relief and referral to ortho for imaging/POC.  Leilani Merl, FNP, have reviewed all documentation for this visit. The documentation on 07/30/22 for the exam, diagnosis, procedures, and orders are all accurate and complete.   Jacky Kindle, FNP [facility] [phone] (phone) [phone] (fax)  [facility]

## 2022-08-02 IMAGING — NM NM HEPATO W/GB/PHARM/[PERSON_NAME]
2 series · 12 of 12 positions shown · non-contrast
Comparison: None.

CLINICAL DATA: Epigastric pain

EXAM:
NUCLEAR MEDICINE HEPATOBILIARY IMAGING WITH GALLBLADDER EF
TECHNIQUE: Sequential images of the abdomen were obtained [DATE] minutes
following intravenous administration of radiopharmaceutical. After
oral ingestion of Ensure, gallbladder ejection fraction was
determined. At 60 min, normal ejection fraction is greater than 33%.
RADIOPHARMACEUTICALS:  5.18 mCi 6c-ZZm  Choletec IV

[Series 1000: gallbladder ef · 4.80mm/px · 6 of 120 frames shown]
[frame 11/120]
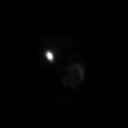
[frame 31/120]
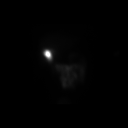
[frame 51/120]
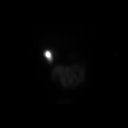
[frame 71/120]
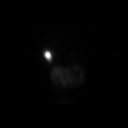
[frame 91/120]
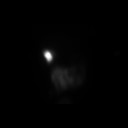
[frame 111/120]
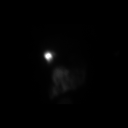

[Series 1000: hepatobiliary scan · 9.59mm/px · 6 of 60 frames shown]
[frame 6/60]
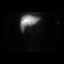
[frame 16/60]
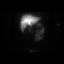
[frame 26/60]
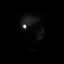
[frame 36/60]
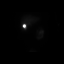
[frame 46/60]
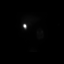
[frame 56/60]
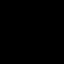

[12 of 12 positions shown; findings below may reference images not displayed]

FINDINGS: Prompt uptake and biliary excretion of activity by the liver is
seen. Gallbladder activity is visualized, consistent with patency of
cystic duct. Biliary activity passes into small bowel, consistent
with patent common bile duct.

Calculated gallbladder ejection fraction is 19%. (Normal gallbladder
ejection fraction with Ensure is greater than 33%.)
IMPRESSION: 1. Negative for acute gallbladder disease.
2. Decreased gallbladder ejection fraction of 19%. This can be seen
with functional gallbladder disease in the appropriate clinical
setting.

## 2022-08-10 DIAGNOSIS — M545 Low back pain, unspecified: Secondary | ICD-10-CM | POA: Diagnosis not present

## 2022-08-11 ENCOUNTER — Telehealth: Payer: Self-pay

## 2022-08-11 ENCOUNTER — Other Ambulatory Visit: Payer: Self-pay | Admitting: Family Medicine

## 2022-08-11 NOTE — Telephone Encounter (Signed)
Contacted patient to schedule office visit so that FMLA paperwork could be completed properly. Appointment scheduled for tomorrow at 4:00 pm

## 2022-08-12 ENCOUNTER — Ambulatory Visit: Payer: BC Managed Care – PPO | Admitting: Family Medicine

## 2022-08-12 ENCOUNTER — Encounter: Payer: Self-pay | Admitting: Family Medicine

## 2022-08-12 VITALS — BP 107/67 | HR 78 | Ht 63.0 in | Wt 234.6 lb

## 2022-08-12 DIAGNOSIS — K3184 Gastroparesis: Secondary | ICD-10-CM | POA: Diagnosis not present

## 2022-08-12 DIAGNOSIS — E782 Mixed hyperlipidemia: Secondary | ICD-10-CM | POA: Diagnosis not present

## 2022-08-12 DIAGNOSIS — F339 Major depressive disorder, recurrent, unspecified: Secondary | ICD-10-CM

## 2022-08-12 DIAGNOSIS — I10 Essential (primary) hypertension: Secondary | ICD-10-CM

## 2022-08-12 DIAGNOSIS — R7303 Prediabetes: Secondary | ICD-10-CM | POA: Diagnosis not present

## 2022-08-12 DIAGNOSIS — Z23 Encounter for immunization: Secondary | ICD-10-CM

## 2022-08-12 DIAGNOSIS — E559 Vitamin D deficiency, unspecified: Secondary | ICD-10-CM

## 2022-08-12 DIAGNOSIS — Z87891 Personal history of nicotine dependence: Secondary | ICD-10-CM

## 2022-08-12 MED ORDER — SUCRALFATE 1 G PO TABS: 1.0000 g | ORAL_TABLET | Freq: Three times a day (TID) | ORAL | 11 refills | Status: DC

## 2022-08-12 MED ORDER — DICYCLOMINE HCL 10 MG PO CAPS: 10.0000 mg | ORAL_CAPSULE | Freq: Three times a day (TID) | ORAL | 11 refills | Status: DC

## 2022-08-12 NOTE — Assessment & Plan Note (Signed)
Chronic, was on supplementation Repeat labs; has just finished supplement

## 2022-08-12 NOTE — Assessment & Plan Note (Signed)
Chronic, previously stable with diet/exercise Repeat A1c Discussed importance of healthy weight management Discussed diet and exercise

## 2022-08-12 NOTE — Assessment & Plan Note (Signed)
Chronic, stable Has stopped wellbutrin as she did not feel it helped with her desire to smoke Remains pre contemplative at this time; likely worsening abdominal pains and has been made aware multiple times

## 2022-08-12 NOTE — Assessment & Plan Note (Signed)
Chronic, mild per pt report; self discontinuation of wellbutrin    08/12/2022    4:03 PM 01/19/2022    8:42 AM 01/07/2022    4:15 PM 09/01/2021    3:07 PM 05/26/2021    1:11 PM  Depression screen PHQ 2/9  Decreased Interest 0 0 1 2 2   Down, Depressed, Hopeless 0 1 1 1 2   PHQ - 2 Score 0 1 2 3 4   Altered sleeping 3 1 1 3 3   Tired, decreased energy 2 1 1 3 3   Change in appetite 3 1 2 3 3   Feeling bad or failure about yourself  0 0 0 0 0  Trouble concentrating 0 0 1 0 0  Moving slowly or fidgety/restless 0 0 0 0 0  Suicidal thoughts 0 0 0 0 0  PHQ-9 Score 8 4 7 12 13   Difficult doing work/chores Not difficult at all Not difficult at all Not difficult at all Somewhat difficult Very difficult

## 2022-08-12 NOTE — Progress Notes (Signed)
I,Sha'taria Tyson,acting as a Neurosurgeon for Jacky Kindle, FNP.,have documented all relevant documentation on the behalf of Jacky Kindle, FNP,as directed by  Jacky Kindle, FNP while in the presence of Jacky Kindle, FNP.   Established patient visit   Patient: Jo Soto   DOB: [birth year]   42 y.o. Female  MRN: [id number] Visit Date: 08/12/2022  Today's healthcare provider: Jacky Kindle, FNP  Introduced to nurse practitioner role and practice setting.  All questions answered.  Discussed provider/patient relationship and expectations.  Subjective    HPI  -Patient is needing FMLA paperwork to be completed due to still having stomach concerns to the point she is not feeling well enough to go to work and would need 1-2 days off of work a week.   Billing time reflects exam as well as FMLA completion  Hypertension, follow-up  BP Readings from Last 3 Encounters:  08/12/22 107/67  03/02/22 126/86  01/19/22 (!) 125/90   Wt Readings from Last 3 Encounters:  08/12/22 234 lb 9.6 oz (106.4 kg)  03/02/22 223 lb 6.4 oz (101.3 kg)  01/19/22 223 lb (101.2 kg)     She was last seen for hypertension 7 months ago.  BP at that visit was 125/90. Management since that visit includes continue to work on lifestyle management including heart healthy diet and purposeful exercise to assist hyzaar 100-25 .  She reports excellent compliance with treatment. She is not having side effects.  She is following a Regular diet. She is not exercising. She does smoke. Outside blood pressures are not being checked. Symptoms: No chest pain No chest pressure  No palpitations No syncope  No dyspnea No orthopnea  No paroxysmal nocturnal dyspnea Yes lower extremity edema   Pertinent labs Lab Results  Component Value Date   CHOL 188 01/19/2022   HDL 61 01/19/2022   LDLCALC 105 (H) 01/19/2022   TRIG 126 01/19/2022   CHOLHDL 3.1 01/19/2022   Lab Results  Component Value Date   NA 144 01/19/2022   K  3.9 01/19/2022   CREATININE 1.29 (H) 01/19/2022   EGFR 54 (L) 01/19/2022   GLUCOSE 84 01/19/2022   TSH 1.400 01/19/2022     The 10-year ASCVD risk score (Arnett DK, et al., 2019) is: 0.9% ---------------------------------------------------------------------------------------------------  Depression, Follow-up  She  was last seen for this 7 months ago. Changes made at last visit include continue wellbutrin.   She reports poor tolerance of treatment. Current symptoms include: insomnia She feels she is Unchanged since last visit.     08/12/2022    4:03 PM 01/19/2022    8:42 AM 01/07/2022    4:15 PM  Depression screen PHQ 2/9  Decreased Interest 0 0 1  Down, Depressed, Hopeless 0 1 1  PHQ - 2 Score 0 1 2  Altered sleeping 3 1 1   Tired, decreased energy 2 1 1   Change in appetite 3 1 2   Feeling bad or failure about yourself  0 0 0  Trouble concentrating 0 0 1  Moving slowly or fidgety/restless 0 0 0  Suicidal thoughts 0 0 0  PHQ-9 Score 8 4 7   Difficult doing work/chores Not difficult at all Not difficult at all Not difficult at all   -----------------------------------------------------------------------------------------   Medications: Outpatient Medications Prior to Visit  Medication Sig   levonorgestrel (MIRENA) 20 MCG/DAY IUD 1 each by Intrauterine route once.   losartan-hydrochlorothiazide (HYZAAR) 100-25 MG tablet Take 1 tablet by mouth daily.  Multiple Vitamin (MULTIVITAMIN WITH MINERALS) TABS tablet Take 1 tablet by mouth in the morning.   traMADol (ULTRAM) 50 MG tablet Take 1 tablet (50 mg total) by mouth 3 (three) times daily as needed for severe pain.   Vitamin D, Ergocalciferol, (DRISDOL) 1.25 MG (50000 UNIT) CAPS capsule Take 1 capsule (50,000 Units total) by mouth every 7 (seven) days. Take with fat containing meal; repeat labs in 6 months.   [DISCONTINUED] buPROPion (WELLBUTRIN SR) 150 MG 12 hr tablet Take 150 mg by mouth daily.   No facility-administered  medications prior to visit.    Review of Systems  Last CBC Lab Results  Component Value Date   WBC 4.9 01/19/2022   HGB 12.3 01/19/2022   HCT 38.6 01/19/2022   MCV 89 01/19/2022   MCH 28.3 01/19/2022   RDW 14.2 01/19/2022   PLT 300 01/19/2022   Last metabolic panel Lab Results  Component Value Date   GLUCOSE 84 01/19/2022   NA 144 01/19/2022   K 3.9 01/19/2022   CL 113 (H) 01/19/2022   CO2 18 (L) 01/19/2022   BUN 13 01/19/2022   CREATININE 1.29 (H) 01/19/2022   EGFR 54 (L) 01/19/2022   CALCIUM 8.8 01/19/2022   PROT 6.6 01/19/2022   ALBUMIN 4.2 01/19/2022   LABGLOB 2.4 01/19/2022   AGRATIO 1.8 01/19/2022   BILITOT <0.2 01/19/2022   ALKPHOS 58 01/19/2022   AST 17 01/19/2022   ALT 18 01/19/2022   Last lipids Lab Results  Component Value Date   CHOL 188 01/19/2022   HDL 61 01/19/2022   LDLCALC 105 (H) 01/19/2022   TRIG 126 01/19/2022   CHOLHDL 3.1 01/19/2022   Last hemoglobin A1c Lab Results  Component Value Date   HGBA1C 5.7 (H) 01/19/2022   Last thyroid functions Lab Results  Component Value Date   TSH 1.400 01/19/2022   Last vitamin D Lab Results  Component Value Date   VD25OH 19.4 (L) 01/19/2022   Last vitamin B12 and Folate No results found for: "VITAMINB12", "FOLATE"     Objective    BP 107/67 (BP Location: Right Arm, Patient Position: Sitting, Cuff Size: Normal)   Pulse 78   Ht 5\' 3"  (1.6 m)   Wt 234 lb 9.6 oz (106.4 kg)   SpO2 100%   BMI 41.56 kg/m  BP Readings from Last 3 Encounters:  08/12/22 107/67  03/02/22 126/86  01/19/22 (!) 125/90   Wt Readings from Last 3 Encounters:  08/12/22 234 lb 9.6 oz (106.4 kg)  03/02/22 223 lb 6.4 oz (101.3 kg)  01/19/22 223 lb (101.2 kg)   SpO2 Readings from Last 3 Encounters:  08/12/22 100%  01/19/22 98%  01/07/22 99%   Physical Exam Vitals and nursing note reviewed.  Constitutional:      General: She is not in acute distress.    Appearance: Normal appearance. She is obese. She is not  ill-appearing, toxic-appearing or diaphoretic.  HENT:     Head: Normocephalic and atraumatic.  Cardiovascular:     Rate and Rhythm: Normal rate and regular rhythm.     Pulses: Normal pulses.     Heart sounds: Normal heart sounds. No murmur heard.    No friction rub. No gallop.  Pulmonary:     Effort: Pulmonary effort is normal. No respiratory distress.     Breath sounds: Normal breath sounds. No stridor. No wheezing, rhonchi or rales.  Chest:     Chest wall: No tenderness.  Abdominal:     General: Bowel  sounds are normal. There is distension.     Palpations: Abdomen is soft.     Tenderness: There is abdominal tenderness. There is guarding.  Musculoskeletal:        General: No swelling, tenderness, deformity or signs of injury. Normal range of motion.     Right lower leg: No edema.     Left lower leg: No edema.  Skin:    General: Skin is warm and dry.     Capillary Refill: Capillary refill takes less than 2 seconds.     Coloration: Skin is not jaundiced or pale.     Findings: No bruising, erythema, lesion or rash.  Neurological:     General: No focal deficit present.     Mental Status: She is alert and oriented to person, place, and time. Mental status is at baseline.     Cranial Nerves: No cranial nerve deficit.     Sensory: No sensory deficit.     Motor: No weakness.     Coordination: Coordination normal.  Psychiatric:        Mood and Affect: Mood normal.        Behavior: Behavior normal.        Thought Content: Thought content normal.        Judgment: Judgment normal.     No results found for any visits on 08/12/22.  Assessment & Plan     Problem List Items Addressed This Visit       Cardiovascular and Mediastinum   Primary hypertension    Chronic, stable At goal Endorses slight edema to L ankle Continue hyzaar 100-25  Repeat CBC/CMP      Relevant Orders   Comprehensive Metabolic Panel (CMET)   CBC     Digestive   Gastroparesis - Primary    Chronic,  variable Request for FMLA to assist Request for refills of both bentyl and carafate Previously seen by GI Has not stopped tobacco at this time Weight gain of 12 lbs noted Body mass index is 41.56 kg/m. Continue to recommend balanced, lower carb meals. Smaller meal size, adding snacks. Choosing water as drink of choice and increasing purposeful exercise.         Other   Avitaminosis D    Chronic, was on supplementation Repeat labs; has just finished supplement       Relevant Orders   Vitamin D (25 hydroxy)   Depression, recurrent (HCC)    Chronic, mild per pt report; self discontinuation of wellbutrin    08/12/2022    4:03 PM 01/19/2022    8:42 AM 01/07/2022    4:15 PM 09/01/2021    3:07 PM 05/26/2021    1:11 PM  Depression screen PHQ 2/9  Decreased Interest 0 0 1 2 2   Down, Depressed, Hopeless 0 1 1 1 2   PHQ - 2 Score 0 1 2 3 4   Altered sleeping 3 1 1 3 3   Tired, decreased energy 2 1 1 3 3   Change in appetite 3 1 2 3 3   Feeling bad or failure about yourself  0 0 0 0 0  Trouble concentrating 0 0 1 0 0  Moving slowly or fidgety/restless 0 0 0 0 0  Suicidal thoughts 0 0 0 0 0  PHQ-9 Score 8 4 7 12 13   Difficult doing work/chores Not difficult at all Not difficult at all Not difficult at all Somewhat difficult Very difficult        Relevant Orders   Comprehensive Metabolic Panel (CMET)  Lipid panel   CBC   Hemoglobin A1c   Moderate mixed hyperlipidemia not requiring statin therapy    Chronic, elevated LDL The 10-year ASCVD risk score (Arnett DK, et al., 2019) is: 0.9% Repeat LP recommend diet low in saturated fat and regular exercise - 30 min at least 5 times per week       Relevant Orders   Lipid panel   Morbid obesity (HCC)    Chronic, with 12# weight gain Body mass index is 41.56 kg/m.       Relevant Orders   Comprehensive Metabolic Panel (CMET)   Lipid panel   CBC   Hemoglobin A1c   Personal history of nicotine dependence    Chronic, stable Has  stopped wellbutrin as she did not feel it helped with her desire to smoke Remains pre contemplative at this time; likely worsening abdominal pains and has been made aware multiple times       Prediabetes    Chronic, previously stable with diet/exercise Repeat A1c Discussed importance of healthy weight management Discussed diet and exercise       Relevant Orders   Hemoglobin A1c   Other Visit Diagnoses     Encounter for immunization       Relevant Orders   Tdap vaccine greater than or equal to 7yo IM (Completed)     Return in about 5 months (around 01/12/2023) for annual examination.     Leilani Merl, FNP, have reviewed all documentation for this visit. The documentation on 08/12/22 for the exam, diagnosis, procedures, and orders are all accurate and complete.  Jacky Kindle, FNP  [facility] [phone] (phone) [phone] (fax)  [facility]

## 2022-08-12 NOTE — Assessment & Plan Note (Signed)
Chronic, with 12# weight gain Body mass index is 41.56 kg/m.

## 2022-08-12 NOTE — Assessment & Plan Note (Signed)
Chronic, stable At goal Endorses slight edema to L ankle Continue hyzaar 100-25  Repeat CBC/CMP

## 2022-08-12 NOTE — Assessment & Plan Note (Signed)
Chronic, variable Request for FMLA to assist Request for refills of both bentyl and carafate Previously seen by GI Has not stopped tobacco at this time Weight gain of 12 lbs noted Body mass index is 41.56 kg/m. Continue to recommend balanced, lower carb meals. Smaller meal size, adding snacks. Choosing water as drink of choice and increasing purposeful exercise.

## 2022-08-12 NOTE — Assessment & Plan Note (Signed)
Chronic, elevated LDL The 10-year ASCVD risk score (Arnett DK, et al., 2019) is: 0.9% Repeat LP recommend diet low in saturated fat and regular exercise - 30 min at least 5 times per week

## 2022-08-13 ENCOUNTER — Other Ambulatory Visit: Payer: Self-pay | Admitting: Family Medicine

## 2022-08-13 LAB — CBC
Hematocrit: 37.6 % (ref 34.0–46.6)
Hemoglobin: 12.4 g/dL (ref 11.1–15.9)
MCH: 28.4 pg (ref 26.6–33.0)
MCHC: 33 g/dL (ref 31.5–35.7)
MCV: 86 fL (ref 79–97)
Platelets: 304 10*3/uL (ref 150–450)
RBC: 4.36 x10E6/uL (ref 3.77–5.28)
RDW: 14 % (ref 11.7–15.4)
WBC: 7.5 10*3/uL (ref 3.4–10.8)

## 2022-08-13 LAB — COMPREHENSIVE METABOLIC PANEL
ALT: 23 IU/L (ref 0–32)
AST: 19 IU/L (ref 0–40)
Albumin/Globulin Ratio: 1.9 (ref 1.2–2.2)
Albumin: 4.5 g/dL (ref 3.9–4.9)
Alkaline Phosphatase: 67 IU/L (ref 44–121)
BUN/Creatinine Ratio: 16 (ref 9–23)
BUN: 16 mg/dL (ref 6–24)
Bilirubin Total: 0.2 mg/dL (ref 0.0–1.2)
CO2: 23 mmol/L (ref 20–29)
Calcium: 9.2 mg/dL (ref 8.7–10.2)
Chloride: 98 mmol/L (ref 96–106)
Creatinine, Ser: 1.02 mg/dL — ABNORMAL HIGH (ref 0.57–1.00)
Globulin, Total: 2.4 g/dL (ref 1.5–4.5)
Glucose: 130 mg/dL — ABNORMAL HIGH (ref 70–99)
Potassium: 3.6 mmol/L (ref 3.5–5.2)
Sodium: 137 mmol/L (ref 134–144)
Total Protein: 6.9 g/dL (ref 6.0–8.5)
eGFR: 71 mL/min/{1.73_m2} (ref >59)

## 2022-08-13 LAB — LIPID PANEL
Chol/HDL Ratio: 3.5 ratio (ref 0.0–4.4)
Cholesterol, Total: 197 mg/dL (ref 100–199)
HDL: 57 mg/dL (ref >39)
LDL Chol Calc (NIH): 106 mg/dL — ABNORMAL HIGH (ref 0–99)
Triglycerides: 199 mg/dL — ABNORMAL HIGH (ref 0–149)
VLDL Cholesterol Cal: 34 mg/dL (ref 5–40)

## 2022-08-13 LAB — VITAMIN D 25 HYDROXY (VIT D DEFICIENCY, FRACTURES): Vit D, 25-Hydroxy: 33.2 ng/mL (ref 30.0–100.0)

## 2022-08-13 LAB — HEMOGLOBIN A1C
Est. average glucose Bld gHb Est-mCnc: 126 mg/dL
Hgb A1c MFr Bld: 6 % — ABNORMAL HIGH (ref 4.8–5.6)

## 2022-08-13 MED ORDER — VITAMIN D (ERGOCALCIFEROL) 1.25 MG (50000 UNIT) PO CAPS: 50000.0000 [IU] | ORAL_CAPSULE | ORAL | 0 refills | Status: DC

## 2022-08-13 NOTE — Progress Notes (Signed)
Blood chemistry noted -elevated glucose -improved creatinine  Cholesterol panel noted -stable bad/LDL cholesterol elevation -The 10-year ASCVD risk score (Arnett DK, et al., 2019) is: 1.1% -I continue to recommend diet low in saturated fat and regular exercise - 30 min at least 5 times per week  A1c, blood sugar average noted -slightly worse pre-diabetes, A1c now at 6%. -Continue to recommend balanced, lower carb meals. Smaller meal size, adding snacks. Choosing water as drink of choice and increasing purposeful exercise.  Vit D noted -improved but borderline low readings; will re-prescribe supplement to assist

## 2022-11-09 ENCOUNTER — Other Ambulatory Visit: Payer: Self-pay | Admitting: Family Medicine

## 2022-11-18 DIAGNOSIS — M542 Cervicalgia: Secondary | ICD-10-CM | POA: Diagnosis not present

## 2022-11-18 DIAGNOSIS — M545 Low back pain, unspecified: Secondary | ICD-10-CM | POA: Diagnosis not present

## 2022-11-18 DIAGNOSIS — S20219A Contusion of unspecified front wall of thorax, initial encounter: Secondary | ICD-10-CM | POA: Diagnosis not present

## 2023-01-11 ENCOUNTER — Telehealth: Payer: Self-pay | Admitting: Family Medicine

## 2023-01-11 NOTE — Telephone Encounter (Signed)
FMLA paperwork was received on 01/11/2023 & placed in provider's mailbox. Please allow 7-10 business days to be completed. Thank you

## 2023-01-13 ENCOUNTER — Other Ambulatory Visit: Payer: Self-pay | Admitting: Family Medicine

## 2023-01-13 DIAGNOSIS — I10 Essential (primary) hypertension: Secondary | ICD-10-CM

## 2023-01-18 ENCOUNTER — Encounter: Payer: Self-pay | Admitting: Family Medicine

## 2023-01-18 ENCOUNTER — Ambulatory Visit: Payer: BC Managed Care – PPO | Admitting: Family Medicine

## 2023-01-18 VITALS — BP 120/83 | HR 70 | Resp 16 | Ht 63.0 in | Wt 230.4 lb

## 2023-01-18 DIAGNOSIS — R7303 Prediabetes: Secondary | ICD-10-CM | POA: Diagnosis not present

## 2023-01-18 DIAGNOSIS — E782 Mixed hyperlipidemia: Secondary | ICD-10-CM

## 2023-01-18 DIAGNOSIS — F339 Major depressive disorder, recurrent, unspecified: Secondary | ICD-10-CM

## 2023-01-18 DIAGNOSIS — Z1151 Encounter for screening for human papillomavirus (HPV): Secondary | ICD-10-CM

## 2023-01-18 DIAGNOSIS — E559 Vitamin D deficiency, unspecified: Secondary | ICD-10-CM | POA: Diagnosis not present

## 2023-01-18 DIAGNOSIS — K3184 Gastroparesis: Secondary | ICD-10-CM | POA: Diagnosis not present

## 2023-01-18 DIAGNOSIS — F17200 Nicotine dependence, unspecified, uncomplicated: Secondary | ICD-10-CM

## 2023-01-18 DIAGNOSIS — I10 Essential (primary) hypertension: Secondary | ICD-10-CM | POA: Diagnosis not present

## 2023-01-18 NOTE — Progress Notes (Signed)
Established patient visit   Patient: Jo Soto   DOB: 1980-08-05   42 y.o. Female  MRN: [id number] Visit Date: 01/18/2023  Today's healthcare provider: Jacky Kindle, FNP  Introduced to nurse practitioner role and practice setting.  All questions answered.  Discussed provider/patient relationship and expectations.  Subjective    HPI HPI     FMLA    Additional comments: Here to discuss FMLA. It needs to be renewed.       Last edited by Jama Flavors, CMA on 01/18/2023  1:33 PM.      The patient, with a history of gastrointestinal issues and prediabetes, presents for a routine follow-up and paperwork renewal for FMLA. The patient reports persistent stomach issues, including spasms, despite being on two prescribed medications. The frequency of these episodes is about two to three days a week, often triggered by meals. The patient also reports feeling tired and restless, which may be related to her sleep disorder. The patient uses a sleep appliance intermittently due to discomfort. The patient also has prediabetes, which has shown a slight increase from 5.7 to 6.0 in the past. The patient's blood pressure, pulse, and weight have remained stable.  Medications: Outpatient Medications Prior to Visit  Medication Sig   dicyclomine (BENTYL) 10 MG capsule Take 1 capsule (10 mg total) by mouth 4 (four) times daily -  before meals and at bedtime.   levonorgestrel (MIRENA) 20 MCG/DAY IUD 1 each by Intrauterine route once.   losartan-hydrochlorothiazide (HYZAAR) 100-25 MG tablet TAKE 1 TABLET BY MOUTH EVERY DAY   Multiple Vitamin (MULTIVITAMIN WITH MINERALS) TABS tablet Take 1 tablet by mouth in the morning.   sucralfate (CARAFATE) 1 g tablet Take 1 tablet (1 g total) by mouth 4 (four) times daily -  with meals and at bedtime.   traMADol (ULTRAM) 50 MG tablet Take 1 tablet (50 mg total) by mouth 3 (three) times daily as needed for severe pain.   Vitamin D, Ergocalciferol, (DRISDOL)  1.25 MG (50000 UNIT) CAPS capsule TAKE 1 CAPSULE BY MOUTH EVERY 7 DAYS. TAKE WITH FAT CONTAINING MEAL REPEAT LABS IN 6 MONTHS.   No facility-administered medications prior to visit.    Review of Systems  Last CBC Lab Results  Component Value Date   WBC 7.5 08/12/2022   HGB 12.4 08/12/2022   HCT 37.6 08/12/2022   MCV 86 08/12/2022   MCH 28.4 08/12/2022   RDW 14.0 08/12/2022   PLT 304 08/12/2022   Last metabolic panel Lab Results  Component Value Date   GLUCOSE 130 (H) 08/12/2022   NA 137 08/12/2022   K 3.6 08/12/2022   CL 98 08/12/2022   CO2 23 08/12/2022   BUN 16 08/12/2022   CREATININE 1.02 (H) 08/12/2022   EGFR 71 08/12/2022   CALCIUM 9.2 08/12/2022   PROT 6.9 08/12/2022   ALBUMIN 4.5 08/12/2022   LABGLOB 2.4 08/12/2022   AGRATIO 1.9 08/12/2022   BILITOT 0.2 08/12/2022   ALKPHOS 67 08/12/2022   AST 19 08/12/2022   ALT 23 08/12/2022   Last lipids Lab Results  Component Value Date   CHOL 197 08/12/2022   HDL 57 08/12/2022   LDLCALC 106 (H) 08/12/2022   TRIG 199 (H) 08/12/2022   CHOLHDL 3.5 08/12/2022   Last hemoglobin A1c Lab Results  Component Value Date   HGBA1C 6.0 (H) 08/12/2022   Last thyroid functions Lab Results  Component Value Date   TSH 1.400 01/19/2022   Last vitamin D Lab Results  Component Value Date   VD25OH 33.2 08/12/2022      Objective    BP 120/83   Pulse 70   Resp 16   Ht 5\' 3"  (1.6 m)   Wt 230 lb 6.4 oz (104.5 kg)   BMI 40.81 kg/m   Physical Exam Vitals and nursing note reviewed.  Constitutional:      General: She is not in acute distress.    Appearance: Normal appearance. She is obese. She is not ill-appearing, toxic-appearing or diaphoretic.  HENT:     Head: Normocephalic and atraumatic.  Cardiovascular:     Rate and Rhythm: Normal rate and regular rhythm.     Pulses: Normal pulses.     Heart sounds: Normal heart sounds. No murmur heard.    No friction rub. No gallop.  Pulmonary:     Effort: Pulmonary effort  is normal. No respiratory distress.     Breath sounds: Normal breath sounds. No stridor. No wheezing, rhonchi or rales.  Chest:     Chest wall: No tenderness.  Abdominal:     General: Bowel sounds are normal.     Palpations: Abdomen is soft.  Musculoskeletal:        General: No swelling, tenderness, deformity or signs of injury. Normal range of motion.     Right lower leg: No edema.     Left lower leg: No edema.  Skin:    General: Skin is warm and dry.     Capillary Refill: Capillary refill takes less than 2 seconds.     Coloration: Skin is not jaundiced or pale.     Findings: No bruising, erythema, lesion or rash.  Neurological:     General: No focal deficit present.     Mental Status: She is alert and oriented to person, place, and time. Mental status is at baseline.     Cranial Nerves: No cranial nerve deficit.     Sensory: No sensory deficit.     Motor: No weakness.     Coordination: Coordination normal.  Psychiatric:        Mood and Affect: Mood normal.        Behavior: Behavior normal.        Thought Content: Thought content normal.        Judgment: Judgment normal.       Assessment & Plan     Problem List Items Addressed This Visit   None  Gastrointestinal Disorder/ Gastroparesis  Persistent stomach issues with spasms. Stable on current medications. -Order labs to assess current status. -Refill Vitamin D prescription.  Prediabetes Slight increase in levels from 5.7 to 6. Stable weight and blood pressure. -Order labs to monitor glucose levels.  Sleep Apnea Difficulty with current sleep appliance due to discomfort and maintenance. -Referral to pulmonology for evaluation of alternative treatment options, including an implanted device.  General Health Maintenance -Eligible for Pap smear. Discussed options for location of procedure. -Complete lab work for routine monitoring of health status.  No follow-ups on file.      Leilani Merl, FNP, have reviewed  all documentation for this visit. The documentation on 01/18/23 for the exam, diagnosis, procedures, and orders are all accurate and complete.  Jacky Kindle, FNP  [facility] [phone] (phone) [phone] (fax)  [facility]

## 2023-01-19 LAB — COMPREHENSIVE METABOLIC PANEL
ALT: 26 [IU]/L (ref 0–32)
AST: 22 [IU]/L (ref 0–40)
Albumin: 4.4 g/dL (ref 3.9–4.9)
Alkaline Phosphatase: 62 [IU]/L (ref 44–121)
BUN/Creatinine Ratio: 11 (ref 9–23)
BUN: 12 mg/dL (ref 6–24)
Bilirubin Total: 0.6 mg/dL (ref 0.0–1.2)
CO2: 27 mmol/L (ref 20–29)
Calcium: 9.6 mg/dL (ref 8.7–10.2)
Chloride: 100 mmol/L (ref 96–106)
Creatinine, Ser: 1.14 mg/dL — ABNORMAL HIGH (ref 0.57–1.00)
Globulin, Total: 2.7 g/dL (ref 1.5–4.5)
Glucose: 90 mg/dL (ref 70–99)
Potassium: 3.8 mmol/L (ref 3.5–5.2)
Sodium: 140 mmol/L (ref 134–144)
Total Protein: 7.1 g/dL (ref 6.0–8.5)
eGFR: 62 mL/min/{1.73_m2} (ref >59)

## 2023-01-19 LAB — LIPID PANEL
Chol/HDL Ratio: 3 ratio (ref 0.0–4.4)
Cholesterol, Total: 189 mg/dL (ref 100–199)
HDL: 64 mg/dL (ref >39)
LDL Chol Calc (NIH): 103 mg/dL — ABNORMAL HIGH (ref 0–99)
Triglycerides: 124 mg/dL (ref 0–149)
VLDL Cholesterol Cal: 22 mg/dL (ref 5–40)

## 2023-01-19 LAB — VITAMIN D 25 HYDROXY (VIT D DEFICIENCY, FRACTURES): Vit D, 25-Hydroxy: 45 ng/mL (ref 30.0–100.0)

## 2023-01-19 LAB — CBC WITH DIFFERENTIAL/PLATELET
Basophils Absolute: 0 10*3/uL (ref 0.0–0.2)
Basos: 1 %
EOS (ABSOLUTE): 0.1 10*3/uL (ref 0.0–0.4)
Eos: 1 %
Hematocrit: 43.1 % (ref 34.0–46.6)
Hemoglobin: 13.5 g/dL (ref 11.1–15.9)
Immature Grans (Abs): 0 10*3/uL (ref 0.0–0.1)
Immature Granulocytes: 0 %
Lymphocytes Absolute: 2.9 10*3/uL (ref 0.7–3.1)
Lymphs: 46 %
MCH: 28.1 pg (ref 26.6–33.0)
MCHC: 31.3 g/dL — ABNORMAL LOW (ref 31.5–35.7)
MCV: 90 fL (ref 79–97)
Monocytes Absolute: 0.5 10*3/uL (ref 0.1–0.9)
Monocytes: 7 %
Neutrophils Absolute: 2.8 10*3/uL (ref 1.4–7.0)
Neutrophils: 45 %
Platelets: 334 10*3/uL (ref 150–450)
RBC: 4.8 x10E6/uL (ref 3.77–5.28)
RDW: 14.5 % (ref 11.7–15.4)
WBC: 6.3 10*3/uL (ref 3.4–10.8)

## 2023-01-19 LAB — HEMOGLOBIN A1C
Est. average glucose Bld gHb Est-mCnc: 128 mg/dL
Hgb A1c MFr Bld: 6.1 % — ABNORMAL HIGH (ref 4.8–5.6)

## 2023-01-19 LAB — TSH: TSH: 2.06 u[IU]/mL (ref 0.450–4.500)

## 2023-01-22 ENCOUNTER — Telehealth: Payer: Self-pay | Admitting: Family Medicine

## 2023-01-22 NOTE — Telephone Encounter (Signed)
Patient was informed of her FMLA paperwork being completed & faxed. A copy will be left up front for pick up.

## 2023-01-27 ENCOUNTER — Telehealth: Payer: Self-pay | Admitting: Family Medicine

## 2023-01-27 DIAGNOSIS — Z0279 Encounter for issue of other medical certificate: Secondary | ICD-10-CM

## 2023-01-27 NOTE — Telephone Encounter (Signed)
Patient was informed of her FMLA paperwork being completed & faxed. A copy will be left up front for pick up.

## 2023-01-28 DIAGNOSIS — Z1151 Encounter for screening for human papillomavirus (HPV): Secondary | ICD-10-CM | POA: Insufficient documentation

## 2023-01-28 DIAGNOSIS — F17200 Nicotine dependence, unspecified, uncomplicated: Secondary | ICD-10-CM | POA: Insufficient documentation

## 2023-02-01 ENCOUNTER — Other Ambulatory Visit (HOSPITAL_COMMUNITY)
Admission: RE | Admit: 2023-02-01 | Discharge: 2023-02-01 | Disposition: A | Discharge status: Home / Self Care | Payer: BC Managed Care – PPO | Source: Ambulatory Visit | Attending: Certified Nurse Midwife | Admitting: Certified Nurse Midwife

## 2023-02-01 ENCOUNTER — Encounter: Payer: Self-pay | Admitting: Certified Nurse Midwife

## 2023-02-01 ENCOUNTER — Ambulatory Visit (INDEPENDENT_AMBULATORY_CARE_PROVIDER_SITE_OTHER): Payer: BC Managed Care – PPO | Admitting: Certified Nurse Midwife

## 2023-02-01 VITALS — BP 111/79 | HR 82 | Ht 63.0 in | Wt 229.0 lb

## 2023-02-01 DIAGNOSIS — Z1151 Encounter for screening for human papillomavirus (HPV): Secondary | ICD-10-CM | POA: Diagnosis not present

## 2023-02-01 DIAGNOSIS — Z01419 Encounter for gynecological examination (general) (routine) without abnormal findings: Secondary | ICD-10-CM | POA: Diagnosis not present

## 2023-02-01 DIAGNOSIS — Z124 Encounter for screening for malignant neoplasm of cervix: Secondary | ICD-10-CM | POA: Insufficient documentation

## 2023-02-01 DIAGNOSIS — Z113 Encounter for screening for infections with a predominantly sexual mode of transmission: Secondary | ICD-10-CM | POA: Diagnosis not present

## 2023-02-01 DIAGNOSIS — Z7689 Persons encountering health services in other specified circumstances: Secondary | ICD-10-CM

## 2023-02-01 DIAGNOSIS — Z1231 Encounter for screening mammogram for malignant neoplasm of breast: Secondary | ICD-10-CM

## 2023-02-01 NOTE — Patient Instructions (Signed)

## 2023-02-01 NOTE — Progress Notes (Signed)
ANNUAL EXAM Patient name: Jo Soto MRN [id number]  Date of birth: 03-Feb-1981 Chief Complaint:   Establish Care and Annual Exam  History of Present Illness:   Jo Soto is a 42 y.o. G63P1011 African-American female being seen today for a routine annual exam.  Current complaints: none, needs IUD exchanged this year, happy with Mirena due to no cycle, but did recently have spotting.  No LMP recorded. (Menstrual status: IUD).   The pregnancy intention screening data noted above was reviewed. Potential methods of contraception were discussed. The patient elected to proceed with No data recorded.   No results found for: "DIAGPAP", "HPVHIGH", "ADEQPAP"     Last pap 2023. Results were: negative per pt report at [facility] . H/O abnormal pap: yes, remote, no recent Last mammogram: 2023. Results were: normal. Family h/o breast cancer: no Last colonoscopy: n/a. Results were: N/A. Family h/o colorectal cancer: yes great grandmother     01/18/2023    1:33 PM 08/12/2022    4:03 PM 01/19/2022    8:42 AM 01/07/2022    4:15 PM 09/01/2021    3:07 PM  Depression screen PHQ 2/9  Decreased Interest 0 0 0 1 2  Down, Depressed, Hopeless 0 0 1 1 1   PHQ - 2 Score 0 0 1 2 3   Altered sleeping 3 3 1 1 3   Tired, decreased energy 3 2 1 1 3   Change in appetite 3 3 1 2 3   Feeling bad or failure about yourself  0 0 0 0 0  Trouble concentrating 0 0 0 1 0  Moving slowly or fidgety/restless 0 0 0 0 0  Suicidal thoughts 0 0 0 0 0  PHQ-9 Score 9 8 4 7 12   Difficult doing work/chores Somewhat difficult Not difficult at all Not difficult at all Not difficult at all Somewhat difficult        01/18/2023    1:33 PM  GAD 7 : Generalized Anxiety Score  Nervous, Anxious, on Edge 0  Control/stop worrying 1  Worry too much - different things 1  Trouble relaxing 1  Restless 0  Easily annoyed or irritable 1  Afraid - awful might happen 0  Total GAD 7 Score 4  Anxiety Difficulty  Somewhat difficult     Review of Systems:   Pertinent items are noted in HPI Denies any headaches, blurred vision, fatigue, shortness of breath, chest pain, abdominal pain, abnormal vaginal discharge/itching/odor/irritation, problems with periods, bowel movements, urination, or intercourse unless otherwise stated above. Pertinent History Reviewed:  Reviewed past medical,surgical, social and family history.  Reviewed problem list, medications and allergies. Physical Assessment:   Vitals:   02/01/23 1331  BP: 111/79  Pulse: 82  Weight: 229 lb (103.9 kg)  Height: 5\' 3"  (1.6 m)  Body mass index is 40.57 kg/m.       Physical Exam Vitals reviewed.  Constitutional:      Appearance: Normal appearance. She is obese.  HENT:     Head: Normocephalic.  Neck:     Thyroid: No thyroid mass or thyromegaly.  Cardiovascular:     Rate and Rhythm: Normal rate and regular rhythm.     Heart sounds: Normal heart sounds.  Pulmonary:     Effort: Pulmonary effort is normal.     Breath sounds: Normal breath sounds.  Chest:  Breasts:    Tanner Score is 5.     Right: Normal.     Left: Normal.  Abdominal:     Palpations:  Abdomen is soft.     Tenderness: There is no abdominal tenderness.  Genitourinary:    General: Normal vulva.     Vagina: Normal.     Cervix: Friability present. No cervical motion tenderness.     Uterus: Normal.      Adnexa:        Right: No mass or tenderness.         Left: No mass or tenderness.    Musculoskeletal:     Cervical back: Neck supple. No tenderness.  Skin:    General: Skin is warm and dry.  Neurological:     General: No focal deficit present.     Mental Status: She is alert and oriented to person, place, and time.  Psychiatric:        Mood and Affect: Mood normal.        Behavior: Behavior normal.      No results found for this or any previous visit (from the past 24 hour(s)).  Assessment & Plan:  1. Well woman exam with routine gynecological  exam  2. Cervical cancer screening  3. Encounter for screening for human papillomavirus (HPV)  4. Breast cancer screening by mammogram  5. Encounter to establish care  6. Routine screening for STI (sexually transmitted infection)   Mammogram: schedule screening mammo as soon as possible, or sooner if problems Colonoscopy: @ 41yo, or sooner if problems  No orders of the defined types were placed in this encounter. Will schedule IUD removal & replacement Declines HIV/RPR screening today, vaginal cultures collected for screening Established with PCP and reports recent screening lab work completed.  Meds: No orders of the defined types were placed in this encounter.   Follow-up: Return in 1 year (on 02/01/2024), or IUD replacement, for Annual exam.  Dominica Severin, CNM 02/01/2023 2:14 PM

## 2023-02-03 LAB — CERVICOVAGINAL ANCILLARY ONLY
Chlamydia: NEGATIVE
Comment: NEGATIVE
Comment: NEGATIVE
Comment: NORMAL
Neisseria Gonorrhea: NEGATIVE
Trichomonas: NEGATIVE

## 2023-02-04 LAB — CYTOLOGY - PAP
Comment: NEGATIVE
Diagnosis: NEGATIVE
High risk HPV: NEGATIVE

## 2023-03-01 ENCOUNTER — Ambulatory Visit (INDEPENDENT_AMBULATORY_CARE_PROVIDER_SITE_OTHER): Payer: BC Managed Care – PPO | Admitting: Certified Nurse Midwife

## 2023-03-01 VITALS — BP 119/80 | HR 83 | Wt 233.7 lb

## 2023-03-01 DIAGNOSIS — Z30433 Encounter for removal and reinsertion of intrauterine contraceptive device: Secondary | ICD-10-CM

## 2023-03-01 MED ORDER — LEVONORGESTREL 20 MCG/DAY IU IUD
1.0000 | INTRAUTERINE_SYSTEM | Freq: Once | INTRAUTERINE | Status: AC
  Administered 2023-03-01: 1 via INTRAUTERINE

## 2023-03-01 NOTE — Progress Notes (Signed)
   Chief Complaint  Patient presents with   Contraception     IUD PROCEDURE NOTE:  Jo Soto is a 42 y.o. G2P1011 here for Mirena IUD removal and reinsertion for contraception & menstrual cycle management   BP 119/80   Pulse 83   Wt 233 lb 11.2 oz (106 kg)   BMI 41.40 kg/m   IUD Removal  Patient was in the dorsal lithotomy position, normal external genitalia was noted.  A speculum was placed in the patient's vagina, normal discharge was noted, no lesions. The multiparous cervix was visualized, no lesions, no abnormal discharge. The strings of the IUD were grasped and pulled using ring forceps. The IUD was removed in its entirety and shown to patient.    IUD Insertion Procedure Note Patient identified, informed consent performed, consent signed.  Discussed risks of irregular bleeding, cramping, infection, malpositioning or misplacement of the IUD outside the uterus which may require further procedure such as laparoscopy, risk of failure <1%. Time out was performed.   Speculum placed in the vagina. Cervix visualized. Cleaned with Betadine x 2. Grasped anteriorly with a Allis clamp. Uterus sounded to 7 cm.  IUD placed per manufacturer's recommendations. Strings trimmed to 3 cm. Allis was removed, good hemostasis noted.  Patient tolerated procedure well.   ASSESSMENT:  Encounter for IUD removal and reinsertion - Plan: levonorgestrel (MIRENA) 20 MCG/DAY IUD 1 each   Meds ordered this encounter  Medications   levonorgestrel (MIRENA) 20 MCG/DAY IUD 1 each     Plan:  Patient was given post-procedure instructions.   Call if you are having increasing pain, cramps or bleeding or if you have a fever greater than 100.4 degrees F., shaking chills, nausea or vomiting. Patient was also asked to check IUD strings periodically and follow up in 4 weeks for IUD check.  No follow-ups on file.

## 2023-04-05 ENCOUNTER — Ambulatory Visit (INDEPENDENT_AMBULATORY_CARE_PROVIDER_SITE_OTHER): Payer: BC Managed Care – PPO | Admitting: Certified Nurse Midwife

## 2023-04-05 VITALS — BP 112/82 | HR 80 | Wt 230.4 lb

## 2023-04-05 DIAGNOSIS — Z30431 Encounter for routine checking of intrauterine contraceptive device: Secondary | ICD-10-CM

## 2023-04-05 NOTE — Progress Notes (Signed)
    GYNECOLOGY OFFICE ENCOUNTER NOTE  History:  43 y.o. G2P1011 here today for today for IUD string check; Mirena   IUD was placed  03/01/23. No complaints about the IUD, no concerning side effects.  The following portions of the patient's history were reviewed and updated as appropriate: allergies, current medications, past family history, past medical history, past social history, past surgical history and problem list. Last pap smear on 02/01/23 was normal, negative HRHPV.  Review of Systems:  Pertinent items are noted in HPI.  Objective:  Physical Exam Blood pressure 112/82, pulse 80, weight 230 lb 6.4 oz (104.5 kg). CONSTITUTIONAL: Well-developed, well-nourished female in no acute distress.  NEUROLOGIC: Alert and oriented to person, place, and time. Normal reflexes, muscle tone coordination.  ABDOMEN: Soft, no distention noted.   PELVIC: Normal appearing external genitalia; normal appearing vaginal mucosa and cervix.  IUD strings visualized, about 3 cm in length outside cervix.  EXTREMITIES: Non-tender, no edema or cyanosis  Assessment & Plan:  Patient to keep IUD in place for up to 8 years; can come in for removal if she desires pregnancy earlier or for any concerning side effects.    Harlene LITTIE Cisco, CNM

## 2023-05-05 ENCOUNTER — Other Ambulatory Visit: Payer: Self-pay | Admitting: Family Medicine

## 2023-05-05 NOTE — Telephone Encounter (Signed)
[facility] is requesting refill Vitamin D, Ergocalciferol, (DRISDOL) 1.25 MG (50000 UNIT) CAPS capsule  Please advise

## 2023-05-06 MED ORDER — VITAMIN D (ERGOCALCIFEROL) 1.25 MG (50000 UNIT) PO CAPS: 50000.0000 [IU] | ORAL_CAPSULE | ORAL | 0 refills | Status: DC

## 2023-07-26 ENCOUNTER — Other Ambulatory Visit: Payer: Self-pay | Admitting: Family Medicine

## 2023-07-26 ENCOUNTER — Encounter

## 2023-07-26 ENCOUNTER — Telehealth: Admitting: Physician Assistant

## 2023-07-26 DIAGNOSIS — J019 Acute sinusitis, unspecified: Secondary | ICD-10-CM

## 2023-07-26 DIAGNOSIS — B9689 Other specified bacterial agents as the cause of diseases classified elsewhere: Secondary | ICD-10-CM | POA: Diagnosis not present

## 2023-07-26 MED ORDER — AMOXICILLIN-POT CLAVULANATE 875-125 MG PO TABS: 1.0000 | ORAL_TABLET | Freq: Two times a day (BID) | ORAL | 0 refills | Status: DC

## 2023-07-26 MED ORDER — IPRATROPIUM BROMIDE 0.03 % NA SOLN
2.0000 | Freq: Two times a day (BID) | NASAL | 0 refills | Status: DC
Start: 2023-07-26 — End: 2023-11-17

## 2023-07-26 NOTE — Patient Instructions (Signed)
 Jo Soto, thank you for joining [name], PA-C for today's virtual visit.  While this provider is not your primary care provider (PCP), if your PCP is located in our provider database this encounter information will be shared with them immediately following your visit.   [name] MyChart account gives you access to today's visit and all your visits, tests, and labs performed at [name] " click here if you don't have a [name] MyChart account or go to [email or web address]  Consent: (Patient) Jo Soto provided verbal consent for this virtual visit at the beginning of the encounter.  Current Medications:  Current Outpatient Medications:    amoxicillin-clavulanate (AUGMENTIN) 875-125 MG tablet, Take 1 tablet by mouth 2 (two) times daily., Disp: 14 tablet, Rfl: 0   dicyclomine  (BENTYL ) 10 MG capsule, Take 1 capsule (10 mg total) by mouth 4 (four) times daily -  before meals and at bedtime., Disp: 120 capsule, Rfl: 11   ipratropium (ATROVENT) 0.03 % nasal spray, Place 2 sprays into both nostrils every 12 (twelve) hours., Disp: 30 mL, Rfl: 0   levonorgestrel  (MIRENA ) 20 MCG/DAY IUD, 1 each by Intrauterine route once., Disp: , Rfl:    losartan -hydrochlorothiazide (HYZAAR) 100-25 MG tablet, TAKE 1 TABLET BY MOUTH EVERY DAY, Disp: 90 tablet, Rfl: 3   Multiple Vitamin (MULTIVITAMIN WITH MINERALS) TABS tablet, Take 1 tablet by mouth in the morning., Disp: , Rfl:    sucralfate  (CARAFATE ) 1 g tablet, Take 1 tablet (1 g total) by mouth 4 (four) times daily -  with meals and at bedtime., Disp: 120 tablet, Rfl: 11   traMADol  (ULTRAM ) 50 MG tablet, Take 1 tablet (50 mg total) by mouth 3 (three) times daily as needed for severe pain. (Patient not taking: Reported on 02/01/2023), Disp: 15 tablet, Rfl: 0   Vitamin D , Ergocalciferol , (DRISDOL ) 1.25 MG (50000 UNIT) CAPS capsule, TAKE 1 CAPSULE (50,000 UNITS TOTAL) BY MOUTH EVERY 7 (SEVEN) DAYS. NEED VIT D CHECK  WHEN COMPLETE, Disp: 12 capsule, Rfl: 0   Medications ordered in this encounter:  Meds ordered this encounter  Medications   amoxicillin-clavulanate (AUGMENTIN) 875-125 MG tablet    Sig: Take 1 tablet by mouth 2 (two) times daily.    Dispense:  14 tablet    Refill:  0    Supervising Provider:   LAMPTEY, PHILIP O [[id number]]   ipratropium (ATROVENT) 0.03 % nasal spray    Sig: Place 2 sprays into both nostrils every 12 (twelve) hours.    Dispense:  30 mL    Refill:  0    Supervising Provider:   Corine Dice [[id number]]     *If you need refills on other medications prior to your next appointment, please contact your pharmacy*  Follow-Up: Call back or seek an in-person evaluation if the symptoms worsen or if the condition fails to improve as anticipated.  [name] [phone]  Other Instructions  Postnasal Drip Postnasal drip is the feeling of mucus going down the back of your throat. Mucus is a slimy substance that moistens and cleans your nose and throat, as well as the air pockets in face bones near your forehead and cheeks (sinuses). Small amounts of mucus pass from your nose and sinuses down the back of your throat all the time. This is normal. When you produce too much mucus or the mucus gets too thick, you can feel it. Some common causes of postnasal drip include: Having more mucus because of: A cold or  the flu. Allergies. Cold air. Certain medicines. Gastroesophageal reflux. Having more mucus that is thicker because of: A sinus or nasal infection. Dry air. A food allergy. Follow these instructions at home: Relieving discomfort  Gargle with a mixture of salt and water 3-4 times a day or as needed. To make salt water, completely dissolve -1 tsp (3-6 g) of salt in 1 cup (237 mL) of warm water. If the air in your home is dry, use a humidifier to add moisture to the air. Use a saline spray or a container (neti pot) to flush out the nose (nasal  irrigation). These methods can help clear away mucus and keep the nasal passages moist. General instructions Take over-the-counter and prescription medicines only as told by your health care provider. Follow instructions from your health care provider about eating or drinking restrictions. You may need to avoid caffeine. Avoid things that you know you are allergic to (allergens), like dust, mold, pollen, pets, or certain foods. Drink enough fluid to keep your urine pale yellow. Keep all follow-up visits. This is important. Contact a health care provider if: You have a fever. You have a sore throat or difficulty swallowing. You have a headache. You have sinus or ear pain. You have a cough that does not go away. The mucus from your nose becomes thick and is green or yellow in color. You have cold or flu symptoms that last more than 10 days. Summary Postnasal drip is the feeling of mucus going down the back of your throat. Use nasal irrigation or a nasal spray to help clear away mucus and keep the nasal passages moist. Avoid things that you know you are allergic to (allergens), like dust, mold, pollen, pets, or certain foods. This information is not intended to replace advice given to you by your health care provider. Make sure you discuss any questions you have with your health care provider. Document Revised: 02/06/2021 Document Reviewed: 02/06/2021 Elsevier Patient Education  2024 Elsevier Inc.   If you have been instructed to have an in-person evaluation today at a local Urgent Care facility, please use the link below. It will take you to a list of all of our available [name], including address, phone number and hours of operation. Please do not delay care.  [name]  If you or a family member do not have a primary care provider, use the link below to schedule a visit and establish care. When you choose a [facility] primary care physician or advanced  practice provider, you gain a long-term partner in health. Find a Primary Care Provider  Learn more about [facility]'s in-office and virtual care options: [facility] - Get Care Now

## 2023-07-26 NOTE — Progress Notes (Signed)
 Virtual Visit Consent   Jo Soto, you are scheduled for a virtual visit with a [facility] provider today. Just as with appointments in the office, your consent must be obtained to participate. Your consent will be active for this visit and any virtual visit you may have with one of our providers in the next 365 days. If you have a MyChart account, a copy of this consent can be sent to you electronically.  As this is a virtual visit, video technology does not allow for your provider to perform a traditional examination. This may limit your provider's ability to fully assess your condition. If your provider identifies any concerns that need to be evaluated in person or the need to arrange testing (such as labs, EKG, etc.), we will make arrangements to do so. Although advances in technology are sophisticated, we cannot ensure that it will always work on either your end or our end. If the connection with a video visit is poor, the visit may have to be switched to a telephone visit. With either a video or telephone visit, we are not always able to ensure that we have a secure connection.  By engaging in this virtual visit, you consent to the provision of healthcare and authorize for your insurance to be billed (if applicable) for the services provided during this visit. Depending on your insurance coverage, you may receive a charge related to this service.  I need to obtain your verbal consent now. Are you willing to proceed with your visit today? Nastasha Varriale has provided verbal consent on 07/26/2023 for a virtual visit (video or telephone). [name], PA-C  Date: 07/26/2023 6:21 PM   Virtual Visit via Video Note   I, [name], connected with  Jo Soto  ([id number], 1980-10-12) on 07/26/23 at  6:15 PM EDT by a video-enabled telemedicine application and verified that I am speaking with the correct person using two identifiers.  Location: Patient: Virtual Visit Location  Patient: Home Provider: Virtual Visit Location Provider: Home Office   I discussed the limitations of evaluation and management by telemedicine and the availability of in person appointments. The patient expressed understanding and agreed to proceed.    History of Present Illness: Jo Soto is a 43 y.o. who identifies as a female who was assigned female at birth, and is being seen today for sinus congestion and post nasal drip.  HPI: Sinusitis This is a new problem. The current episode started in the past 7 days. The problem has been gradually worsening since onset. There has been no fever. The pain is moderate. Associated symptoms include congestion, coughing (productive), ear pain (fullness and stopped up), headaches, a hoarse voice, sinus pressure and a sore throat (just today). Pertinent negatives include no chills, diaphoresis or shortness of breath. (Rhinorrhea and post nasal drainage) Treatments tried: generic cold and flu medication. The treatment provided no relief.      Problems:  Patient Active Problem List   Diagnosis Date Noted   Tobacco dependence 01/28/2023   Screening for human papillomavirus (HPV) 01/28/2023   Moderate mixed hyperlipidemia not requiring statin therapy 08/12/2022   Gastroparesis 08/12/2022   Chronic bilateral low back pain without sciatica 07/30/2022   OSA on CPAP 01/19/2022   Need for influenza vaccination 01/19/2022   Annual physical exam 01/19/2022   Prediabetes 01/19/2022   Avitaminosis D 01/19/2022   Personal history of nicotine dependence 01/19/2022   Morbid obesity (HCC) 09/01/2021   Primary hypertension 09/01/2021   Depression, recurrent (  HCC) 07/21/2021    Allergies: No Active Allergies Medications:  Current Outpatient Medications:    amoxicillin-clavulanate (AUGMENTIN) 875-125 MG tablet, Take 1 tablet by mouth 2 (two) times daily., Disp: 14 tablet, Rfl: 0   dicyclomine  (BENTYL ) 10 MG capsule, Take 1 capsule (10 mg total) by mouth 4  (four) times daily -  before meals and at bedtime., Disp: 120 capsule, Rfl: 11   ipratropium (ATROVENT) 0.03 % nasal spray, Place 2 sprays into both nostrils every 12 (twelve) hours., Disp: 30 mL, Rfl: 0   levonorgestrel  (MIRENA ) 20 MCG/DAY IUD, 1 each by Intrauterine route once., Disp: , Rfl:    losartan -hydrochlorothiazide (HYZAAR) 100-25 MG tablet, TAKE 1 TABLET BY MOUTH EVERY DAY, Disp: 90 tablet, Rfl: 3   Multiple Vitamin (MULTIVITAMIN WITH MINERALS) TABS tablet, Take 1 tablet by mouth in the morning., Disp: , Rfl:    sucralfate  (CARAFATE ) 1 g tablet, Take 1 tablet (1 g total) by mouth 4 (four) times daily -  with meals and at bedtime., Disp: 120 tablet, Rfl: 11   traMADol  (ULTRAM ) 50 MG tablet, Take 1 tablet (50 mg total) by mouth 3 (three) times daily as needed for severe pain. (Patient not taking: Reported on 02/01/2023), Disp: 15 tablet, Rfl: 0   Vitamin D , Ergocalciferol , (DRISDOL ) 1.25 MG (50000 UNIT) CAPS capsule, TAKE 1 CAPSULE (50,000 UNITS TOTAL) BY MOUTH EVERY 7 (SEVEN) DAYS. NEED VIT D CHECK WHEN COMPLETE, Disp: 12 capsule, Rfl: 0  Observations/Objective: Patient is well-developed, well-nourished in no acute distress.  Resting comfortably at home.  Head is normocephalic, atraumatic.  No labored breathing.  Speech is clear and coherent with logical content.  Patient is alert and oriented at baseline.    Assessment and Plan: 1. Acute bacterial sinusitis (Primary) - amoxicillin-clavulanate (AUGMENTIN) 875-125 MG tablet; Take 1 tablet by mouth 2 (two) times daily.  Dispense: 14 tablet; Refill: 0 - ipratropium (ATROVENT) 0.03 % nasal spray; Place 2 sprays into both nostrils every 12 (twelve) hours.  Dispense: 30 mL; Refill: 0  - Worsening symptoms that have not responded to OTC medications.  - Will give Augmentin - Ipratropium bromide for nasal drainage - Continue allergy medications.  - Steam and humidifier can help - Stay well hydrated and get plenty of rest.  - Seek in  person evaluation if no symptom improvement or if symptoms worsen   Follow Up Instructions: I discussed the assessment and treatment plan with the patient. The patient was provided an opportunity to ask questions and all were answered. The patient agreed with the plan and demonstrated an understanding of the instructions.  A copy of instructions were sent to the patient via MyChart unless otherwise noted below.    The patient was advised to call back or seek an in-person evaluation if the symptoms worsen or if the condition fails to improve as anticipated.    [name], PA-C

## 2023-08-21 IMAGING — CR DG ABDOMEN 2V
1 series · 3 of 3 positions shown · non-contrast
Comparison: None.

CLINICAL DATA: Right-sided abdominal pain with nausea, vomiting and
diarrhea.

EXAM:
ABDOMEN - 2 VIEW

[Series 1: dg abd 2 views · 0.14mm/px · 3 of 3 slices shown]
[im 1/3]
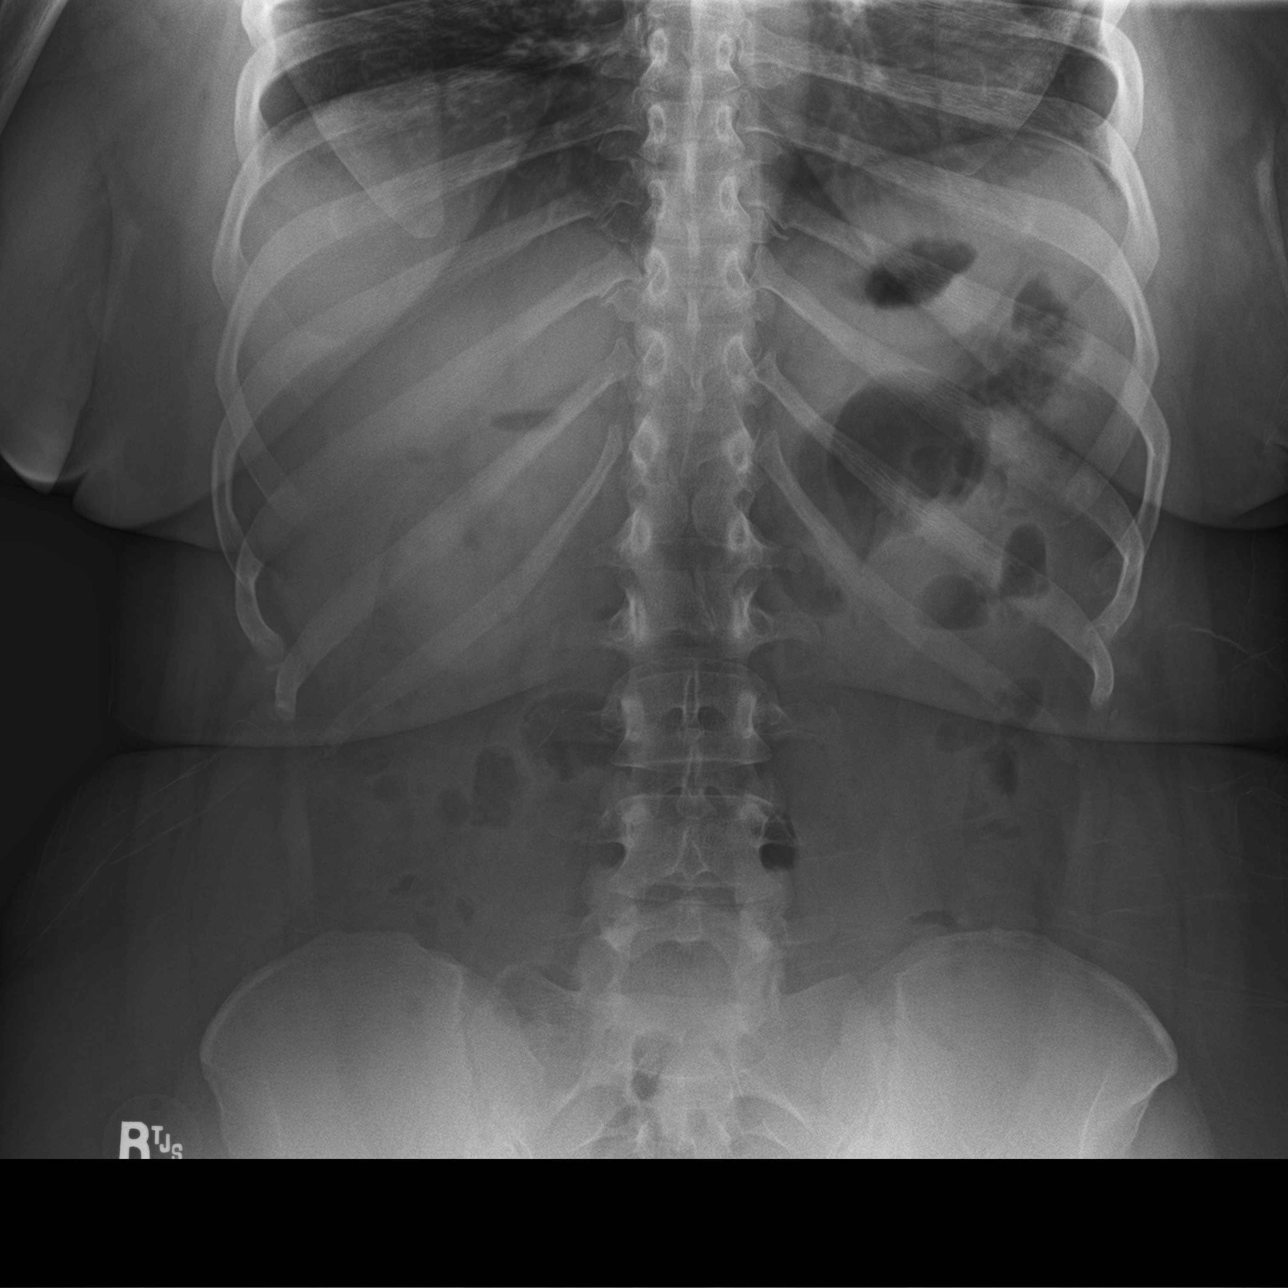
[im 2/3]
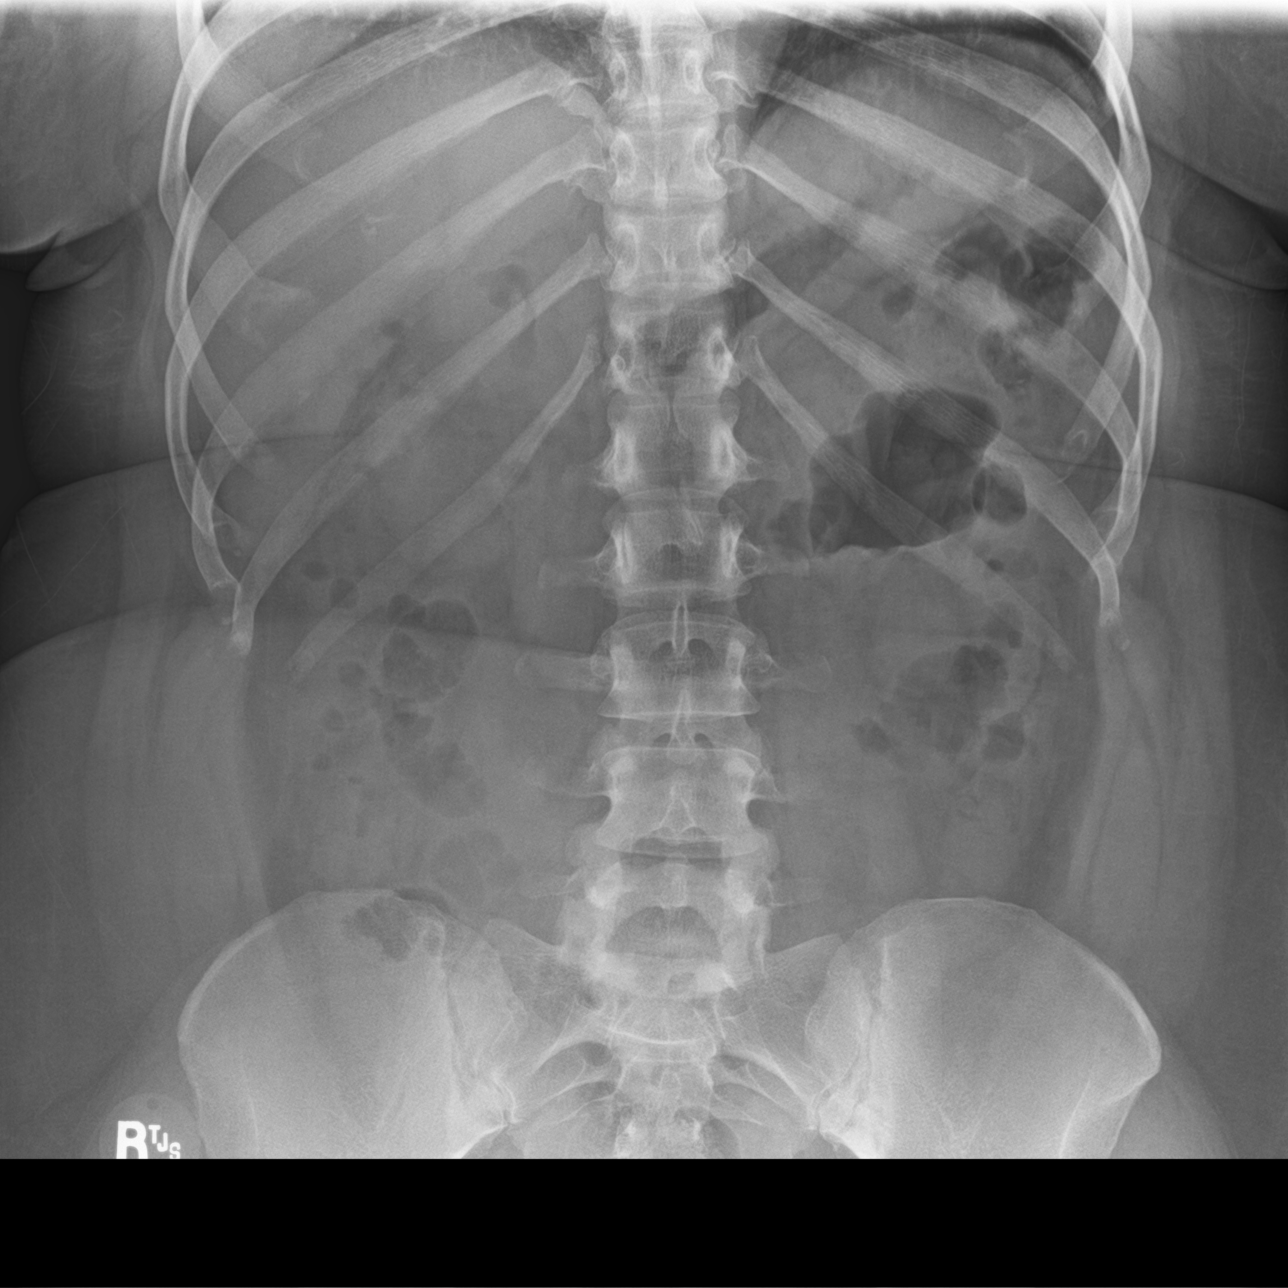
[im 3/3]
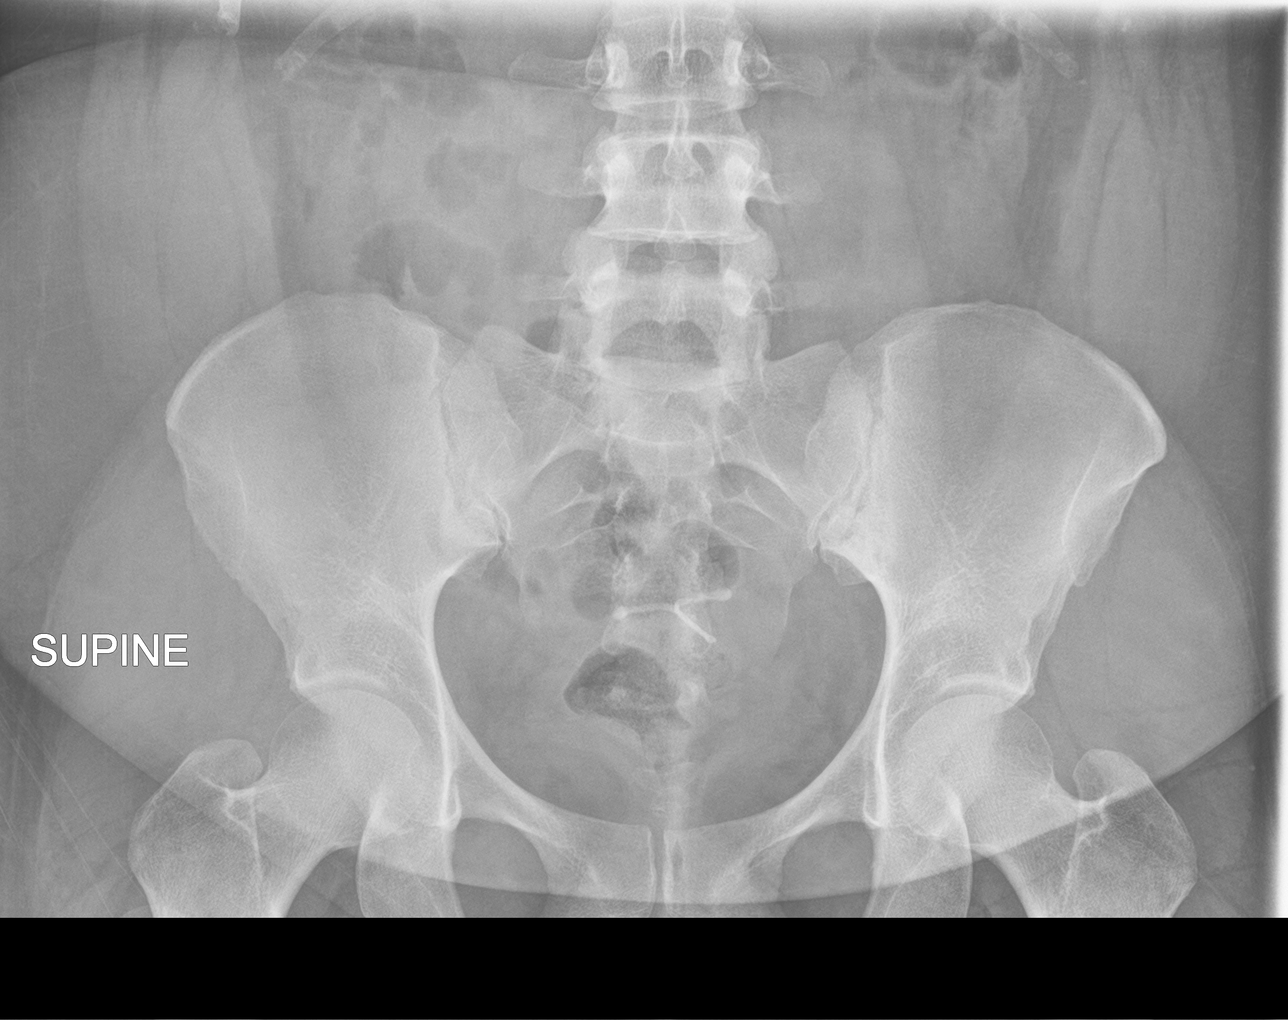

[3 of 3 positions shown; findings below may reference images not displayed]

FINDINGS: The bowel gas pattern is normal. There is no evidence of free air.
An IUD is in place. No radio-opaque calculi or other significant
radiographic abnormality is seen.
IMPRESSION: Negative.

## 2023-10-14 ENCOUNTER — Other Ambulatory Visit: Payer: Self-pay | Admitting: Family Medicine

## 2023-10-26 ENCOUNTER — Ambulatory Visit: Admitting: Family Medicine

## 2023-11-17 ENCOUNTER — Ambulatory Visit (INDEPENDENT_AMBULATORY_CARE_PROVIDER_SITE_OTHER): Admitting: Family Medicine

## 2023-11-17 ENCOUNTER — Encounter: Payer: Self-pay | Admitting: Family Medicine

## 2023-11-17 VITALS — BP 108/76 | HR 81 | Ht 63.0 in | Wt 231.0 lb

## 2023-11-17 DIAGNOSIS — J309 Allergic rhinitis, unspecified: Secondary | ICD-10-CM

## 2023-11-17 DIAGNOSIS — Z0000 Encounter for general adult medical examination without abnormal findings: Secondary | ICD-10-CM

## 2023-11-17 DIAGNOSIS — Z23 Encounter for immunization: Secondary | ICD-10-CM | POA: Diagnosis not present

## 2023-11-17 DIAGNOSIS — N182 Chronic kidney disease, stage 2 (mild): Secondary | ICD-10-CM

## 2023-11-17 DIAGNOSIS — Z0001 Encounter for general adult medical examination with abnormal findings: Secondary | ICD-10-CM | POA: Diagnosis not present

## 2023-11-17 DIAGNOSIS — K3184 Gastroparesis: Secondary | ICD-10-CM | POA: Diagnosis not present

## 2023-11-17 DIAGNOSIS — F17200 Nicotine dependence, unspecified, uncomplicated: Secondary | ICD-10-CM

## 2023-11-17 DIAGNOSIS — E782 Mixed hyperlipidemia: Secondary | ICD-10-CM

## 2023-11-17 DIAGNOSIS — I10 Essential (primary) hypertension: Secondary | ICD-10-CM

## 2023-11-17 DIAGNOSIS — R222 Localized swelling, mass and lump, trunk: Secondary | ICD-10-CM | POA: Diagnosis not present

## 2023-11-17 DIAGNOSIS — M545 Low back pain, unspecified: Secondary | ICD-10-CM

## 2023-11-17 DIAGNOSIS — E559 Vitamin D deficiency, unspecified: Secondary | ICD-10-CM

## 2023-11-17 DIAGNOSIS — R110 Nausea: Secondary | ICD-10-CM

## 2023-11-17 DIAGNOSIS — F339 Major depressive disorder, recurrent, unspecified: Secondary | ICD-10-CM

## 2023-11-17 DIAGNOSIS — Z716 Tobacco abuse counseling: Secondary | ICD-10-CM

## 2023-11-17 DIAGNOSIS — G8929 Other chronic pain: Secondary | ICD-10-CM

## 2023-11-17 DIAGNOSIS — R7303 Prediabetes: Secondary | ICD-10-CM

## 2023-11-17 MED ORDER — LOSARTAN POTASSIUM-HCTZ 100-25 MG PO TABS: 1.0000 | ORAL_TABLET | Freq: Every day | ORAL | 3 refills | Status: AC

## 2023-11-17 MED ORDER — TIZANIDINE HCL 2 MG PO TABS: 2.0000 mg | ORAL_TABLET | Freq: Four times a day (QID) | ORAL | 1 refills | Status: AC | PRN

## 2023-11-17 MED ORDER — ONDANSETRON 4 MG PO TBDP
4.0000 mg | ORAL_TABLET | Freq: Three times a day (TID) | ORAL | 1 refills | Status: AC | PRN
Start: 2023-11-17 — End: ?

## 2023-11-17 MED ORDER — SUCRALFATE 1 G PO TABS: 1.0000 g | ORAL_TABLET | Freq: Three times a day (TID) | ORAL | 11 refills | Status: AC

## 2023-11-17 NOTE — Patient Instructions (Signed)
 Take one allergy medication (one tablet per night)    Least effective --> most effective Loratadine -> cetirizine or fexofenadine -> levocetirizine  You can also consider using Flonase for the postnasal drip.

## 2023-11-17 NOTE — Progress Notes (Unsigned)
 Complete physical exam   Patient: Jo Soto   DOB: March 22, 1981   43 y.o. Female  MRN: [id number] Visit Date: 11/17/2023  Today's healthcare provider: LAURAINE LOISE BUOY, DO   Chief Complaint  Patient presents with   Annual Exam   Care Management    Pattern of eating:General   Are you exercising:no     Vaccine: agreed to vaccine, coming back for HPV and Hep they are not in clinic       Subjective    Jo Soto is a 43 y.o. female who presents today for a complete physical exam.  She reports consuming a general diet. The patient does not participate in regular exercise at present. She generally feels well. She reports sleeping well. She does not have additional problems to discuss today.   HPI HPI     Care Management    Additional comments: Pattern of eating:General   Are you exercising:no     Vaccine: agreed to vaccine, coming back for HPV and Hep they are not in clinic          Last edited by Thelbert Eulalio HERO, CMA on 11/17/2023  4:22 PM.       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  She has been experiencing eye itching for the past few days, primarily in one eye, with a sensation of a foreign body. She attributes this to possible weather changes and finds it very irritating. She reports occasional headaches, sinus drainage, and post-nasal drip, which worsen when lying down. She reports sinus drainage and post-nasal drip, which worsen when lying down, and has tried various allergy medications in the past.  She is not currently taking anything for allergies.  She mentions a history of a fatty tumor in 2013, which was surgically removed in [place] . She now feels a similar mass in the same area, within her rib cage, which moves when she bends over. She recalls being informed that such tumors can recur in different places.  Her current medications include  losartan -hydrochlorothiazide, vitamin D , and sucralfate , the latter most of which she takes between one and four times a day as needed. She recently completed a course of Augmentin  following a tooth extraction. She is not currently taking Bentyl  (dicyclomine ) as it was not effective for her.  She has not followed up with gastroenterology since 2023.  She smokes two cigarettes a day. She has tried bupropion  in the past but did not like its effects. She experiences back pain two to three times a week, which has been particularly bothersome recently. She uses a back massager and menthol cream for relief but avoids NSAIDs due to stomach issues. Muscle relaxers she has used previously, including cyclobenzaprine , tizanidine  and methocarbamol, make her too sleepy for daytime use and sometimes left her feeling groggy when she took them at night.  She experiences intermittent nausea and vomiting, which she attributes to gastroparesis. She takes omeprazole  and occasionally experiences aspiration at night. She has a Mirena  IUD and experiences occasional spotting but no regular periods.      Past Medical History:  Diagnosis Date   Allergy    Anxiety    Depression    GERD (gastroesophageal reflux disease)    Heartburn    Migraines    Sleep apnea    Past Surgical History:  Procedure Laterality Date   ANTERIOR CRUCIATE LIGAMENT REPAIR  12/2017   CHOLECYSTECTOMY  ESOPHAGOGASTRODUODENOSCOPY (EGD) WITH PROPOFOL  N/A 05/06/2020   Procedure: ESOPHAGOGASTRODUODENOSCOPY (EGD) WITH PROPOFOL ;  Surgeon: Therisa Bi, MD;  Location[facility] END[facility]COPY;  Service: Gastroenterology;  Laterality: N/A;  COVID POSITIVE 04/03/2020   ESOPHAGOGASTRODUODENOSCOPY (EGD) WITH PROPOFOL  N/A 08/20/2021   Procedure: ESOPHAGOGASTRODUODENOSCOPY (EGD) WITH PROPOFOL ;  Surgeon: Therisa Bi, MD;  Location: [facility] ENDOSCOPY;  Service: Gastroenterology;  Laterality: N/A;   HERNIA REPAIR     ROBOTIC ASSISTED LAPAROSCOPIC CHOLECYSTECTOMY   07/02/2020   TUMOR REMOVAL  12/2014   Abdominal   XI ROBOTIC ASSISTED VENTRAL HERNIA N/A 03/11/2021   Procedure: XI ROBOTIC ASSISTED VENTRAL HERNIA, incisional hernia repair with mesh;  Surgeon: Desiderio Schanz, MD;  Location: [facility] ORS;  Service: General;  Laterality: N/A;   Social History   Socioeconomic History   Marital status: Divorced    Spouse name: Not on file   Number of children: Not on file   Years of education: Not on file   Highest education level: Associate degree: occupational, Scientist, product/process development, or vocational program  Occupational History    Employer: [employer]  Tobacco Use   Smoking status: Some Days    Current packs/day: 0.10    Average packs/day: 0.1 packs/day for 8.0 years (0.8 ttl pk-yrs)    Types: Cigarettes    Passive exposure: Never   Smokeless tobacco: Never   Tobacco comments:    2 cigarettes per day  Vaping Use   Vaping status: Never Used  Substance and Sexual Activity   Alcohol use: Yes    Alcohol/week: 9.0 standard drinks of alcohol    Types: 2 Glasses of wine, 6 Cans of beer, 1 Shots of liquor per week    Comment: 2-3 weekly   Drug use: Never   Sexual activity: Yes  Other Topics Concern   Not on file  Social History Narrative   Not on file   Social Drivers of Health   Financial Resource Strain: Medium Risk (11/16/2023)   Overall Financial Resource Strain (CARDIA)    Difficulty of Paying Living Expenses: Somewhat hard  Food Insecurity: Food Insecurity Present (11/16/2023)   Hunger Vital Sign    Worried About Running Out of Food in the Last Year: Sometimes true    Ran Out of Food in the Last Year: Never true  Transportation Needs: No Transportation Needs (11/16/2023)   PRAPARE - Administrator, Civil Service (Medical): No    Lack of Transportation (Non-Medical): No  Physical Activity: Insufficiently Active (11/16/2023)   Exercise Vital Sign    Days of Exercise per Week: 2 days    Minutes of Exercise per Session: 30 min  Stress: No Stress  Concern Present (11/16/2023)   [employer] of Occupational Health - Occupational Stress Questionnaire    Feeling of Stress: Only a little  Social Connections: Moderately Isolated (11/16/2023)   Social Connection and Isolation Panel    Frequency of Communication with Friends and Family: More than three times a week    Frequency of Social Gatherings with Friends and Family: Once a week    Attends Religious Services: 1 to 4 times per year    Active Member of [employer] or Organizations: No    Attends Banker Meetings: Not on file    Marital Status: Divorced  Intimate Partner Violence: Not At Risk (11/17/2023)   Humiliation, Afraid, Rape, and Kick questionnaire    Fear of Current or Ex-Partner: No    Emotionally Abused: No    Physically Abused: No    Sexually Abused: No  Family Status  Relation Name Status   Mother  Alive   Father  Deceased   Sister  (Not Specified)   Mat Aunt  (Not Specified)   Mat Uncle  (Not Specified)  No partnership data on file   Family History  Problem Relation Age of Onset   Anemia Mother    Cancer Mother    Diabetes Mother    Heart disease Sister    Diabetes Maternal Aunt    Diabetes Maternal Uncle    No Active Allergies  Patient Care Team: Gloria Lambertson, Lauraine SAILOR, DO as PCP - General (Family Medicine)   Medications: Outpatient Medications Prior to Visit  Medication Sig   levonorgestrel  (MIRENA ) 20 MCG/DAY IUD 1 each by Intrauterine route once.   Multiple Vitamin (MULTIVITAMIN WITH MINERALS) TABS tablet Take 1 tablet by mouth in the morning.   Vitamin D , Ergocalciferol , (DRISDOL ) 1.25 MG (50000 UNIT) CAPS capsule Take 1 capsule (50,000 Units total) by mouth every 7 (seven) days. Need appointment   [DISCONTINUED] amoxicillin -clavulanate (AUGMENTIN ) 875-125 MG tablet Take 1 tablet by mouth 2 (two) times daily. (Patient not taking: Reported on 11/17/2023)   [DISCONTINUED] dicyclomine  (BENTYL ) 10 MG capsule Take 1 capsule (10 mg total) by mouth 4  (four) times daily -  before meals and at bedtime.   [DISCONTINUED] ipratropium (ATROVENT ) 0.03 % nasal spray Place 2 sprays into both nostrils every 12 (twelve) hours.   [DISCONTINUED] losartan -hydrochlorothiazide (HYZAAR) 100-25 MG tablet TAKE 1 TABLET BY MOUTH EVERY DAY   [DISCONTINUED] ondansetron  (ZOFRAN -ODT) 4 MG disintegrating tablet Take 4 mg by mouth every 8 (eight) hours as needed for nausea or vomiting.   [DISCONTINUED] sucralfate  (CARAFATE ) 1 g tablet Take 1 tablet (1 g total) by mouth 4 (four) times daily -  with meals and at bedtime.   [DISCONTINUED] traMADol  (ULTRAM ) 50 MG tablet Take 1 tablet (50 mg total) by mouth 3 (three) times daily as needed for severe pain. (Patient not taking: Reported on 02/01/2023)   No facility-administered medications prior to visit.    Review of Systems  Constitutional:  Negative for chills, fatigue and fever.  HENT:  Positive for postnasal drip. Negative for congestion, ear pain, rhinorrhea, sneezing and sore throat.   Eyes:  Positive for itching. Negative for pain and redness.  Respiratory:  Negative for cough, shortness of breath and wheezing.   Cardiovascular:  Negative for chest pain and leg swelling.  Gastrointestinal:  Positive for nausea. Negative for abdominal pain, blood in stool, constipation and diarrhea.  Endocrine: Negative for polydipsia and polyphagia.  Genitourinary: Negative.  Negative for dyspareunia, dysuria, flank pain, hematuria, pelvic pain, vaginal bleeding and vaginal discharge.  Musculoskeletal:  Positive for back pain. Negative for arthralgias, gait problem and joint swelling.  Skin:  Negative for rash.  Neurological:  Positive for headaches (1-2 per month). Negative for dizziness, tremors, seizures, weakness, light-headedness and numbness.  Hematological:  Negative for adenopathy.  Psychiatric/Behavioral: Negative.  Negative for behavioral problems, confusion and dysphoric mood. The patient is not nervous/anxious and is  not hyperactive.       Objective    BP 108/76 (BP Location: Right Leg, Patient Position: Sitting, Cuff Size: Normal)   Pulse 81   Ht 5' 3 (1.6 m)   Wt 231 lb (104.8 kg)   SpO2 100%   BMI 40.92 kg/m    Physical Exam Vitals and nursing note reviewed.  Constitutional:      General: She is awake.     Appearance: Normal appearance.  HENT:  Head: Normocephalic and atraumatic.     Right Ear: Tympanic membrane, ear canal and external ear normal.     Left Ear: Tympanic membrane, ear canal and external ear normal.     Nose: Nose normal.     Mouth/Throat:     Mouth: Mucous membranes are moist.     Pharynx: Oropharynx is clear. No oropharyngeal exudate or posterior oropharyngeal erythema.  Eyes:     General: No scleral icterus.    Extraocular Movements: Extraocular movements intact.     Conjunctiva/sclera: Conjunctivae normal.     Pupils: Pupils are equal, round, and reactive to light.  Neck:     Thyroid: No thyromegaly or thyroid tenderness.  Cardiovascular:     Rate and Rhythm: Normal rate and regular rhythm.     Pulses: Normal pulses.     Heart sounds: Normal heart sounds.  Pulmonary:     Effort: Pulmonary effort is normal. No tachypnea, bradypnea or respiratory distress.     Breath sounds: Normal breath sounds. No stridor. No wheezing, rhonchi or rales.  Abdominal:     General: Bowel sounds are normal. There is no distension.     Palpations: Abdomen is soft. There is no mass.     Tenderness: There is no abdominal tenderness. There is no guarding.     Hernia: No hernia is present.  Musculoskeletal:     Cervical back: Normal range of motion and neck supple.     Right lower leg: No edema.     Left lower leg: No edema.  Lymphadenopathy:     Cervical: No cervical adenopathy.  Skin:    General: Skin is warm and dry.  Neurological:     Mental Status: She is alert and oriented to person, place, and time. Mental status is at baseline.  Psychiatric:        Mood and Affect:  Mood normal.        Behavior: Behavior normal.      Last depression screening scores    11/17/2023    4:23 PM 01/18/2023    1:33 PM 08/12/2022    4:03 PM  PHQ 2/9 Scores  PHQ - 2 Score 0 0 0  PHQ- 9 Score 7 9 8    Last fall risk screening    11/18/2023   12:17 AM  Fall Risk   Falls in the past year? 0  Number falls in past yr: 0  Injury with Fall? 0  Risk for fall due to : No Fall Risks   Last Audit-C alcohol use screening    11/16/2023    4:24 PM  Alcohol Use Disorder Test (AUDIT)  1. How often do you have a drink containing alcohol? 3  2. How many drinks containing alcohol do you have on a typical day when you are drinking? 0  3. How often do you have six or more drinks on one occasion? 1  AUDIT-C Score 4   4. How often during the last year have you found that you were not able to stop drinking once you had started? 0  5. How often during the last year have you failed to do what was normally expected from you because of drinking? 0  6. How often during the last year have you needed a first drink in the morning to get yourself going after a heavy drinking session? 0  7. How often during the last year have you had a feeling of guilt of remorse after drinking? 0  8. How often  during the last year have you been unable to remember what happened the night before because you had been drinking? 0  9. Have you or someone else been injured as a result of your drinking? 0  10. Has a relative or friend or a doctor or another health worker been concerned about your drinking or suggested you cut down? 0  Alcohol Use Disorder Identification Test Final Score (AUDIT) 4      Patient-reported   A score of 3 or more in women, and 4 or more in men indicates increased risk for alcohol abuse, EXCEPT if all of the points are from question 1   No results found for any visits on 11/17/23.  Assessment & Plan    Routine Health Maintenance and Physical Exam  Exercise Activities and Dietary  recommendations  Goals      Quit Smoking        Immunization History  Administered Date(s) Administered   Influenza,inj,Quad PF,6+ Mos 05/26/2021, 01/19/2022   Tdap 08/12/2022    Health Maintenance  Topic Date Due   Pneumococcal Vaccine (1 of 2 - PCV) Never done   Hepatitis B Vaccines 19-59 Average Risk (1 of 3 - 19+ 3-dose series) 11/26/2023 (Originally 05/18/2000)   HPV VACCINES (1 - 3-dose SCDM series) 11/26/2023 (Originally 05/18/2008)   COVID-19 Vaccine (1 - 2024-25 season) 12/03/2023 (Originally 11/22/2022)   Cervical Cancer Screening (HPV/Pap Cotest)  02/01/2028   DTaP/Tdap/Td (2 - Td or Tdap) 08/11/2032   Hepatitis C Screening  Completed   HIV Screening  Completed   Meningococcal B Vaccine  Aged Out   INFLUENZA VACCINE  Discontinued    Discussed health benefits of physical activity, and encouraged her to engage in regular exercise appropriate for her age and condition.   Annual physical exam  Allergic rhinitis, unspecified seasonality, unspecified trigger  Mass of chest wall, left -     US  CHEST SOFT TISSUE; Future  Chronic bilateral low back pain without sciatica -     tiZANidine  HCl; Take 1 tablet (2 mg total) by mouth every 6 (six) hours as needed for muscle spasms.  Dispense: 30 tablet; Refill: 1 -     Ambulatory referral to Physical Therapy  Gastroparesis -     Vitamin B12 -     Sucralfate ; Take 1 tablet (1 g total) by mouth 4 (four) times daily -  with meals and at bedtime.  Dispense: 120 tablet; Refill: 11  Nausea -     Ondansetron ; Take 1 tablet (4 mg total) by mouth every 8 (eight) hours as needed for nausea or vomiting.  Dispense: 20 tablet; Refill: 1  Primary hypertension -     Losartan  Potassium-HCTZ; Take 1 tablet by mouth daily.  Dispense: 90 tablet; Refill: 3  Vitamin D  deficiency -     VITAMIN D  25 Hydroxy (Vit-D Deficiency, Fractures)  Nicotine dependence with current use  Encounter for smoking cessation counseling  Morbid obesity  (HCC)  Depression, recurrent (HCC)  Prediabetes -     Hemoglobin A1c  Moderate mixed hyperlipidemia not requiring statin therapy -     Lipid panel  Chronic kidney disease, stage 2, mildly decreased GFR -     Microalbumin / creatinine urine ratio -     Comprehensive metabolic panel with GFR  Encounter for Prevnar pneumococcal vaccination -     Pneumococcal conjugate vaccine 20-valent     Annual physical exam Physical exam overall unremarkable except as noted above. Routine lab work ordered as noted. Comprehensive evaluation  of chronic conditions, medication management, and health concerns. - Complete blood work including vitamin D  levels. - Refill losartan -hydrochlorothiazide prescription.  Allergic rhinitis with ocular symptoms Itchy eyes likely due to allergic rhinitis, possibly exacerbated by environmental factors. - Start loratadine. - Switch to cetirizine or fexofenadine if loratadine is ineffective. - Use eye drops for relief. - Avoid direct fan exposure.  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  Previous lipoma removed in 2013. - Order ultrasound of chest wall. - Refer to surgery if ultrasound confirms lipoma, per patient preference.  Chronic low back pain Chronic low back pain exacerbated by work, previous muscle relaxers caused drowsiness. - Prescribe tizanidine  2 mg for nighttime (lower dose than previously used). - Gave and reviewed handout for core strengthening exercises. - Refer to physical therapy.  Gastroparesis with intermittent nausea Intermittent nausea likely related to gastroparesis, with symptoms including nausea upon waking and occasional nocturnal aspiration. - Continue sucralfate .  Refill today. - Prescribe short supply of Zofran  for nausea.  Essential hypertension Chronic, stable.  Hypertension managed with losartan -hydrochlorothiazide without side effects. -  Refill losartan -hydrochlorothiazide prescription.  Vitamin D  deficiency Vitamin D  deficiency managed with supplementation. - Check vitamin D  levels. - Continue vitamin D  supplementation.  Nicotine dependence; smoking cessation counseling Currently smoking two cigarettes per day, willing to quit, prefers to avoid bupropion . - Discuss smoking cessation options including nicotine lozenges or gum. - Consider varenicline if needed.  Morbid obesity associated with hypertension, hyperlipidemia and gastroparesis Counseled on lifestyle modifications.  Depression Chronic, currently stable without medication.      Return in about 1 year (around 11/16/2024) for CPE.     I discussed the assessment and treatment plan with the patient  The patient was provided an opportunity to ask questions and all were answered. The patient agreed with the plan and demonstrated an understanding of the instructions.   The patient was advised to call back or seek an in-person evaluation if the symptoms worsen or if the condition fails to improve as anticipated.    LAURAINE LOISE BUOY, DO  [facility] [phone] (phone) [phone] (fax)  [facility]

## 2023-11-18 ENCOUNTER — Ambulatory Visit: Payer: Self-pay | Admitting: Family Medicine

## 2023-11-18 ENCOUNTER — Encounter: Payer: Self-pay | Admitting: Family Medicine

## 2023-11-18 DIAGNOSIS — E559 Vitamin D deficiency, unspecified: Secondary | ICD-10-CM

## 2023-11-18 DIAGNOSIS — R222 Localized swelling, mass and lump, trunk: Secondary | ICD-10-CM

## 2023-11-18 LAB — COMPREHENSIVE METABOLIC PANEL WITH GFR
ALT: 31 IU/L (ref 0–32)
AST: 20 IU/L (ref 0–40)
Albumin: 4.4 g/dL (ref 3.9–4.9)
Alkaline Phosphatase: 61 IU/L (ref 44–121)
BUN/Creatinine Ratio: 9 (ref 9–23)
BUN: 10 mg/dL (ref 6–24)
Bilirubin Total: 0.3 mg/dL (ref 0.0–1.2)
CO2: 24 mmol/L (ref 20–29)
Calcium: 9.6 mg/dL (ref 8.7–10.2)
Chloride: 99 mmol/L (ref 96–106)
Creatinine, Ser: 1.07 mg/dL — ABNORMAL HIGH (ref 0.57–1.00)
Globulin, Total: 2.3 g/dL (ref 1.5–4.5)
Glucose: 106 mg/dL — ABNORMAL HIGH (ref 70–99)
Potassium: 3.5 mmol/L (ref 3.5–5.2)
Sodium: 141 mmol/L (ref 134–144)
Total Protein: 6.7 g/dL (ref 6.0–8.5)
eGFR: 67 mL/min/1.73 (ref >59)

## 2023-11-18 LAB — HEMOGLOBIN A1C
Est. average glucose Bld gHb Est-mCnc: 131 mg/dL
Hgb A1c MFr Bld: 6.2 % — ABNORMAL HIGH (ref 4.8–5.6)

## 2023-11-18 LAB — LIPID PANEL
Chol/HDL Ratio: 3.7 ratio (ref 0.0–4.4)
Cholesterol, Total: 180 mg/dL (ref 100–199)
HDL: 49 mg/dL (ref >39)
LDL Chol Calc (NIH): 106 mg/dL — ABNORMAL HIGH (ref 0–99)
Triglycerides: 143 mg/dL (ref 0–149)
VLDL Cholesterol Cal: 25 mg/dL (ref 5–40)

## 2023-11-18 LAB — VITAMIN D 25 HYDROXY (VIT D DEFICIENCY, FRACTURES): Vit D, 25-Hydroxy: 42.2 ng/mL (ref 30.0–100.0)

## 2023-11-18 LAB — MICROALBUMIN / CREATININE URINE RATIO
Creatinine, Urine: 106.5 mg/dL
Microalb/Creat Ratio: <3 mg/g{creat} (ref 0–29)
Microalbumin, Urine: <3 ug/mL

## 2023-11-18 LAB — VITAMIN B12: Vitamin B-12: 344 pg/mL (ref 232–1245)

## 2023-11-24 ENCOUNTER — Other Ambulatory Visit

## 2023-11-25 ENCOUNTER — Other Ambulatory Visit

## 2023-11-30 ENCOUNTER — Ambulatory Visit
Admission: RE | Admit: 2023-11-30 | Discharge: 2023-11-30 | Disposition: A | Discharge status: Home / Self Care | Source: Ambulatory Visit | Attending: Family Medicine | Admitting: Family Medicine

## 2023-11-30 DIAGNOSIS — R222 Localized swelling, mass and lump, trunk: Secondary | ICD-10-CM | POA: Insufficient documentation

## 2023-12-20 ENCOUNTER — Telehealth: Payer: Self-pay

## 2023-12-20 NOTE — Telephone Encounter (Signed)
 Copied from CRM #[id number]. Topic: Referral - Prior Authorization Question >> Dec 20, 2023  8:56 AM Amy B wrote: Reason for CRM: Patient has a CT chest scheduled on 12/22/2023  8:00 AM at [facility].  They have not received the authorization.  Please call Kylee at [phone]

## 2023-12-22 ENCOUNTER — Ambulatory Visit: Admission: RE | Admit: 2023-12-22 | Source: Ambulatory Visit

## 2023-12-24 ENCOUNTER — Ambulatory Visit: Admission: RE | Admit: 2023-12-24

## 2023-12-24 NOTE — Telephone Encounter (Unsigned)
 Copied from CRM #[id number]. Topic: Clinical - Request for Lab/Test Order >> Dec 22, 2023  1:44 PM Santiya F wrote: Reason for CRM: Kylie with [facility] is calling in because they received orders for 2 CT scans for patient but only one of them was authorized. She is requesting to speak with someone regarding this. Please follow up with Kylie [phone] Ext [phone]

## 2023-12-28 ENCOUNTER — Telehealth: Payer: Self-pay | Admitting: Family Medicine

## 2023-12-28 NOTE — Telephone Encounter (Unsigned)
 Copied from CRM #[id number]. Topic: Clinical - Request for Lab/Test Order >> Dec 28, 2023  9:57 AM Donee H wrote: Reason for CRM: Patient called regarding Ct that was ordered. She stated denied by insurance originally . She was calling to check status and see if it has been authorized now so she can get test done. Please follow up with patient. [phone]

## 2024-01-03 ENCOUNTER — Encounter: Payer: Self-pay | Admitting: Family Medicine

## 2024-01-04 MED ORDER — VITAMIN D (ERGOCALCIFEROL) 1.25 MG (50000 UNIT) PO CAPS
50000.0000 [IU] | ORAL_CAPSULE | ORAL | 3 refills | Status: AC
Start: 2024-01-04 — End: ?

## 2024-01-04 NOTE — Telephone Encounter (Signed)
 Copied from CRM [id number]. Topic: Clinical - Lab/Test Results >> Jan 04, 2024 11:18 AM Jo Soto wrote: Reason for CRM: [facility] called.. said patient needs auth for the CT scan.. she has it for the abdomen and pelvis but she needs one for the chest.. the CPT code: 28739.Jo Soto Her appt is tomorrow.. Tania: [phone] ext [phone] if you need to call her back

## 2024-01-05 ENCOUNTER — Ambulatory Visit: Admission: RE | Admit: 2024-01-05 | Source: Ambulatory Visit
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
## 2024-01-18 ENCOUNTER — Encounter: Payer: Self-pay | Admitting: Physical Therapy

## 2024-01-18 ENCOUNTER — Ambulatory Visit: Attending: Family Medicine | Admitting: Physical Therapy

## 2024-01-18 DIAGNOSIS — G8929 Other chronic pain: Secondary | ICD-10-CM | POA: Diagnosis not present

## 2024-01-18 DIAGNOSIS — M545 Low back pain, unspecified: Secondary | ICD-10-CM | POA: Insufficient documentation

## 2024-01-18 DIAGNOSIS — M5459 Other low back pain: Secondary | ICD-10-CM | POA: Diagnosis present

## 2024-01-18 DIAGNOSIS — M792 Neuralgia and neuritis, unspecified: Secondary | ICD-10-CM | POA: Insufficient documentation

## 2024-01-24 ENCOUNTER — Ambulatory Visit: Attending: Family Medicine | Admitting: Physical Therapy

## 2024-01-24 ENCOUNTER — Encounter: Payer: Self-pay | Admitting: Physical Therapy

## 2024-01-24 VITALS — BP 103/71 | HR 88

## 2024-01-24 DIAGNOSIS — M792 Neuralgia and neuritis, unspecified: Secondary | ICD-10-CM | POA: Diagnosis present

## 2024-01-24 DIAGNOSIS — M5459 Other low back pain: Secondary | ICD-10-CM | POA: Diagnosis present
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
## 2024-01-31 ENCOUNTER — Ambulatory Visit
Admission: RE | Admit: 2024-01-31 | Discharge: 2024-01-31 | Disposition: A | Discharge status: Home / Self Care | Source: Ambulatory Visit | Attending: Family Medicine | Admitting: Family Medicine

## 2024-01-31 DIAGNOSIS — R222 Localized swelling, mass and lump, trunk: Secondary | ICD-10-CM | POA: Diagnosis present

## 2024-01-31 MED ORDER — IOHEXOL 300 MG/ML  SOLN
100.0000 mL | Freq: Once | INTRAMUSCULAR | Status: AC | PRN
Start: 2024-01-31 — End: 2024-01-31
  Administered 2024-01-31: 100 mL via INTRAVENOUS

## 2024-02-01 ENCOUNTER — Ambulatory Visit

## 2024-02-01 ENCOUNTER — Encounter: Payer: Self-pay | Admitting: Physical Therapy

## 2024-02-01 DIAGNOSIS — M5459 Other low back pain: Secondary | ICD-10-CM | POA: Diagnosis not present

## 2024-02-01 DIAGNOSIS — M792 Neuralgia and neuritis, unspecified: Secondary | ICD-10-CM
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
## 2024-02-03 ENCOUNTER — Ambulatory Visit

## 2024-02-03 ENCOUNTER — Encounter: Payer: Self-pay | Admitting: Physical Therapy

## 2024-02-03 DIAGNOSIS — M5459 Other low back pain: Secondary | ICD-10-CM | POA: Diagnosis not present

## 2024-02-03 DIAGNOSIS — M792 Neuralgia and neuritis, unspecified: Secondary | ICD-10-CM
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
## 2024-02-05 ENCOUNTER — Ambulatory Visit: Payer: Self-pay | Admitting: Family Medicine

## 2024-02-08 ENCOUNTER — Ambulatory Visit: Admitting: Physical Therapy

## 2024-02-08 DIAGNOSIS — M5459 Other low back pain: Secondary | ICD-10-CM | POA: Diagnosis not present

## 2024-02-08 DIAGNOSIS — M792 Neuralgia and neuritis, unspecified: Secondary | ICD-10-CM
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
## 2024-02-09 ENCOUNTER — Encounter: Payer: Self-pay | Admitting: Physical Therapy
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
## 2024-02-10 ENCOUNTER — Ambulatory Visit: Admitting: Physical Therapy

## 2024-02-10 DIAGNOSIS — M792 Neuralgia and neuritis, unspecified: Secondary | ICD-10-CM

## 2024-02-10 DIAGNOSIS — M5459 Other low back pain: Secondary | ICD-10-CM

## 2024-02-14 ENCOUNTER — Ambulatory Visit: Admitting: Physical Therapy

## 2024-02-14 DIAGNOSIS — M5459 Other low back pain: Secondary | ICD-10-CM | POA: Diagnosis not present

## 2024-02-14 DIAGNOSIS — M792 Neuralgia and neuritis, unspecified: Secondary | ICD-10-CM
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
## 2024-02-22 ENCOUNTER — Telehealth: Payer: Self-pay | Admitting: Physical Therapy

## 2024-02-22 ENCOUNTER — Ambulatory Visit: Admitting: Physical Therapy

## 2024-02-22 DIAGNOSIS — M5459 Other low back pain: Secondary | ICD-10-CM

## 2024-02-22 DIAGNOSIS — M792 Neuralgia and neuritis, unspecified: Secondary | ICD-10-CM

## 2024-02-22 NOTE — Telephone Encounter (Signed)
 Called patient due to no show and pt stated they had called in early to try to reschedule to a later time but we were full so she decided to cancel. Pt does not get off work until [employer] and is unable to make an early time. Canceled her appointment on Thursday as well since it was at the same time.  Vernell Rise, SPT 02/22/24  [facility] [address] [place], [place] [address] P: [phone] I F: [phone]

## 2024-02-24 ENCOUNTER — Ambulatory Visit: Admitting: Physical Therapy

## 2024-02-28 ENCOUNTER — Telehealth: Payer: Self-pay | Admitting: Physical Therapy

## 2024-02-28 NOTE — Telephone Encounter (Signed)
 Called to inform patient we were able to move her appointment from 4:45pm tomorrow to 6:15pm  Vernell Rise, SPT 02/28/24  [facility] [address] [place], [place] [address] P: [phone] I F: [phone]
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
## 2024-02-29 ENCOUNTER — Ambulatory Visit: Admitting: Physical Therapy

## 2024-02-29 DIAGNOSIS — M792 Neuralgia and neuritis, unspecified: Secondary | ICD-10-CM | POA: Insufficient documentation

## 2024-02-29 DIAGNOSIS — M5459 Other low back pain: Secondary | ICD-10-CM | POA: Insufficient documentation
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
## 2024-03-02 ENCOUNTER — Ambulatory Visit: Admitting: Physical Therapy

## 2024-03-02 DIAGNOSIS — M792 Neuralgia and neuritis, unspecified: Secondary | ICD-10-CM

## 2024-03-02 DIAGNOSIS — M5459 Other low back pain: Secondary | ICD-10-CM | POA: Diagnosis not present

## 2024-03-06 ENCOUNTER — Telehealth: Admitting: Physician Assistant

## 2024-03-06 DIAGNOSIS — J069 Acute upper respiratory infection, unspecified: Secondary | ICD-10-CM

## 2024-03-06 MED ORDER — IPRATROPIUM BROMIDE 0.03 % NA SOLN: 2.0000 | Freq: Two times a day (BID) | NASAL | 0 refills | Status: AC

## 2024-03-06 MED ORDER — PREDNISONE 20 MG PO TABS: 40.0000 mg | ORAL_TABLET | Freq: Every day | ORAL | 0 refills | Status: AC

## 2024-03-06 MED ORDER — PROMETHAZINE-DM 6.25-15 MG/5ML PO SYRP: 5.0000 mL | ORAL_SOLUTION | Freq: Four times a day (QID) | ORAL | 0 refills | Status: AC | PRN

## 2024-03-06 MED ORDER — ALBUTEROL SULFATE HFA 108 (90 BASE) MCG/ACT IN AERS: 1.0000 | INHALATION_SPRAY | Freq: Four times a day (QID) | RESPIRATORY_TRACT | 0 refills | Status: AC | PRN

## 2024-03-06 NOTE — Patient Instructions (Signed)
 Jo Soto, thank you for joining Delon CHRISTELLA Dickinson, PA-C for today's virtual visit.  While this provider is not your primary care provider (PCP), if your PCP is located in our provider database this encounter information will be shared with them immediately following your visit.   A [facility] MyChart account gives you access to today's visit and all your visits, tests, and labs performed at [facility]  click here if you don't have a [facility] MyChart account or go to [email or web address]  Consent: (Patient) Jo Soto provided verbal consent for this virtual visit at the beginning of the encounter.  Current Medications:  Current Outpatient Medications:    albuterol  (VENTOLIN  HFA) 108 (90 Base) MCG/ACT inhaler, Inhale 1-2 puffs into the lungs every 6 (six) hours as needed., Disp: 8 g, Rfl: 0   ipratropium (ATROVENT ) 0.03 % nasal spray, Place 2 sprays into both nostrils every 12 (twelve) hours., Disp: 30 mL, Rfl: 0   predniSONE  (DELTASONE ) 20 MG tablet, Take 2 tablets (40 mg total) by mouth daily with breakfast., Disp: 10 tablet, Rfl: 0   promethazine -dextromethorphan (PROMETHAZINE -DM) 6.25-15 MG/5ML syrup, Take 5 mLs by mouth 4 (four) times daily as needed., Disp: 118 mL, Rfl: 0   levonorgestrel  (MIRENA ) 20 MCG/DAY IUD, 1 each by Intrauterine route once., Disp: , Rfl:    losartan -hydrochlorothiazide (HYZAAR) 100-25 MG tablet, Take 1 tablet by mouth daily., Disp: 90 tablet, Rfl: 3   Multiple Vitamin (MULTIVITAMIN WITH MINERALS) TABS tablet, Take 1 tablet by mouth in the morning., Disp: , Rfl:    ondansetron  (ZOFRAN -ODT) 4 MG disintegrating tablet, Take 1 tablet (4 mg total) by mouth every 8 (eight) hours as needed for nausea or vomiting., Disp: 20 tablet, Rfl: 1   sucralfate  (CARAFATE ) 1 g tablet, Take 1 tablet (1 g total) by mouth 4 (four) times daily -  with meals and at bedtime., Disp: 120 tablet, Rfl: 11   tiZANidine  (ZANAFLEX ) 2 MG tablet, Take 1 tablet (2  mg total) by mouth every 6 (six) hours as needed for muscle spasms., Disp: 30 tablet, Rfl: 1   Vitamin D , Ergocalciferol , (DRISDOL ) 1.25 MG (50000 UNIT) CAPS capsule, Take 1 capsule (50,000 Units total) by mouth every 7 (seven) days., Disp: 12 capsule, Rfl: 3   Medications ordered in this encounter:  Meds ordered this encounter  Medications   ipratropium (ATROVENT ) 0.03 % nasal spray    Sig: Place 2 sprays into both nostrils every 12 (twelve) hours.    Dispense:  30 mL    Refill:  0    Supervising Provider:   BLAISE ALEENE KIDD [id number]   predniSONE  (DELTASONE ) 20 MG tablet    Sig: Take 2 tablets (40 mg total) by mouth daily with breakfast.    Dispense:  10 tablet    Refill:  0    Supervising Provider:   LAMPTEY, PHILIP O [[id number]]   albuterol  (VENTOLIN  HFA) 108 (90 Base) MCG/ACT inhaler    Sig: Inhale 1-2 puffs into the lungs every 6 (six) hours as needed.    Dispense:  8 g    Refill:  0    Supervising Provider:   BLAISE ALEENE KIDD [[id number]]   promethazine -dextromethorphan (PROMETHAZINE -DM) 6.25-15 MG/5ML syrup    Sig: Take 5 mLs by mouth 4 (four) times daily as needed.    Dispense:  118 mL    Refill:  0    Supervising Provider:   BLAISE ALEENE KIDD [id number]     *If you need refills on other medications prior to  your next appointment, please contact your pharmacy*  Follow-Up: Call back or seek an in-person evaluation if the symptoms worsen or if the condition fails to improve as anticipated.  [facility] [phone]  Other Instructions  Viral Respiratory Infection A respiratory infection is an illness that affects part of the respiratory system, such as the lungs, nose, or throat. A respiratory infection that is caused by a virus is called a viral respiratory infection. Common types of viral respiratory infections include: A cold. The flu (influenza). A respiratory syncytial virus (RSV) infection. What are the causes? This condition is caused by a virus.  The virus may spread through contact with droplets or direct contact with infected people or their mucus or secretions. The virus may spread from person to person (is contagious). What are the signs or symptoms? Symptoms of this condition include: A stuffy or runny nose. A sore throat or cough. Shortness of breath or difficulty breathing. Yellow or green mucus (sputum). Other symptoms may include: A fever. Sweating or chills. Fatigue. Achy muscles. A headache. How is this diagnosed? This condition may be diagnosed based on: Your symptoms. A physical exam. Testing of secretions from the nose or throat. Chest X-ray. How is this treated? This condition may be treated with medicines, such as: Antiviral medicine. This may shorten the length of time a person has symptoms. Expectorants. These make it easier to cough up mucus. Decongestant nasal sprays. Acetaminophen  or NSAIDs, such as ibuprofen , to relieve fever and pain. Antibiotic medicines are not prescribed for viral infections.This is because antibiotics are designed to kill bacteria. They do not kill viruses. Follow these instructions at home: Managing pain and congestion Take over-the-counter and prescription medicines only as told by your health care provider. If you have a sore throat, gargle with a mixture of salt and water 3-4 times a day or as needed. To make salt water, completely dissolve -1 tsp (3-6 g) of salt in 1 cup (237 mL) of warm water. Use nose drops made from salt water to ease congestion and soften raw skin around your nose. Take 2 tsp (10 mL) of honey at bedtime to lessen coughing at night. Do not give honey to children who are younger than 1 year. Drink enough fluid to keep your urine pale yellow. This helps prevent dehydration and helps loosen up mucus. General instructions  Rest as much as possible. Do not drink alcohol. Do not use any products that contain nicotine or tobacco. These products include  cigarettes, chewing tobacco, and vaping devices, such as e-cigarettes. If you need help quitting, ask your health care provider. Keep all follow-up visits. This is important. How is this prevented?     Get an annual flu shot. You may get the flu shot in late summer, fall, or winter. Ask your health care provider when you should get your flu shot. Avoid spreading your infection to other people. If you are sick: Wash your hands with soap and water often, especially after you cough or sneeze. Wash for at least 20 seconds. If soap and water are not available, use alcohol-based hand sanitizer. Cover your mouth when you cough. Cover your nose and mouth when you sneeze. Do not share cups or eating utensils. Clean commonly used objects often. Clean commonly touched surfaces. Stay home from work or school as told by your health care provider. Avoid contact with people who are sick during cold and flu season. This is generally fall and winter. Contact a health care provider  if: Your symptoms last for 10 days or longer. Your symptoms get worse over time. You have severe sinus pain in your face or forehead. The glands in your jaw or neck become very swollen. You have shortness of breath. Get help right away if you: Feel pain or pressure in your chest. Have trouble breathing. Faint or feel like you will faint. Have severe and persistent vomiting. Feel confused or disoriented. These symptoms may represent a serious problem that is an emergency. Do not wait to see if the symptoms will go away. Get medical help right away. Call your local emergency services (911 in the [place] Do not drive yourself to the hospital. Summary A respiratory infection is an illness that affects part of the respiratory system, such as the lungs, nose, or throat. A respiratory infection that is caused by a virus is called a viral respiratory infection. Common types of viral respiratory infections include a cold, influenza, and  respiratory syncytial virus (RSV) infection. Symptoms of this condition include a stuffy or runny nose, cough, fatigue, achy muscles, sore throat, and fevers or chills. Antibiotic medicines are not prescribed for viral infections. This is because antibiotics are designed to kill bacteria. They are not effective against viruses. This information is not intended to replace advice given to you by your health care provider. Make sure you discuss any questions you have with your health care provider. Document Revised: 06/13/2020 Document Reviewed: 06/13/2020 Elsevier Patient Education  2024 [employer]   If you have been instructed to have an in-person evaluation today at a local Urgent Care facility, please use the link below. It will take you to a list of all of our available [facility], including address, phone number and hours of operation. Please do not delay care.  [facility]  If you or a family member do not have a primary care provider, use the link below to schedule a visit and establish care. When you choose a [facility] primary care physician or advanced practice provider, you gain a long-term partner in health. Find a Primary Care Provider  Learn more about [facility]'s in-office and virtual care options: [facility] Get Care Now

## 2024-03-06 NOTE — Progress Notes (Signed)
 Virtual Visit Consent   Jo Soto, you are scheduled for a virtual visit with a [facility] provider today. Just as with appointments in the office, your consent must be obtained to participate. Your consent will be active for this visit and any virtual visit you may have with one of our providers in the next 365 days. If you have a MyChart account, a copy of this consent can be sent to you electronically.  As this is a virtual visit, video technology does not allow for your provider to perform a traditional examination. This may limit your provider's ability to fully assess your condition. If your provider identifies any concerns that need to be evaluated in person or the need to arrange testing (such as labs, EKG, etc.), we will make arrangements to do so. Although advances in technology are sophisticated, we cannot ensure that it will always work on either your end or our end. If the connection with a video visit is poor, the visit may have to be switched to a telephone visit. With either a video or telephone visit, we are not always able to ensure that we have a secure connection.  By engaging in this virtual visit, you consent to the provision of healthcare and authorize for your insurance to be billed (if applicable) for the services provided during this visit. Depending on your insurance coverage, you may receive a charge related to this service.  I need to obtain your verbal consent now. Are you willing to proceed with your visit today? Jo Soto has provided verbal consent on 03/06/2024 for a virtual visit (video or telephone). Jo CHRISTELLA Dickinson, PA-C  Date: 03/06/2024 12:17 PM   Virtual Visit via Video Note   I, Jo Soto, connected with  Jo Soto  ([id number], [birth year]) on 03/06/2024 at 12:00 PM EST by a video-enabled telemedicine application and verified that I am speaking with the correct person using two identifiers.  Location: Patient: Virtual Visit  Location Patient: Home Provider: Virtual Visit Location Provider: Home Office   I discussed the limitations of evaluation and management by telemedicine and the availability of in person appointments. The patient expressed understanding and agreed to proceed.    History of Present Illness: Jo Soto is a 43 y.o. who identifies as a female who was assigned female at birth, and is being seen today for congestion, hoarse voice.  HPI: URI  This is a new problem. The current episode started yesterday. The problem has been unchanged. There has been no fever. Associated symptoms include congestion, coughing (productive of mucus), ear pain (right), headaches, rhinorrhea (and post nasal drainage), sinus pain, a sore throat (mild) and wheezing. Pertinent negatives include no diarrhea, nausea, plugged ear sensation or vomiting. Associated symptoms comments: Loss of voice and hoarse, mild SOB. Treatments tried: Nyquil, hot liquids, dayquil. The treatment provided no relief.     Problems:  Patient Active Problem List   Diagnosis Date Noted   Tobacco dependence 01/28/2023   Screening for human papillomavirus (HPV) 01/28/2023   Moderate mixed hyperlipidemia not requiring statin therapy 08/12/2022   Gastroparesis 08/12/2022   Chronic bilateral low back pain without sciatica 07/30/2022   OSA on CPAP 01/19/2022   Need for influenza vaccination 01/19/2022   Annual physical exam 01/19/2022   Prediabetes 01/19/2022   Vitamin D  deficiency 01/19/2022   Personal history of nicotine dependence 01/19/2022   Morbid obesity (HCC) 09/01/2021   Primary hypertension 09/01/2021   Depression, recurrent 07/21/2021    Allergies: Allergies[1] Medications: Current  Medications[2]  Observations/Objective: Patient is well-developed, well-nourished in no acute distress.  Resting comfortably at home.  Head is normocephalic, atraumatic.  No labored breathing.  Speech is clear and coherent with logical content.   Patient is alert and oriented at baseline.    Assessment and Plan: 1. Viral URI with cough (Primary) - ipratropium (ATROVENT ) 0.03 % nasal spray; Place 2 sprays into both nostrils every 12 (twelve) hours.  Dispense: 30 mL; Refill: 0 - predniSONE  (DELTASONE ) 20 MG tablet; Take 2 tablets (40 mg total) by mouth daily with breakfast.  Dispense: 10 tablet; Refill: 0 - albuterol  (VENTOLIN  HFA) 108 (90 Base) MCG/ACT inhaler; Inhale 1-2 puffs into the lungs every 6 (six) hours as needed.  Dispense: 8 g; Refill: 0 - promethazine -dextromethorphan (PROMETHAZINE -DM) 6.25-15 MG/5ML syrup; Take 5 mLs by mouth 4 (four) times daily as needed.  Dispense: 118 mL; Refill: 0  - Suspect viral URI - Symptomatic medications of choice over the counter as needed - Add Prednisone  for laryngitis, wheezing, and congestion - Add Promethazine  DM for cough - Add Ipratropium bromide  nasal spray for drainage - Add Albuterol  for wheezing and chest tightness - Push fluids - Rest - Seek further evaluation if symptoms change or worsen   Follow Up Instructions: I discussed the assessment and treatment plan with the patient. The patient was provided an opportunity to ask questions and all were answered. The patient agreed with the plan and demonstrated an understanding of the instructions.  A copy of instructions were sent to the patient via MyChart unless otherwise noted below.    The patient was advised to call back or seek an in-person evaluation if the symptoms worsen or if the condition fails to improve as anticipated.    Jo HERO Nimrat Woolworth, PA-C     [1] No Known Allergies [2]  Current Outpatient Medications:    albuterol  (VENTOLIN  HFA) 108 (90 Base) MCG/ACT inhaler, Inhale 1-2 puffs into the lungs every 6 (six) hours as needed., Disp: 8 g, Rfl: 0   ipratropium (ATROVENT ) 0.03 % nasal spray, Place 2 sprays into both nostrils every 12 (twelve) hours., Disp: 30 mL, Rfl: 0   predniSONE  (DELTASONE ) 20 MG tablet,  Take 2 tablets (40 mg total) by mouth daily with breakfast., Disp: 10 tablet, Rfl: 0   promethazine -dextromethorphan (PROMETHAZINE -DM) 6.25-15 MG/5ML syrup, Take 5 mLs by mouth 4 (four) times daily as needed., Disp: 118 mL, Rfl: 0   levonorgestrel  (MIRENA ) 20 MCG/DAY IUD, 1 each by Intrauterine route once., Disp: , Rfl:    losartan -hydrochlorothiazide (HYZAAR) 100-25 MG tablet, Take 1 tablet by mouth daily., Disp: 90 tablet, Rfl: 3   Multiple Vitamin (MULTIVITAMIN WITH MINERALS) TABS tablet, Take 1 tablet by mouth in the morning., Disp: , Rfl:    ondansetron  (ZOFRAN -ODT) 4 MG disintegrating tablet, Take 1 tablet (4 mg total) by mouth every 8 (eight) hours as needed for nausea or vomiting., Disp: 20 tablet, Rfl: 1   sucralfate  (CARAFATE ) 1 g tablet, Take 1 tablet (1 g total) by mouth 4 (four) times daily -  with meals and at bedtime., Disp: 120 tablet, Rfl: 11   tiZANidine  (ZANAFLEX ) 2 MG tablet, Take 1 tablet (2 mg total) by mouth every 6 (six) hours as needed for muscle spasms., Disp: 30 tablet, Rfl: 1   Vitamin D , Ergocalciferol , (DRISDOL ) 1.25 MG (50000 UNIT) CAPS capsule, Take 1 capsule (50,000 Units total) by mouth every 7 (seven) days., Disp: 12 capsule, Rfl: 3
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
## 2024-03-07 ENCOUNTER — Ambulatory Visit: Admitting: Physical Therapy

## 2024-03-09 ENCOUNTER — Ambulatory Visit: Admitting: Physical Therapy

## 2024-03-14 ENCOUNTER — Ambulatory Visit: Admitting: Physical Therapy

## 2024-03-20 ENCOUNTER — Ambulatory Visit: Admitting: Physical Therapy

## 2024-03-22 ENCOUNTER — Ambulatory Visit: Admitting: Physical Therapy

## 2024-03-27 ENCOUNTER — Other Ambulatory Visit: Payer: Self-pay | Admitting: Family Medicine

## 2024-03-27 DIAGNOSIS — E559 Vitamin D deficiency, unspecified: Secondary | ICD-10-CM

## 2024-03-27 NOTE — Telephone Encounter (Signed)
 LOV- 11/17/2023 NOV- None [facility]- 01/04/2024 Outpatient Medication Detail   Disp Refills Start End   Vitamin D , Ergocalciferol , (DRISDOL ) 1.25 MG (50000 UNIT) CAPS capsule 12 capsule 3 01/04/2024 --   Sig - Route: Take 1 capsule (50,000 Units total) by mouth every 7 (seven) days. - Oral   Sent to pharmacy as: Vitamin D , Ergocalciferol , (DRISDOL ) 1.25 MG (50000 UNIT) Cap capsule   E-Prescribing Status: Receipt confirmed by pharmacy (01/04/2024  7:52 AM EDT)
